# Patient Record
Sex: Female | Born: 1935 | Race: Black or African American | Hispanic: No | Smoking: Never smoker
Health system: Southern US, Community
[De-identification: ages and names within clinical notes are randomized; demographics above are authoritative.]

## PROBLEM LIST (undated history)

## (undated) DIAGNOSIS — I639 Cerebral infarction, unspecified: Secondary | ICD-10-CM

## (undated) DIAGNOSIS — Z950 Presence of cardiac pacemaker: Secondary | ICD-10-CM

## (undated) DIAGNOSIS — F0390 Unspecified dementia without behavioral disturbance: Secondary | ICD-10-CM

## (undated) DIAGNOSIS — I219 Acute myocardial infarction, unspecified: Secondary | ICD-10-CM

## (undated) HISTORY — PX: OTHER SURGICAL HISTORY: SHX169

## (undated) HISTORY — PX: TUBAL LIGATION: SHX77

## (undated) HISTORY — PX: ABDOMINAL HYSTERECTOMY: SHX81

## (undated) HISTORY — PX: PACEMAKER INSERTION: SHX728

---

## 2004-10-20 ENCOUNTER — Ambulatory Visit: Payer: Self-pay | Admitting: Internal Medicine

## 2005-11-17 ENCOUNTER — Ambulatory Visit: Payer: Self-pay | Admitting: Internal Medicine

## 2006-08-08 ENCOUNTER — Emergency Department: Payer: Self-pay | Admitting: Emergency Medicine

## 2006-11-21 ENCOUNTER — Ambulatory Visit: Payer: Self-pay | Admitting: Internal Medicine

## 2007-11-27 ENCOUNTER — Ambulatory Visit: Payer: Self-pay | Admitting: Internal Medicine

## 2008-10-13 ENCOUNTER — Inpatient Hospital Stay: Payer: Self-pay | Admitting: Internal Medicine

## 2008-12-18 ENCOUNTER — Ambulatory Visit: Payer: Self-pay | Admitting: Internal Medicine

## 2009-05-31 ENCOUNTER — Inpatient Hospital Stay: Payer: Self-pay | Admitting: Internal Medicine

## 2009-11-10 ENCOUNTER — Inpatient Hospital Stay: Payer: Self-pay | Admitting: Internal Medicine

## 2010-01-31 ENCOUNTER — Inpatient Hospital Stay: Payer: Self-pay | Admitting: Internal Medicine

## 2010-03-24 ENCOUNTER — Ambulatory Visit: Payer: Self-pay | Admitting: Internal Medicine

## 2010-03-26 ENCOUNTER — Ambulatory Visit: Payer: Self-pay | Admitting: Internal Medicine

## 2011-07-08 ENCOUNTER — Ambulatory Visit: Payer: Self-pay | Admitting: Internal Medicine

## 2012-01-10 ENCOUNTER — Encounter (HOSPITAL_COMMUNITY): Payer: Self-pay | Admitting: *Deleted

## 2012-01-10 ENCOUNTER — Emergency Department (HOSPITAL_COMMUNITY): Payer: Medicare Other

## 2012-01-10 ENCOUNTER — Emergency Department (HOSPITAL_COMMUNITY)
Admission: EM | Admit: 2012-01-10 | Discharge: 2012-01-10 | Disposition: A | Discharge status: Home / Self Care | Payer: Medicare Other | Attending: Emergency Medicine | Admitting: Emergency Medicine

## 2012-01-10 DIAGNOSIS — I252 Old myocardial infarction: Secondary | ICD-10-CM | POA: Insufficient documentation

## 2012-01-10 DIAGNOSIS — W19XXXA Unspecified fall, initial encounter: Secondary | ICD-10-CM

## 2012-01-10 DIAGNOSIS — F068 Other specified mental disorders due to known physiological condition: Secondary | ICD-10-CM | POA: Insufficient documentation

## 2012-01-10 DIAGNOSIS — Z79899 Other long term (current) drug therapy: Secondary | ICD-10-CM | POA: Insufficient documentation

## 2012-01-10 DIAGNOSIS — F29 Unspecified psychosis not due to a substance or known physiological condition: Secondary | ICD-10-CM | POA: Insufficient documentation

## 2012-01-10 DIAGNOSIS — R51 Headache: Secondary | ICD-10-CM | POA: Insufficient documentation

## 2012-01-10 DIAGNOSIS — S0083XA Contusion of other part of head, initial encounter: Secondary | ICD-10-CM | POA: Insufficient documentation

## 2012-01-10 DIAGNOSIS — Z950 Presence of cardiac pacemaker: Secondary | ICD-10-CM | POA: Insufficient documentation

## 2012-01-10 DIAGNOSIS — Z8673 Personal history of transient ischemic attack (TIA), and cerebral infarction without residual deficits: Secondary | ICD-10-CM | POA: Insufficient documentation

## 2012-01-10 DIAGNOSIS — S0093XA Contusion of unspecified part of head, initial encounter: Secondary | ICD-10-CM

## 2012-01-10 DIAGNOSIS — E119 Type 2 diabetes mellitus without complications: Secondary | ICD-10-CM | POA: Insufficient documentation

## 2012-01-10 DIAGNOSIS — S0003XA Contusion of scalp, initial encounter: Secondary | ICD-10-CM | POA: Insufficient documentation

## 2012-01-10 DIAGNOSIS — W07XXXA Fall from chair, initial encounter: Secondary | ICD-10-CM | POA: Insufficient documentation

## 2012-01-10 HISTORY — DX: Acute myocardial infarction, unspecified: I21.9

## 2012-01-10 HISTORY — DX: Cerebral infarction, unspecified: I63.9

## 2012-01-10 HISTORY — DX: Presence of cardiac pacemaker: Z95.0

## 2012-01-10 HISTORY — DX: Unspecified dementia, unspecified severity, without behavioral disturbance, psychotic disturbance, mood disturbance, and anxiety: F03.90

## 2012-01-10 NOTE — ED Provider Notes (Signed)
History     CSN: [id number]  Arrival date & time 01/10/12  1237   First MD Initiated Contact with Patient 01/10/12 1256      Chief Complaint  Patient presents with  . Fall    (Consider location/radiation/quality/duration/timing/severity/associated sxs/prior treatment) Patient is a 78 y.o. female presenting with fall. The history is provided by the patient and a relative.  Fall The accident occurred 3 to 5 hours ago. Incident: She fell backward while sitting in a seated walker going over uneven surface.  The point of impact was the head. The pain is present in the head. She was ambulatory at the scene. There was no entrapment after the fall. There was no drug use involved in the accident. There was no alcohol use involved in the accident. Associated symptoms include headaches. Pertinent negatives include no abdominal pain. Associated symptoms comments: Per family member at bedside, she did not lose consciousness and she denies nausea. There was some confusion after the fall that has resolved since arrival in ED and family feels she is at baseline now. She complains of mild residual headache..    Past Medical History  Diagnosis Date  . Dementia   . Stroke   . MI (myocardial infarction)   . Diabetes mellitus   . Pacemaker     History reviewed. No pertinent past surgical history.  No family history on file.  History  Substance Use Topics  . Smoking status: Never Smoker   . Smokeless tobacco: Not on file  . Alcohol Use: No    OB History    Grav Para Term Preterm Abortions TAB SAB Ect Mult Living                  Review of Systems  Constitutional: Negative.   HENT: Negative for neck pain.   Respiratory: Negative for shortness of breath.   Cardiovascular: Negative for chest pain.  Gastrointestinal: Negative for abdominal pain.  Musculoskeletal: Negative for back pain.  Neurological: Positive for headaches.  Psychiatric/Behavioral: Positive for confusion.    Allergies   Review of patient's allergies indicates no known allergies.  Home Medications   Current Outpatient Rx  Name Route Sig Dispense Refill  . AMLODIPINE BESYLATE 5 MG PO TABS Oral Take 5 mg by mouth daily.    Marland Kitchen BRINZOLAMIDE 1 % OP SUSP Both Eyes Place 1 drop into both eyes 2 (two) times daily.    . ASPIRIN-DIPYRIDAMOLE ER 25-200 MG PO CP12 Oral Take 1 capsule by mouth 2 (two) times daily.    Marland Kitchen GABAPENTIN 100 MG PO CAPS Oral Take 100 mg by mouth at bedtime.    Marland Kitchen LISINOPRIL 40 MG PO TABS Oral Take 40 mg by mouth daily.    Marland Kitchen METFORMIN HCL 500 MG PO TABS Oral Take 500 mg by mouth 2 (two) times daily.    Marland Kitchen METOPROLOL SUCCINATE ER 50 MG PO TB24 Oral Take 50 mg by mouth daily. Take with or immediately following a meal.    . OMEPRAZOLE 20 MG PO CPDR Oral Take 20 mg by mouth daily.    Marland Kitchen POTASSIUM CHLORIDE CRYS ER 10 MEQ PO TBCR Oral Take 10 mEq by mouth daily.    Marland Kitchen SIMVASTATIN 20 MG PO TABS Oral Take 20 mg by mouth at bedtime.      BP 128/63  Pulse 77  Temp(Src) 99 F (37.2 C) (Oral)  Resp 11  SpO2 99%  Physical Exam  Constitutional: She is oriented to person, place, and time. She appears  well-developed and well-nourished.  HENT:  Head: Normocephalic.  Eyes: Pupils are equal, round, and reactive to light.  Neck: Normal range of motion. Neck supple.  Cardiovascular: Normal rate and regular rhythm.   No murmur heard. Pulmonary/Chest: Effort normal and breath sounds normal. She has no rales.  Abdominal: Soft. Bowel sounds are normal. There is no tenderness. There is no rebound and no guarding.  Musculoskeletal: Normal range of motion.  Neurological: She is alert and oriented to person, place, and time. She has normal strength and normal reflexes. No sensory deficit. She displays a negative Romberg sign. Coordination normal.       Cranial nerves 3-12 grossly intact.  Skin: Skin is warm and dry. No rash noted.  Psychiatric: She has a normal mood and affect.    ED Course  Procedures  (including critical care time)  Labs Reviewed - No data to display Ct Head Wo Contrast  01/10/2012  *RADIOLOGY REPORT*  Clinical Data: Larey Seat and struck head.  CT HEAD WITHOUT CONTRAST  Technique:  Contiguous axial images were obtained from the base of the skull through the vertex without contrast.  Comparison: No comparison studies available.  Findings: There is no evidence for acute hemorrhage, hydrocephalus, mass lesion, or abnormal extra-axial fluid collection.  No definite CT evidence for acute infarction.  Patchy low attenuation in the deep hemispheric and periventricular white matter is nonspecific, but likely reflects chronic microvascular ischemic demyelination.  Air-fluid level is identified in the left maxillary sinus.  IMPRESSION: No acute intracranial abnormality.  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  Air-fluid level of the left maxillary sinus.  This may be secondary to acute sinusitis.  Hemorrhage would also be a consideration.  No fracture is apparent within the visualized portion of the left maxillary sinus.  Original Report Authenticated By: ERIC A. MANSELL, M.D.     No diagnosis found. 1. Contusion, head 2. Fall    MDM  Patient remains oriented without development of new symptoms, in the setting of a normal neuro exam and a negative head CT. Feel injury is minimal and she can be discharged home at this time.         Rodena Medin, PA-C 01/10/12 1520

## 2012-01-10 NOTE — Discharge Instructions (Signed)
FOLLOW UP WITH YOUR DOCTOR FOR RECHECK AS NEEDED. TYLENOL FOR ANY DISCOMFORT. RETURN HERE WITH ANY NEW OR CONCERNING SYMPTOMS.  Contusion A contusion is a deep bruise. Contusions are the result of an injury that caused bleeding under the skin. The contusion may turn blue, purple, or yellow. Minor injuries will give you a painless contusion, but more severe contusions may stay painful and swollen for a few weeks.  CAUSES  A contusion is usually caused by a blow, trauma, or direct force to an area of the body. SYMPTOMS   Swelling and redness of the injured area.   Bruising of the injured area.   Tenderness and soreness of the injured area.   Pain.  DIAGNOSIS  The diagnosis can be made by taking a history and physical exam. An X-ray, CT scan, or MRI may be needed to determine if there were any associated injuries, such as fractures. TREATMENT  Specific treatment will depend on what area of the body was injured. In general, the best treatment for a contusion is resting, icing, elevating, and applying cold compresses to the injured area. Over-the-counter medicines may also be recommended for pain control. Ask your caregiver what the best treatment is for your contusion. HOME CARE INSTRUCTIONS   Put ice on the injured area.   Put ice in a plastic bag.   Place a towel between your skin and the bag.   Leave the ice on for 15 to 20 minutes, 3 to 4 times a day.   Only take over-the-counter or prescription medicines for pain, discomfort, or fever as directed by your caregiver. Your caregiver may recommend avoiding anti-inflammatory medicines (aspirin, ibuprofen, and naproxen) for 48 hours because these medicines may increase bruising.   Rest the injured area.   If possible, elevate the injured area to reduce swelling.  SEEK IMMEDIATE MEDICAL CARE IF:   You have increased bruising or swelling.   You have pain that is getting worse.   Your swelling or pain is not relieved with medicines.    MAKE SURE YOU:   Understand these instructions.   Will watch your condition.   Will get help right away if you are not doing well or get worse.  Document Released: 06/09/2005 Document Revised: 08/19/2011 Document Reviewed: 07/05/2011 [facility] Patient Information 2012 [place], [place].

## 2012-01-10 NOTE — ED Notes (Signed)
Patient transported to CT 

## 2012-01-10 NOTE — ED Notes (Signed)
MD at bedside. 

## 2012-01-10 NOTE — ED Provider Notes (Signed)
Medical screening examination/treatment/procedure(s) were performed by non-physician practitioner and as supervising physician I was immediately available for consultation/collaboration.   Nic Lampe A Fortunato Nordin, MD 01/10/12 1618 
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
## 2012-08-27 ENCOUNTER — Emergency Department: Payer: Self-pay | Admitting: Emergency Medicine

## 2012-08-27 LAB — COMPREHENSIVE METABOLIC PANEL
BUN: 10 mg/dL (ref 7–18)
Bilirubin,Total: 0.5 mg/dL (ref 0.2–1.0)
Chloride: 109 mmol/L — ABNORMAL HIGH (ref 98–107)
Co2: 28 mmol/L (ref 21–32)
Creatinine: 0.78 mg/dL (ref 0.60–1.30)
EGFR (African American): >60
EGFR (Non-African Amer.): >60
Osmolality: 284 (ref 275–301)
Sodium: 142 mmol/L (ref 136–145)
Total Protein: 8.7 g/dL — ABNORMAL HIGH (ref 6.4–8.2)

## 2012-08-27 LAB — CBC
MCH: 31.3 pg (ref 26.0–34.0)
MCV: 93 fL (ref 80–100)
Platelet: 375 10*3/uL (ref 150–440)
RDW: 12.7 % (ref 11.5–14.5)
WBC: 6.2 10*3/uL (ref 3.6–11.0)

## 2012-08-27 LAB — URINALYSIS, COMPLETE
Blood: NEGATIVE
Glucose,UR: NEGATIVE mg/dL (ref 0–75)
Nitrite: NEGATIVE
Ph: 5 (ref 4.5–8.0)
Specific Gravity: 1.008 (ref 1.003–1.030)
Squamous Epithelial: 11

## 2013-01-22 ENCOUNTER — Ambulatory Visit: Payer: Self-pay | Admitting: Internal Medicine

## 2013-03-05 ENCOUNTER — Ambulatory Visit: Payer: Self-pay | Admitting: Internal Medicine

## 2015-02-17 ENCOUNTER — Other Ambulatory Visit: Payer: Self-pay | Admitting: Internal Medicine

## 2015-02-17 DIAGNOSIS — Z1231 Encounter for screening mammogram for malignant neoplasm of breast: Secondary | ICD-10-CM

## 2015-02-24 ENCOUNTER — Ambulatory Visit
Admission: RE | Admit: 2015-02-24 | Discharge: 2015-02-24 | Disposition: A | Discharge status: Home / Self Care | Payer: Medicare Other | Source: Ambulatory Visit | Attending: Internal Medicine | Admitting: Internal Medicine

## 2015-02-24 ENCOUNTER — Other Ambulatory Visit: Payer: Self-pay | Admitting: Internal Medicine

## 2015-02-24 DIAGNOSIS — Z1231 Encounter for screening mammogram for malignant neoplasm of breast: Secondary | ICD-10-CM | POA: Insufficient documentation

## 2016-01-28 ENCOUNTER — Emergency Department
Admission: EM | Admit: 2016-01-28 | Discharge: 2016-01-29 | Disposition: A | Discharge status: Home / Self Care | Payer: Medicare Other | Attending: Emergency Medicine | Admitting: Emergency Medicine

## 2016-01-28 ENCOUNTER — Encounter: Payer: Self-pay | Admitting: Emergency Medicine

## 2016-01-28 DIAGNOSIS — I252 Old myocardial infarction: Secondary | ICD-10-CM | POA: Diagnosis not present

## 2016-01-28 DIAGNOSIS — Z7984 Long term (current) use of oral hypoglycemic drugs: Secondary | ICD-10-CM | POA: Insufficient documentation

## 2016-01-28 DIAGNOSIS — Z950 Presence of cardiac pacemaker: Secondary | ICD-10-CM | POA: Insufficient documentation

## 2016-01-28 DIAGNOSIS — Z8673 Personal history of transient ischemic attack (TIA), and cerebral infarction without residual deficits: Secondary | ICD-10-CM | POA: Diagnosis not present

## 2016-01-28 DIAGNOSIS — R531 Weakness: Secondary | ICD-10-CM | POA: Diagnosis present

## 2016-01-28 DIAGNOSIS — E119 Type 2 diabetes mellitus without complications: Secondary | ICD-10-CM | POA: Insufficient documentation

## 2016-01-28 LAB — URINALYSIS COMPLETE WITH MICROSCOPIC (ARMC ONLY)
BILIRUBIN URINE: NEGATIVE
GLUCOSE, UA: NEGATIVE mg/dL
HGB URINE DIPSTICK: NEGATIVE
Ketones, ur: NEGATIVE mg/dL
Nitrite: NEGATIVE
Protein, ur: NEGATIVE mg/dL
Specific Gravity, Urine: 1.018 (ref 1.005–1.030)
pH: 5 (ref 5.0–8.0)

## 2016-01-28 LAB — BASIC METABOLIC PANEL
Anion gap: 10 (ref 5–15)
BUN: 18 mg/dL (ref 6–20)
CALCIUM: 9.5 mg/dL (ref 8.9–10.3)
CO2: 24 mmol/L (ref 22–32)
CREATININE: 1.17 mg/dL — AB (ref 0.44–1.00)
Chloride: 109 mmol/L (ref 101–111)
GFR calc Af Amer: 49 mL/min — ABNORMAL LOW (ref >60)
GFR, EST NON AFRICAN AMERICAN: 42 mL/min — AB (ref >60)
GLUCOSE: 106 mg/dL — AB (ref 65–99)
Potassium: 3.4 mmol/L — ABNORMAL LOW (ref 3.5–5.1)
SODIUM: 143 mmol/L (ref 135–145)

## 2016-01-28 LAB — CBC WITH DIFFERENTIAL/PLATELET
Basophils Absolute: 0.1 10*3/uL (ref 0–0.1)
Basophils Relative: 1 %
EOS ABS: 0.2 10*3/uL (ref 0–0.7)
EOS PCT: 3 %
HCT: 31.9 % — ABNORMAL LOW (ref 35.0–47.0)
Hemoglobin: 10.6 g/dL — ABNORMAL LOW (ref 12.0–16.0)
LYMPHS ABS: 2.9 10*3/uL (ref 1.0–3.6)
LYMPHS PCT: 50 %
MCH: 31.3 pg (ref 26.0–34.0)
MCHC: 33.4 g/dL (ref 32.0–36.0)
MCV: 93.7 fL (ref 80.0–100.0)
MONO ABS: 0.6 10*3/uL (ref 0.2–0.9)
Monocytes Relative: 10 %
Neutro Abs: 2.1 10*3/uL (ref 1.4–6.5)
Neutrophils Relative %: 36 %
PLATELETS: 317 10*3/uL (ref 150–440)
RBC: 3.4 MIL/uL — AB (ref 3.80–5.20)
RDW: 12.4 % (ref 11.5–14.5)
WBC: 5.9 10*3/uL (ref 3.6–11.0)

## 2016-01-28 LAB — TROPONIN I

## 2016-01-28 NOTE — ED Notes (Signed)
Pt comes into the ED via EMS from home c/o generalized weakness and increasing lethargy starting earlier today.  Patient normally can ambulate with a walker by herself but needed a one person assist to be able to get around.  Patient has h/o 2 CVA with no deficits, HTN, DM, and neuropathy.  CBG 113, 106/54, 80 HR, A&Ox4, NSR.
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
## 2016-01-28 NOTE — Discharge Instructions (Signed)
Please seek medical attention for any high fevers, chest pain, shortness of breath, change in behavior, persistent vomiting, bloody stool or any other new or concerning symptoms. ° °Weakness °Weakness is a lack of strength. You may feel weak all over your body or just in one part of your body. Weakness can be serious. In some cases, you may need more medical tests. °HOME CARE °· Rest. °· Eat a well-balanced diet. °· Try to exercise every day. °· Only take medicines as told by your doctor. °GET HELP RIGHT AWAY IF:  °· You cannot do your normal daily activities. °· You cannot walk up and down stairs, or you feel very tired when you do so. °· You have shortness of breath or chest pain. °· You have trouble moving parts of your body. °· You have weakness in only one body part or on only one side of the body. °· You have a fever. °· You have trouble speaking or swallowing. °· You cannot control when you pee (urinate) or poop (bowel movement). °· You have black or bloody throw up (vomit) or poop. °· Your weakness gets worse or spreads to other body parts. °· You have new aches or pains. °MAKE SURE YOU:  °· Understand these instructions. °· Will watch your condition. °· Will get help right away if you are not doing well or get worse. °  °This information is not intended to replace advice given to you by your health care provider. Make sure you discuss any questions you have with your health care provider. °  °Document Released: 08/12/2008 Document Revised: 02/29/2012 Document Reviewed: 10/29/2011 °Elsevier Interactive Patient Education ©2016 [employer] ° ° °

## 2016-01-29 NOTE — ED Provider Notes (Signed)
-----------------------------------------   12:07 AM on 01/29/2016 -----------------------------------------  Updated patient and family members of urinalysis results. Will add urine culture. Strict return precautions given. All verbalize understanding and agree with plan of care.  Irean HongJade J Valma Rotenberg, MD 01/29/16 [phone]

## 2016-01-30 LAB — URINE CULTURE: SPECIAL REQUESTS: NORMAL

## 2016-04-05 ENCOUNTER — Other Ambulatory Visit: Payer: Self-pay | Admitting: Internal Medicine

## 2016-04-05 DIAGNOSIS — Z1231 Encounter for screening mammogram for malignant neoplasm of breast: Secondary | ICD-10-CM

## 2016-05-03 ENCOUNTER — Ambulatory Visit: Payer: Medicare Other

## 2016-05-11 ENCOUNTER — Ambulatory Visit: Payer: Medicare Other

## 2016-05-24 ENCOUNTER — Ambulatory Visit
Admission: RE | Admit: 2016-05-24 | Discharge: 2016-05-24 | Disposition: A | Discharge status: Home / Self Care | Payer: Medicare Other | Source: Ambulatory Visit | Attending: Internal Medicine | Admitting: Internal Medicine

## 2016-05-24 ENCOUNTER — Other Ambulatory Visit: Payer: Self-pay | Admitting: Internal Medicine

## 2016-05-24 DIAGNOSIS — Z1231 Encounter for screening mammogram for malignant neoplasm of breast: Secondary | ICD-10-CM

## 2016-09-17 ENCOUNTER — Emergency Department: Payer: Medicare Other

## 2016-09-17 ENCOUNTER — Observation Stay
Admission: EM | Admit: 2016-09-17 | Discharge: 2016-09-18 | Disposition: A | Discharge status: Home / Self Care | Payer: Medicare Other | Attending: Internal Medicine | Admitting: Internal Medicine

## 2016-09-17 DIAGNOSIS — I10 Essential (primary) hypertension: Secondary | ICD-10-CM | POA: Diagnosis not present

## 2016-09-17 DIAGNOSIS — Z8673 Personal history of transient ischemic attack (TIA), and cerebral infarction without residual deficits: Secondary | ICD-10-CM | POA: Insufficient documentation

## 2016-09-17 DIAGNOSIS — E860 Dehydration: Secondary | ICD-10-CM | POA: Insufficient documentation

## 2016-09-17 DIAGNOSIS — R778 Other specified abnormalities of plasma proteins: Secondary | ICD-10-CM

## 2016-09-17 DIAGNOSIS — K449 Diaphragmatic hernia without obstruction or gangrene: Secondary | ICD-10-CM | POA: Insufficient documentation

## 2016-09-17 DIAGNOSIS — J209 Acute bronchitis, unspecified: Secondary | ICD-10-CM | POA: Diagnosis not present

## 2016-09-17 DIAGNOSIS — I252 Old myocardial infarction: Secondary | ICD-10-CM | POA: Insufficient documentation

## 2016-09-17 DIAGNOSIS — Z950 Presence of cardiac pacemaker: Secondary | ICD-10-CM | POA: Diagnosis not present

## 2016-09-17 DIAGNOSIS — R059 Cough, unspecified: Secondary | ICD-10-CM

## 2016-09-17 DIAGNOSIS — R531 Weakness: Secondary | ICD-10-CM | POA: Diagnosis not present

## 2016-09-17 DIAGNOSIS — Z7984 Long term (current) use of oral hypoglycemic drugs: Secondary | ICD-10-CM | POA: Insufficient documentation

## 2016-09-17 DIAGNOSIS — I2510 Atherosclerotic heart disease of native coronary artery without angina pectoris: Secondary | ICD-10-CM | POA: Insufficient documentation

## 2016-09-17 DIAGNOSIS — R7989 Other specified abnormal findings of blood chemistry: Secondary | ICD-10-CM | POA: Diagnosis not present

## 2016-09-17 DIAGNOSIS — R05 Cough: Secondary | ICD-10-CM | POA: Diagnosis present

## 2016-09-17 DIAGNOSIS — N179 Acute kidney failure, unspecified: Secondary | ICD-10-CM | POA: Diagnosis not present

## 2016-09-17 DIAGNOSIS — R079 Chest pain, unspecified: Secondary | ICD-10-CM | POA: Diagnosis present

## 2016-09-17 DIAGNOSIS — I700 Atherosclerosis of aorta: Secondary | ICD-10-CM | POA: Diagnosis not present

## 2016-09-17 DIAGNOSIS — E1140 Type 2 diabetes mellitus with diabetic neuropathy, unspecified: Secondary | ICD-10-CM | POA: Insufficient documentation

## 2016-09-17 DIAGNOSIS — F0390 Unspecified dementia without behavioral disturbance: Secondary | ICD-10-CM | POA: Diagnosis not present

## 2016-09-17 DIAGNOSIS — D649 Anemia, unspecified: Secondary | ICD-10-CM | POA: Diagnosis not present

## 2016-09-17 DIAGNOSIS — N289 Disorder of kidney and ureter, unspecified: Secondary | ICD-10-CM

## 2016-09-17 LAB — CBC WITH DIFFERENTIAL/PLATELET
Basophils Absolute: 0.1 10*3/uL (ref 0–0.1)
Basophils Relative: 1 %
EOS ABS: 0.2 10*3/uL (ref 0–0.7)
EOS PCT: 2 %
HCT: 34.6 % — ABNORMAL LOW (ref 35.0–47.0)
Hemoglobin: 11.9 g/dL — ABNORMAL LOW (ref 12.0–16.0)
LYMPHS ABS: 2.3 10*3/uL (ref 1.0–3.6)
Lymphocytes Relative: 22 %
MCH: 31.7 pg (ref 26.0–34.0)
MCHC: 34.3 g/dL (ref 32.0–36.0)
MCV: 92.5 fL (ref 80.0–100.0)
MONO ABS: 1.1 10*3/uL — AB (ref 0.2–0.9)
MONOS PCT: 10 %
Neutro Abs: 7 10*3/uL — ABNORMAL HIGH (ref 1.4–6.5)
Neutrophils Relative %: 65 %
PLATELETS: 351 10*3/uL (ref 150–440)
RBC: 3.74 MIL/uL — ABNORMAL LOW (ref 3.80–5.20)
RDW: 12.3 % (ref 11.5–14.5)
WBC: 10.7 10*3/uL (ref 3.6–11.0)

## 2016-09-17 LAB — BASIC METABOLIC PANEL
Anion gap: 8 (ref 5–15)
BUN: 26 mg/dL — AB (ref 6–20)
CALCIUM: 9.8 mg/dL (ref 8.9–10.3)
CO2: 27 mmol/L (ref 22–32)
CREATININE: 1.41 mg/dL — AB (ref 0.44–1.00)
Chloride: 102 mmol/L (ref 101–111)
GFR calc Af Amer: 38 mL/min — ABNORMAL LOW (ref >60)
GFR, EST NON AFRICAN AMERICAN: 33 mL/min — AB (ref >60)
GLUCOSE: 267 mg/dL — AB (ref 65–99)
Potassium: 4.1 mmol/L (ref 3.5–5.1)
SODIUM: 137 mmol/L (ref 135–145)

## 2016-09-17 LAB — RAPID INFLUENZA A&B ANTIGENS
Influenza A (ARMC): NEGATIVE
Influenza B (ARMC): NEGATIVE

## 2016-09-17 LAB — BRAIN NATRIURETIC PEPTIDE: B NATRIURETIC PEPTIDE 5: 41 pg/mL (ref 0.0–100.0)

## 2016-09-17 LAB — TROPONIN I: Troponin I: 0.05 ng/mL (ref <0.03)

## 2016-09-17 MED ORDER — ONDANSETRON HCL 4 MG/2ML IJ SOLN: 4.0000 mg | Freq: Four times a day (QID) | INTRAMUSCULAR | Status: DC | PRN

## 2016-09-17 MED ORDER — LATANOPROST 0.005 % OP SOLN
1.0000 [drp] | Freq: Every day | OPHTHALMIC | Status: DC
  Administered 2016-09-17: 1 [drp] via OPHTHALMIC
  Filled 2016-09-17: qty 2.5

## 2016-09-17 MED ORDER — PANTOPRAZOLE SODIUM 40 MG PO TBEC
40.0000 mg | DELAYED_RELEASE_TABLET | Freq: Every day | ORAL | Status: DC
  Administered 2016-09-18: 40 mg via ORAL
  Filled 2016-09-17: qty 1

## 2016-09-17 MED ORDER — VITAMIN B-12 1000 MCG PO TABS
500.0000 ug | ORAL_TABLET | Freq: Two times a day (BID) | ORAL | Status: DC
  Administered 2016-09-17 – 2016-09-18 (×2): 500 ug via ORAL
  Filled 2016-09-17 (×2): qty 1

## 2016-09-17 MED ORDER — GLIPIZIDE ER 5 MG PO TB24
5.0000 mg | ORAL_TABLET | Freq: Every day | ORAL | Status: DC
  Administered 2016-09-18: 5 mg via ORAL
  Filled 2016-09-17: qty 1

## 2016-09-17 MED ORDER — ALPRAZOLAM 0.5 MG PO TABS
0.2500 mg | ORAL_TABLET | Freq: Every day | ORAL | Status: DC
  Administered 2016-09-17 – 2016-09-18 (×2): 0.25 mg via ORAL
  Filled 2016-09-17 (×2): qty 1

## 2016-09-17 MED ORDER — AMLODIPINE BESYLATE 5 MG PO TABS
5.0000 mg | ORAL_TABLET | Freq: Every day | ORAL | Status: DC
  Administered 2016-09-17 – 2016-09-18 (×2): 5 mg via ORAL
  Filled 2016-09-17: qty 1

## 2016-09-17 MED ORDER — SIMVASTATIN 20 MG PO TABS
20.0000 mg | ORAL_TABLET | Freq: Every day | ORAL | Status: DC
Start: 2016-09-17 — End: 2016-09-18
  Administered 2016-09-17: 20 mg via ORAL
  Filled 2016-09-17: qty 1

## 2016-09-17 MED ORDER — ENOXAPARIN SODIUM 40 MG/0.4ML ~~LOC~~ SOLN: 40.0000 mg | SUBCUTANEOUS | Status: DC

## 2016-09-17 MED ORDER — POTASSIUM CHLORIDE CRYS ER 20 MEQ PO TBCR
EXTENDED_RELEASE_TABLET | ORAL | Status: AC
  Filled 2016-09-17: qty 1

## 2016-09-17 MED ORDER — PANTOPRAZOLE SODIUM 40 MG PO TBEC
DELAYED_RELEASE_TABLET | ORAL | Status: AC
  Filled 2016-09-17: qty 1

## 2016-09-17 MED ORDER — ONDANSETRON HCL 4 MG PO TABS: 4.0000 mg | ORAL_TABLET | Freq: Four times a day (QID) | ORAL | Status: DC | PRN

## 2016-09-17 MED ORDER — ACETAMINOPHEN 325 MG PO TABS: 650.0000 mg | ORAL_TABLET | Freq: Four times a day (QID) | ORAL | Status: DC | PRN

## 2016-09-17 MED ORDER — AMLODIPINE BESYLATE 5 MG PO TABS
ORAL_TABLET | ORAL | Status: AC
  Filled 2016-09-17: qty 1

## 2016-09-17 MED ORDER — AZITHROMYCIN 250 MG PO TABS
500.0000 mg | ORAL_TABLET | Freq: Every day | ORAL | Status: DC
  Administered 2016-09-17 – 2016-09-18 (×2): 500 mg via ORAL
  Filled 2016-09-17 (×2): qty 2

## 2016-09-17 MED ORDER — VITAMIN D 1000 UNITS PO TABS
2000.0000 [IU] | ORAL_TABLET | Freq: Every day | ORAL | Status: DC
Start: 2016-09-17 — End: 2016-09-18
  Administered 2016-09-18: 2000 [IU] via ORAL
  Filled 2016-09-17 (×2): qty 2

## 2016-09-17 MED ORDER — GABAPENTIN 100 MG PO CAPS
100.0000 mg | ORAL_CAPSULE | Freq: Every day | ORAL | Status: DC
  Administered 2016-09-17: 100 mg via ORAL
  Filled 2016-09-17: qty 1

## 2016-09-17 MED ORDER — B-12 500 MCG PO TABS: 500.0000 ug | ORAL_TABLET | Freq: Two times a day (BID) | ORAL | Status: DC

## 2016-09-17 MED ORDER — METOPROLOL SUCCINATE ER 50 MG PO TB24
ORAL_TABLET | ORAL | Status: AC
  Filled 2016-09-17: qty 1

## 2016-09-17 MED ORDER — DOCUSATE SODIUM 100 MG PO CAPS
100.0000 mg | ORAL_CAPSULE | Freq: Two times a day (BID) | ORAL | Status: DC
  Administered 2016-09-17 – 2016-09-18 (×2): 100 mg via ORAL
  Filled 2016-09-17 (×2): qty 1

## 2016-09-17 MED ORDER — NITROGLYCERIN 2 % TD OINT
1.0000 [in_us] | TOPICAL_OINTMENT | Freq: Once | TRANSDERMAL | Status: AC
  Administered 2016-09-17: 1 [in_us] via TOPICAL
  Filled 2016-09-17: qty 1

## 2016-09-17 MED ORDER — ASPIRIN-DIPYRIDAMOLE ER 25-200 MG PO CP12
1.0000 | ORAL_CAPSULE | Freq: Two times a day (BID) | ORAL | Status: DC
  Administered 2016-09-17 – 2016-09-18 (×2): 1 via ORAL
  Filled 2016-09-17 (×2): qty 1

## 2016-09-17 MED ORDER — BRINZOLAMIDE 1 % OP SUSP
1.0000 [drp] | Freq: Two times a day (BID) | OPHTHALMIC | Status: DC
  Administered 2016-09-17 – 2016-09-18 (×2): 1 [drp] via OPHTHALMIC
  Filled 2016-09-17: qty 10

## 2016-09-17 MED ORDER — LISINOPRIL 10 MG PO TABS
40.0000 mg | ORAL_TABLET | Freq: Every day | ORAL | Status: DC
  Administered 2016-09-17 – 2016-09-18 (×2): 40 mg via ORAL
  Filled 2016-09-17 (×2): qty 4

## 2016-09-17 MED ORDER — SODIUM CHLORIDE 0.9 % IV SOLN
INTRAVENOUS | Status: DC
  Administered 2016-09-17: 19:00:00 via INTRAVENOUS

## 2016-09-17 MED ORDER — BISACODYL 5 MG PO TBEC: 5.0000 mg | DELAYED_RELEASE_TABLET | Freq: Every day | ORAL | Status: DC | PRN

## 2016-09-17 MED ORDER — ACETAMINOPHEN 650 MG RE SUPP: 650.0000 mg | Freq: Four times a day (QID) | RECTAL | Status: DC | PRN

## 2016-09-17 MED ORDER — IOPAMIDOL (ISOVUE-370) INJECTION 76%
60.0000 mL | Freq: Once | INTRAVENOUS | Status: AC | PRN
  Administered 2016-09-17: 60 mL via INTRAVENOUS

## 2016-09-17 MED ORDER — PNEUMOCOCCAL VAC POLYVALENT 25 MCG/0.5ML IJ INJ
0.5000 mL | INJECTION | INTRAMUSCULAR | Status: DC
  Filled 2016-09-17: qty 0.5

## 2016-09-17 MED ORDER — METOPROLOL SUCCINATE ER 50 MG PO TB24
50.0000 mg | ORAL_TABLET | Freq: Every day | ORAL | Status: DC
  Administered 2016-09-17 – 2016-09-18 (×2): 50 mg via ORAL
  Filled 2016-09-17: qty 1

## 2016-09-17 MED ORDER — POTASSIUM CHLORIDE CRYS ER 20 MEQ PO TBCR
10.0000 meq | EXTENDED_RELEASE_TABLET | Freq: Every day | ORAL | Status: DC
  Administered 2016-09-17 – 2016-09-18 (×2): 10 meq via ORAL
  Filled 2016-09-17: qty 1

## 2016-09-17 MED ORDER — ASPIRIN 81 MG PO CHEW
324.0000 mg | CHEWABLE_TABLET | Freq: Once | ORAL | Status: AC
  Administered 2016-09-17: 324 mg via ORAL
  Filled 2016-09-17: qty 4

## 2016-09-17 NOTE — H&P (Signed)
[facility] [place] [facility]   PATIENT NAME: Jessica Gardner    MR#:  [id number]  DATE OF BIRTH:  1930-09-09  DATE OF ADMISSION:  09/17/2016  PRIMARY CARE PHYSICIAN: Barbette Reichmann, MD   REQUESTING/REFERRING PHYSICIAN: Dr. Christiane Ha milium.  CHIEF COMPLAINT:   Chief Complaint  Patient presents with  . Cough    HISTORY OF PRESENT ILLNESS:  Radiance Deady  is a 83 y.o. female with a known history of Essential hypertension, diabetes mellitus type 2, pacemaker, mild dementia came in because of the cough. Patient has dry cough for 2 days with trouble breathing. Also has some runny nose. No fever, no chest pain. No nausea or vomiting. Troponin up to 0.05. Family wanted the patient overnight observation because of slightly elevated troponin. He had physician tried to discharge but there is very concerned about troponin abnormality.  PAST MEDICAL HISTORY:   Past Medical History:  Diagnosis Date  . Dementia   . Diabetes mellitus   . MI (myocardial infarction)   . Pacemaker   . Stroke [facility])     PAST SURGICAL HISTOIRY:   Past Surgical History:  Procedure Laterality Date  . PACEMAKER INSERTION      SOCIAL HISTORY:   Social History  Substance Use Topics  . Smoking status: Never Smoker  . Smokeless tobacco: Never Used  . Alcohol use No    FAMILY HISTORY:   Family History  Problem Relation Age of Onset  . Breast cancer Sister 28    DRUG ALLERGIES:  No Known Allergies  REVIEW OF SYSTEMS:  CONSTITUTIONAL: No fever, fatigue or weakness.  EYES: No blurred or double vision.  EARS, NOSE, AND THROAT: No tinnitus or ear pain.  RESPIRATORY: No cough, shortness of breath, wheezing or hemoptysis.  CARDIOVASCULAR: No chest pain, orthopnea, edema.  GASTROINTESTINAL: No nausea, vomiting, diarrhea or abdominal pain.  GENITOURINARY: No dysuria, hematuria.  ENDOCRINE: No polyuria, nocturia,  HEMATOLOGY: No anemia, easy bruising or bleeding SKIN: No  rash or lesion. MUSCULOSKELETAL: No joint pain or arthritis.   NEUROLOGIC: No tingling, numbness, weakness.  PSYCHIATRY: No anxiety or depression.   MEDICATIONS AT HOME:   Prior to Admission medications   Medication Sig Start Date End Date Taking? Authorizing Provider  ALPRAZolam (XANAX) 0.25 MG tablet Take 0.25 mg by mouth daily. 09/14/16  Yes Historical Provider, MD  amLODipine (NORVASC) 5 MG tablet Take 5 mg by mouth daily.   Yes Historical Provider, MD  brinzolamide (AZOPT) 1 % ophthalmic suspension Place 1 drop into both eyes 2 (two) times daily.   Yes Historical Provider, MD  Cholecalciferol (VITAMIN D3) 2000 units capsule Take 2,000 Units by mouth daily.   Yes Historical Provider, MD  Cyanocobalamin (B-12) 500 MCG TABS Take 500 mcg by mouth 2 (two) times daily. 10/14/14  Yes Historical Provider, MD  dipyridamole-aspirin (AGGRENOX) 25-200 MG per 12 hr capsule Take 1 capsule by mouth 2 (two) times daily.   Yes Historical Provider, MD  gabapentin (NEURONTIN) 100 MG capsule Take 100 mg by mouth at bedtime.   Yes Historical Provider, MD  glipiZIDE (GLUCOTROL XL) 5 MG 24 hr tablet Take 5 mg by mouth daily. 08/25/16  Yes Historical Provider, MD  lisinopril (PRINIVIL,ZESTRIL) 40 MG tablet Take 40 mg by mouth daily.   Yes Historical Provider, MD  LUMIGAN 0.01 % SOLN Place 1 drop into both eyes daily. 09/11/16  Yes Historical Provider, MD  metFORMIN (GLUCOPHAGE) 500 MG tablet Take 500 mg by mouth 2 (two) times daily.  Yes Historical Provider, MD  metoprolol succinate (TOPROL-XL) 50 MG 24 hr tablet Take 50 mg by mouth daily. Take with or immediately following a meal.   Yes Historical Provider, MD  omeprazole (PRILOSEC) 20 MG capsule Take 20 mg by mouth daily.   Yes Historical Provider, MD  potassium chloride (K-DUR,KLOR-CON) 10 MEQ tablet Take 10 mEq by mouth daily.   Yes Historical Provider, MD  simvastatin (ZOCOR) 20 MG tablet Take 20 mg by mouth at bedtime.   Yes Historical Provider, MD       VITAL SIGNS:  Blood pressure 98/64, pulse (!) 109, temperature 99.6 F (37.6 C), temperature source Oral, resp. rate 14, height 5\' 1"  (1.549 m), weight 62.6 kg (138 lb), SpO2 96 %.  PHYSICAL EXAMINATION:  GENERAL:  83 y.o.-year-old patient lying in the bed with no acute distress.  EYES: Pupils equal, round, reactive to light and accommodation. No scleral icterus. Extraocular muscles intact.  HEENT: Head atraumatic, normocephalic. Oropharynx and nasopharynx clear.  NECK:  Supple, no jugular venous distention. No thyroid enlargement, no tenderness.  LUNGS: Normal breath sounds bilaterally, no wheezing, rales,rhonchi or crepitation. No use of accessory muscles of respiration.  CARDIOVASCULAR: S1, S2 normal. No murmurs, rubs, or gallops.  ABDOMEN: Soft, nontender, nondistended. Bowel sounds present. No organomegaly or mass.  EXTREMITIES: No pedal edema, cyanosis, or clubbing.  NEUROLOGIC: Cranial nerves II through XII are intact. Muscle strength 5/5 in all extremities. Sensation intact. Gait not checked.  PSYCHIATRIC: The patient is alert and oriented x 3.  SKIN: No obvious rash, lesion, or ulcer.   LABORATORY PANEL:   CBC  Recent Labs Lab 09/17/16 1321  WBC 10.7  HGB 11.9*  HCT 34.6*  PLT 351   ------------------------------------------------------------------------------------------------------------------  Chemistries   Recent Labs Lab 09/17/16 1321  NA 137  K 4.1  CL 102  CO2 27  GLUCOSE 267*  BUN 26*  CREATININE 1.41*  CALCIUM 9.8   ------------------------------------------------------------------------------------------------------------------  Cardiac Enzymes  Recent Labs Lab 09/17/16 1321  TROPONINI 0.05*   ------------------------------------------------------------------------------------------------------------------  RADIOLOGY:  Dg Chest 2 View  Result Date: 09/17/2016 CLINICAL DATA:  Dyspnea.  Cough. EXAM: CHEST  2 VIEW COMPARISON:  08/27/2012  chest radiograph. FINDINGS: Stable configuration of 2 lead left subclavian pacemaker. Stable cardiomediastinal silhouette with normal heart size and aortic atherosclerosis. No pneumothorax. No pleural effusion. Lungs appear clear, with no acute consolidative airspace disease and no pulmonary edema. IMPRESSION: No active cardiopulmonary disease. Aortic atherosclerosis. Electronically Signed   By: Delbert PhenixJason A Poff M.D.   On: 09/17/2016 13:42   Ct Angio Chest Pe W And/or Wo Contrast  Result Date: 09/17/2016 CLINICAL DATA:  83 year old female with a history of cough EXAM: CT ANGIOGRAPHY CHEST WITH CONTRAST TECHNIQUE: Multidetector CT imaging of the chest was performed using the standard protocol during bolus administration of intravenous contrast. Multiplanar CT image reconstructions and MIPs were obtained to evaluate the vascular anatomy. CONTRAST:  60 cc Isovue 370 COMPARISON:  No prior chest CT FINDINGS: Cardiovascular: Heart: No cardiomegaly. No pericardial fluid/thickening. Dense calcifications of the mitral annulus. Calcifications of left main, left anterior descending, circumflex, right coronary artery. Cardiac pacing device on left chest wall with leads in place. Aorta: Unremarkable course, caliber, contour of the thoracic aorta. No aneurysm or dissection flap. No periaortic fluid. Pulmonary arteries: No central, lobar, segmental filling defects. Respiratory motion and timing of the bolus contrast limit more distal assessment. Mediastinum/Nodes: Mediastinal lymph nodes are present, none of which are enlarged by CT size criteria. Unremarkable appearance of the thoracic  esophagus. Unremarkable appearance of the thoracic inlet and thyroid. Lungs/Pleura: Central airways are clear. No pleural effusion. No confluent left-sided airspace disease. No pneumothorax. Upper Abdomen: Small hiatal hernia Musculoskeletal: No displaced fracture. Degenerative changes of the spine. Review of the MIP images confirms the above  findings. IMPRESSION: Study is negative for pulmonary emboli. No acute finding. Left main and 3 vessel coronary artery disease. Left chest wall cardiac pacing device. Hiatal hernia. Signed, Yvone Neu. Loreta Ave, DO [facility] Electronically Signed   By: Gilmer Mor D.O.   On: 09/17/2016 15:49    EKG:   Orders placed or performed during the hospital encounter of 09/17/16  . EKG 12-Lead  . EKG 12-Lead  . EKG 12-Lead  . EKG 12-Lead  . ED EKG  . ED EKG   EKG shows ventricular paced rhythm at 1 20 bpm  IMPRESSION AND PLAN:   1. I cough and throughout the itching likely due to acute bronchitis started on azithromycin. #2 slightly elevated troponins without chest pain; family very concerned about cardiac event: Admit to telemetry, cycle troponins, continue aspirin, beta blockers.  #2 history of neuropathy; home medication #3 diabetes mellitus type 2: Continue home medications #4 history of stroke before no focal deficit: Continue Aggrenox. 5. mild dehydration, acute kidney injury with BUN 26 creatinine 1.41 and normal kidney function in May/2017.Marland Kitchen Continue IV hydration. 6..full code   All the records are reviewed and case discussed with ED provider. Management plans discussed with the patient, family and they are in agreement.  CODE STATUS: full  TOTAL TIME TAKING CARE OF THIS PATIENT: .    Katha Hamming M.D on 09/17/2016 at 4:58 PM  Between 7am to 6pm - Pager - [phone]  After 6pm go to [email or web address] - password [email or web address]  [facility]  Office  [phone]  CC: Primary care physician; Barbette Reichmann, MD  Note: This dictation was prepared with Dragon dictation along with smaller phrase technology. Any transcriptional errors that result from this process are unintentional.

## 2016-09-17 NOTE — Progress Notes (Signed)
Patient admitted to unit. Oriented to room, call bell, and staff. Bed in lowest position. Fall safety plan reviewed. Full assessment to Epic. Skin assessment verified with Paulino RilyAshley RN. Telemetry box verification with tele clerk- Box#: [id number]. Will continue to monitor.  Pt wants daughter to set up password and take her wallet home when she returns later tonight.

## 2016-09-17 NOTE — ED Triage Notes (Signed)
Pt to ED from home c/o cough. Per EMS pt has had a dry non productive cough for 2 days and CBG 291. Pt alert and oriented at this time and reports dry cough, with a sore throat, and runny nose. Pt denies pain, NVD, no acute distress at this time.

## 2016-09-17 NOTE — ED Notes (Signed)
Patient transported to CT 

## 2016-09-17 NOTE — ED Notes (Signed)
Critical troponin, MD made aware.

## 2016-09-17 NOTE — ED Notes (Signed)
Report called to taylor rn floor nurse.

## 2016-09-17 NOTE — ED Provider Notes (Signed)
[facility] Emergency Department Provider Note        Time seen: ----------------------------------------- 1:25 PM on 09/17/2016 -----------------------------------------    I have reviewed the triage vital signs and the nursing notes.   HISTORY  Chief Complaint Cough    HPI Jessica Gardner is a 83 y.o. female who presents to the ER for persistent cough. According to EMS she's had a dry cough for 2 days with trouble breathing. Patient reports sore throat and runny nose as well. They have not reported fever or chest pain, they also deny vomiting or diarrhea.   Past Medical History:  Diagnosis Date  . Dementia   . Diabetes mellitus   . MI (myocardial infarction)   . Pacemaker   . Stroke [facility])     There are no active problems to display for this patient.   Past Surgical History:  Procedure Laterality Date  . PACEMAKER INSERTION      Allergies Patient has no known allergies.  Social History Social History  Substance Use Topics  . Smoking status: Never Smoker  . Smokeless tobacco: Never Used  . Alcohol use No    Review of Systems Constitutional: Negative for fever. Cardiovascular: Negative for chest pain. Respiratory: Negative for shortness of breath.Positive for cough Gastrointestinal: Negative for abdominal pain, vomiting and diarrhea. Skin: Negative for rash. Neurological: Negative for headaches, focal weakness or numbness.  10-point ROS otherwise negative.  ____________________________________________   PHYSICAL EXAM:  VITAL SIGNS: ED Triage Vitals  Enc Vitals Group     BP 09/17/16 1254 (!) 120/58     Pulse Rate 09/17/16 1254 (!) 122     Resp 09/17/16 1254 20     Temp 09/17/16 1254 99.6 F (37.6 C)     Temp Source 09/17/16 1254 Oral     SpO2 09/17/16 1254 96 %     Weight 09/17/16 1255 138 lb (62.6 kg)     Height 09/17/16 1255 5\' 1"  (1.549 m)     Head Circumference --      Peak Flow --      Pain Score --    Pain Loc --      Pain Edu? --      Excl. in GC? --     Constitutional: Alert and oriented. Well appearing and in no distress. Eyes: Conjunctivae are normal. PERRL. Normal extraocular movements. ENT   Head: Normocephalic and atraumatic.   Nose: No congestion/rhinnorhea.   Mouth/Throat: Mucous membranes are moist.   Neck: No stridor. Cardiovascular: Normal rate, regular rhythm. No murmurs, rubs, or gallops. Respiratory: Normal respiratory effort without tachypnea nor retractions. Scattered rhonchi Gastrointestinal: Soft and nontender. Normal bowel sounds Musculoskeletal: Nontender with normal range of motion in all extremities. No lower extremity tenderness nor edema. Neurologic:  Normal speech and language. No gross focal neurologic deficits are appreciated.  Skin:  Skin is warm, dry and intact. No rash noted. Psychiatric: Mood and affect are normal. Speech and behavior are normal.  ____________________________________________  EKG: Interpreted by me. Ventricular paced rhythm with a rate of 123 bpm  ____________________________________________  ED COURSE:  Pertinent labs & imaging results that were available during my care of the patient were reviewed by me and considered in my medical decision making (see chart for details). Clinical Course   Patient presents to ER for persistent cough. We will assess with labs and imaging.  Procedures ____________________________________________   LABS (pertinent positives/negatives)  Labs Reviewed  CBC WITH DIFFERENTIAL/PLATELET - Abnormal; Notable for the following:  Result Value   RBC 3.74 (*)    Hemoglobin 11.9 (*)    HCT 34.6 (*)    Neutro Abs 7.0 (*)    Monocytes Absolute 1.1 (*)    All other components within normal limits  BASIC METABOLIC PANEL - Abnormal; Notable for the following:    Glucose, Bld 267 (*)    BUN 26 (*)    Creatinine, Ser 1.41 (*)    GFR calc non Af Amer 33 (*)    GFR calc Af Amer 38 (*)     All other components within normal limits  TROPONIN I - Abnormal; Notable for the following:    Troponin I 0.05 (*)    All other components within normal limits  BRAIN NATRIURETIC PEPTIDE  INFLUENZA PANEL BY PCR (TYPE A & B, H1N1)    RADIOLOGY  Chest x-ray IMPRESSION: No active cardiopulmonary disease.  Aortic atherosclerosis. ____________________________________________  FINAL ASSESSMENT AND PLAN  Cough, Chest pain, elevated troponin  Plan: Patient with labs and imaging as dictated above. Patient presented to the ER with persistent cough and shortness of breath. Troponin is elevated likely demand related. Family is very concerned and the patient for admission with cardiac rule out. We have started her on typical medications. She is stable for admission at this time.   Emily FilbertWilliams, Emmaline Wahba E, MD   Note: This dictation was prepared with Dragon dictation. Any transcriptional errors that result from this process are unintentional    Emily FilbertJonathan E Arilyn Brierley, MD 09/17/16 1510

## 2016-09-18 DIAGNOSIS — D649 Anemia, unspecified: Secondary | ICD-10-CM

## 2016-09-18 DIAGNOSIS — R7989 Other specified abnormal findings of blood chemistry: Secondary | ICD-10-CM

## 2016-09-18 DIAGNOSIS — N289 Disorder of kidney and ureter, unspecified: Secondary | ICD-10-CM

## 2016-09-18 DIAGNOSIS — R778 Other specified abnormalities of plasma proteins: Secondary | ICD-10-CM

## 2016-09-18 DIAGNOSIS — R531 Weakness: Secondary | ICD-10-CM

## 2016-09-18 DIAGNOSIS — J209 Acute bronchitis, unspecified: Secondary | ICD-10-CM

## 2016-09-18 LAB — CBC
HEMATOCRIT: 32.6 % — AB (ref 35.0–47.0)
HEMOGLOBIN: 11.1 g/dL — AB (ref 12.0–16.0)
MCH: 31.4 pg (ref 26.0–34.0)
MCHC: 33.9 g/dL (ref 32.0–36.0)
MCV: 92.5 fL (ref 80.0–100.0)
Platelets: 312 10*3/uL (ref 150–440)
RBC: 3.52 MIL/uL — ABNORMAL LOW (ref 3.80–5.20)
RDW: 12.3 % (ref 11.5–14.5)
WBC: 8.6 10*3/uL (ref 3.6–11.0)

## 2016-09-18 LAB — BASIC METABOLIC PANEL
Anion gap: 7 (ref 5–15)
BUN: 25 mg/dL — AB (ref 6–20)
CHLORIDE: 106 mmol/L (ref 101–111)
CO2: 27 mmol/L (ref 22–32)
Calcium: 9 mg/dL (ref 8.9–10.3)
Creatinine, Ser: 1.41 mg/dL — ABNORMAL HIGH (ref 0.44–1.00)
GFR calc Af Amer: 38 mL/min — ABNORMAL LOW (ref >60)
GFR calc non Af Amer: 33 mL/min — ABNORMAL LOW (ref >60)
GLUCOSE: 148 mg/dL — AB (ref 65–99)
Potassium: 4.1 mmol/L (ref 3.5–5.1)
Sodium: 140 mmol/L (ref 135–145)

## 2016-09-18 LAB — GLUCOSE, CAPILLARY: Glucose-Capillary: 149 mg/dL — ABNORMAL HIGH (ref 65–99)

## 2016-09-18 LAB — TROPONIN I: Troponin I: 0.03 ng/mL (ref <0.03)

## 2016-09-18 MED ORDER — AZITHROMYCIN 250 MG PO TABS: 250.0000 mg | ORAL_TABLET | Freq: Every day | ORAL | 0 refills | Status: DC

## 2016-09-18 MED ORDER — HYDROCOD POLST-CPM POLST ER 10-8 MG/5ML PO SUER: 5.0000 mL | Freq: Two times a day (BID) | ORAL | 0 refills | Status: DC

## 2016-09-18 MED ORDER — GUAIFENESIN ER 600 MG PO TB12
600.0000 mg | ORAL_TABLET | Freq: Two times a day (BID) | ORAL | Status: DC
  Administered 2016-09-18: 600 mg via ORAL
  Filled 2016-09-18: qty 1

## 2016-09-18 MED ORDER — IPRATROPIUM-ALBUTEROL 20-100 MCG/ACT IN AERS: 1.0000 | INHALATION_SPRAY | Freq: Four times a day (QID) | RESPIRATORY_TRACT | 5 refills | Status: AC

## 2016-09-18 MED ORDER — GUAIFENESIN ER 600 MG PO TB12: 600.0000 mg | ORAL_TABLET | Freq: Two times a day (BID) | ORAL | 0 refills | Status: DC

## 2016-09-18 MED ORDER — IPRATROPIUM-ALBUTEROL 0.5-2.5 (3) MG/3ML IN SOLN: 3.0000 mL | RESPIRATORY_TRACT | Status: DC

## 2016-09-18 MED ORDER — HYDROCOD POLST-CPM POLST ER 10-8 MG/5ML PO SUER
5.0000 mL | Freq: Two times a day (BID) | ORAL | Status: DC
  Administered 2016-09-18: 5 mL via ORAL
  Filled 2016-09-18: qty 5

## 2016-09-18 NOTE — Progress Notes (Deleted)
DR Winona LegatoVaickute was made aware of pt trop. Level of 0.03 , no new order continue to monitor

## 2016-09-18 NOTE — Discharge Summary (Signed)
[facility] [place] at [facility]   PATIENT NAME: Jessica Gardner    MR#:  [id number]  DATE OF BIRTH:  [birth year]  DATE OF ADMISSION:  09/17/2016 ADMITTING PHYSICIAN: Katha Hamming, MD  DATE OF DISCHARGE: 09/18/2016  2:23 PM  PRIMARY CARE PHYSICIAN: HANDE,VISHWANATH, MD     ADMISSION DIAGNOSIS:  Cough [R05] Elevated troponin I level [R74.8]  DISCHARGE DIAGNOSIS:  Principal Problem:   Chest pain Active Problems:   Acute bronchitis   Elevated troponin   Renal insufficiency   Generalized weakness   Anemia   SECONDARY DIAGNOSIS:   Past Medical History:  Diagnosis Date  . Dementia   . Diabetes mellitus   . MI (myocardial infarction)   . Pacemaker   . Stroke (HCC)     .pro HOSPITAL COURSE:   The patient is 83 year old African-American female with history of dementia, diabetes, coronary artery disease, stroke, who presents to the hospital with complaints of cough, some shortness of breath, runny nose for the past 2 days. There was no fever, chills or chest pains noted, no nausea, vomiting. In emergency room, patient was noted to have mild elevation of troponin to 0.05 and was admitted. Repeated troponin was better at 0.03. Patient denied any chest pains. She was initiated on Zithromax for suspected bronchitis, cough suppressants and improved clinically. She was felt to be stable to be discharged home today with home health services. Discussion by problem: #1. Acute bronchitis, continue Zithromax to complete course, Tussionex, Mucinex, albuterol inhaler #2. Renal insufficiency, no significant improvement with IV fluid administration, follow-up as outpatient, likely chronic. A urinalysis was not done, it is recommended to get it done as outpatient if symptomatic #3 anemia, stable, no bleeding noted #4. Generalized weakness, patient will be given home health services upon discharge, discussed this patient's family, agreeable  DISCHARGE CONDITIONS:    Stable  CONSULTS OBTAINED:    DRUG ALLERGIES:  No Known Allergies  DISCHARGE MEDICATIONS:   Discharge Medication List as of 09/18/2016  1:26 PM    START taking these medications   Details  azithromycin (ZITHROMAX) 250 MG tablet Take 1 tablet (250 mg total) by mouth daily., Starting Sat 09/18/2016, Normal    chlorpheniramine-HYDROcodone (TUSSIONEX) 10-8 MG/5ML SUER Take 5 mLs by mouth every 12 (twelve) hours., Starting Sat 09/18/2016, Normal    guaiFENesin (MUCINEX) 600 MG 12 hr tablet Take 1 tablet (600 mg total) by mouth 2 (two) times daily., Starting Sat 09/18/2016, Normal    Ipratropium-Albuterol (COMBIVENT RESPIMAT) 20-100 MCG/ACT AERS respimat Inhale 1 puff into the lungs every 6 (six) hours., Starting Sat 09/18/2016, Normal      CONTINUE these medications which have NOT CHANGED   Details  ALPRAZolam (XANAX) 0.25 MG tablet Take 0.25 mg by mouth daily., Starting Tue 09/14/2016, Historical Med    amLODipine (NORVASC) 5 MG tablet Take 5 mg by mouth daily., Historical Med    brinzolamide (AZOPT) 1 % ophthalmic suspension Place 1 drop into both eyes 2 (two) times daily., Historical Med    Cholecalciferol (VITAMIN D3) 2000 units capsule Take 2,000 Units by mouth daily., Historical Med    Cyanocobalamin (B-12) 500 MCG TABS Take 500 mcg by mouth 2 (two) times daily., Starting Mon 10/14/2014, Historical Med    dipyridamole-aspirin (AGGRENOX) 25-200 MG per 12 hr capsule Take 1 capsule by mouth 2 (two) times daily., Historical Med    gabapentin (NEURONTIN) 100 MG capsule Take 100 mg by mouth at bedtime., Historical Med    glipiZIDE (GLUCOTROL XL) 5  MG 24 hr tablet Take 5 mg by mouth daily., Starting Wed 08/25/2016, Historical Med    lisinopril (PRINIVIL,ZESTRIL) 40 MG tablet Take 40 mg by mouth daily., Historical Med    LUMIGAN 0.01 % SOLN Place 1 drop into both eyes daily., Starting Sat 09/11/2016, Historical Med    metFORMIN (GLUCOPHAGE) 500 MG tablet Take 500 mg by mouth 2 (two) times  daily., Historical Med    metoprolol succinate (TOPROL-XL) 50 MG 24 hr tablet Take 50 mg by mouth daily. Take with or immediately following a meal., Historical Med    omeprazole (PRILOSEC) 20 MG capsule Take 20 mg by mouth daily., Historical Med    potassium chloride (K-DUR,KLOR-CON) 10 MEQ tablet Take 10 mEq by mouth daily., Historical Med    simvastatin (ZOCOR) 20 MG tablet Take 20 mg by mouth at bedtime., Historical Med         DISCHARGE INSTRUCTIONS:    The patient is to follow-up with primary care physician as outpatient  If you experience worsening of your admission symptoms, develop shortness of breath, life threatening emergency, suicidal or homicidal thoughts you must seek medical attention immediately by calling 911 or calling your MD immediately  if symptoms less severe.  You Must read complete instructions/literature along with all the possible adverse reactions/side effects for all the Medicines you take and that have been prescribed to you. Take any new Medicines after you have completely understood and accept all the possible adverse reactions/side effects.   Please note  You were cared for by a hospitalist during your hospital stay. If you have any questions about your discharge medications or the care you received while you were in the hospital after you are discharged, you can call the unit and asked to speak with the hospitalist on call if the hospitalist that took care of you is not available. Once you are discharged, your primary care physician will handle any further medical issues. Please note that NO REFILLS for any discharge medications will be authorized once you are discharged, as it is imperative that you return to your primary care physician (or establish a relationship with a primary care physician if you do not have one) for your aftercare needs so that they can reassess your need for medications and monitor your lab values.    Today   CHIEF COMPLAINT:    Chief Complaint  Patient presents with  . Cough    HISTORY OF PRESENT ILLNESS:  Jessica Gardner  is a 83 y.o. female with a known history of dementia, diabetes, coronary artery disease, stroke, who presents to the hospital with complaints of cough, some shortness of breath, runny nose for the past 2 days. There was no fever, chills or chest pains noted, no nausea, vomiting. In emergency room, patient was noted to have mild elevation of troponin to 0.05 and was admitted. Repeated troponin was better at 0.03. Patient denied any chest pains. She was initiated on Zithromax for suspected bronchitis, cough suppressants and improved clinically. She was felt to be stable to be discharged home today with home health services. Discussion by problem: #1. Acute bronchitis, continue Zithromax to complete course, Tussionex, Mucinex, albuterol inhaler #2. Renal insufficiency, no significant improvement with IV fluid administration, follow-up as outpatient, likely chronic. A urinalysis was not done, it is recommended to get it done as outpatient if symptomatic #3 anemia, stable, no bleeding noted #4. Generalized weakness, patient will be given home health services upon discharge, discussed this patient's family, agreeable  VITAL SIGNS:  Blood pressure (!) 102/52, pulse 96, temperature 99.1 F (37.3 C), temperature source Oral, resp. rate 18, height 5\' 1"  (1.549 m), weight 66 kg (145 lb 8 oz), SpO2 100 %.  I/O:   Intake/Output Summary (Last 24 hours) at 09/18/16 1611 Last data filed at 09/18/16 1303  Gross per 24 hour  Intake          1606.25 ml  Output              300 ml  Net          1306.25 ml    PHYSICAL EXAMINATION:  GENERAL:  83 y.o.-year-old patient lying in the bed with no acute distress.  EYES: Pupils equal, round, reactive to light and accommodation. No scleral icterus. Extraocular muscles intact.  HEENT: Head atraumatic, normocephalic. Oropharynx and nasopharynx clear.  NECK:   Supple, no jugular venous distention. No thyroid enlargement, no tenderness.  LUNGS: Normal breath sounds bilaterally, no wheezing, rales,rhonchi or crepitation. No use of accessory muscles of respiration.  CARDIOVASCULAR: S1, S2 normal. No murmurs, rubs, or gallops.  ABDOMEN: Soft, non-tender, non-distended. Bowel sounds present. No organomegaly or mass.  EXTREMITIES: No pedal edema, cyanosis, or clubbing.  NEUROLOGIC: Cranial nerves II through XII are intact. Muscle strength 5/5 in all extremities. Sensation intact. Gait not checked.  PSYCHIATRIC: The patient is alert and oriented x 3.  SKIN: No obvious rash, lesion, or ulcer.   DATA REVIEW:   CBC  Recent Labs Lab 09/18/16 0439  WBC 8.6  HGB 11.1*  HCT 32.6*  PLT 312    Chemistries   Recent Labs Lab 09/18/16 0439  NA 140  K 4.1  CL 106  CO2 27  GLUCOSE 148*  BUN 25*  CREATININE 1.41*  CALCIUM 9.0    Cardiac Enzymes  Recent Labs Lab 09/18/16 1154  TROPONINI 0.03*    Microbiology Results  Results for orders placed or performed during the hospital encounter of 09/17/16  Rapid Influenza A&B Antigens ([facility] only)     Status: None   Collection Time: 09/17/16  3:32 PM  Result Value Ref Range Status   Influenza A ([facility]) NEGATIVE NEGATIVE Final   Influenza B [facility]) NEGATIVE NEGATIVE Final    RADIOLOGY:  Dg Chest 2 View  Result Date: 09/17/2016 CLINICAL DATA:  Dyspnea.  Cough. EXAM: CHEST  2 VIEW COMPARISON:  08/27/2012 chest radiograph. FINDINGS: Stable configuration of 2 lead left subclavian pacemaker. Stable cardiomediastinal silhouette with normal heart size and aortic atherosclerosis. No pneumothorax. No pleural effusion. Lungs appear clear, with no acute consolidative airspace disease and no pulmonary edema. IMPRESSION: No active cardiopulmonary disease. Aortic atherosclerosis. Electronically Signed   By: Delbert Phenix M.D.   On: 09/17/2016 13:42   Ct Angio Chest Pe W And/or Wo Contrast  Result Date:  09/17/2016 CLINICAL DATA:  83 year old female with a history of cough EXAM: CT ANGIOGRAPHY CHEST WITH CONTRAST TECHNIQUE: Multidetector CT imaging of the chest was performed using the standard protocol during bolus administration of intravenous contrast. Multiplanar CT image reconstructions and MIPs were obtained to evaluate the vascular anatomy. CONTRAST:  60 cc Isovue 370 COMPARISON:  No prior chest CT FINDINGS: Cardiovascular: Heart: No cardiomegaly. No pericardial fluid/thickening. Dense calcifications of the mitral annulus. Calcifications of left main, left anterior descending, circumflex, right coronary artery. Cardiac pacing device on left chest wall with leads in place. Aorta: Unremarkable course, caliber, contour of the thoracic aorta. No aneurysm or dissection flap. No periaortic fluid. Pulmonary arteries: No central, lobar,  segmental filling defects. Respiratory motion and timing of the bolus contrast limit more distal assessment. Mediastinum/Nodes: Mediastinal lymph nodes are present, none of which are enlarged by CT size criteria. Unremarkable appearance of the thoracic esophagus. Unremarkable appearance of the thoracic inlet and thyroid. Lungs/Pleura: Central airways are clear. No pleural effusion. No confluent left-sided airspace disease. No pneumothorax. Upper Abdomen: Small hiatal hernia Musculoskeletal: No displaced fracture. Degenerative changes of the spine. Review of the MIP images confirms the above findings. IMPRESSION: Study is negative for pulmonary emboli. No acute finding. Left main and 3 vessel coronary artery disease. Left chest wall cardiac pacing device. Hiatal hernia. Signed, Yvone Neu. Loreta Ave, DO [facility] Electronically Signed   By: Gilmer Mor D.O.   On: 09/17/2016 15:49    EKG:   Orders placed or performed during the hospital encounter of 09/17/16  . EKG 12-Lead  . EKG 12-Lead  . EKG 12-Lead  . EKG 12-Lead  . ED  EKG  . ED EKG      Management plans discussed with the patient, family and they are in agreement.  CODE STATUS:     Code Status Orders        Start     Ordered   09/17/16 1557  Full code  Continuous     09/17/16 1557    Code Status History    Date Active Date Inactive Code Status Order ID Comments User Context   This patient has a current code status but no historical code status.    Advance Directive Documentation   Flowsheet Row Most Recent Value  Type of Advance Directive  Healthcare Power of Attorney  Pre-existing out of facility DNR order (yellow form or pink MOST form)  No data  "MOST" Form in Place?  No data      TOTAL TIME TAKING CARE OF THIS PATIENT: 40 minutes.  Discussed this patient's daughter, all questions were answered, she voiced understanding and appreciation  Oziel Beitler M.D on 09/18/2016 at 4:11 PM  Between 7am to 6pm - Pager - [phone]  After 6pm go to [email or web address] - password [email or web address] [facility]  [facility]  Office  [phone]  CC: Primary care physician; Barbette Reichmann, MD

## 2016-09-18 NOTE — Progress Notes (Signed)
A&O with periods of forgetfulness. Very weak, using bedpan. On IV fluids. No complaints. Slept well through the night.

## 2016-09-18 NOTE — Care Management Obs Status (Signed)
MEDICARE OBSERVATION STATUS NOTIFICATION   Patient Details  Name: Jessica Gardner MRN: [id number] Date of Birth: May 24, 1930   Medicare Observation Status Notification Given:  No (discharge order in less than 24 hours)    Caren MacadamMichelle Sanii Kukla, RN 09/18/2016, 3:13 PM

## 2016-09-18 NOTE — Progress Notes (Signed)
Patient is discharge home in a stable condition, summary and f/u care given to both pt and daughter , verbalized understanding . Left with daughter

## 2016-12-18 ENCOUNTER — Encounter: Payer: Self-pay | Admitting: Emergency Medicine

## 2016-12-18 ENCOUNTER — Emergency Department
Admission: EM | Admit: 2016-12-18 | Discharge: 2016-12-18 | Disposition: A | Discharge status: Home / Self Care | Payer: Medicare Other | Attending: Emergency Medicine | Admitting: Emergency Medicine

## 2016-12-18 ENCOUNTER — Emergency Department: Payer: Medicare Other

## 2016-12-18 DIAGNOSIS — I252 Old myocardial infarction: Secondary | ICD-10-CM | POA: Diagnosis not present

## 2016-12-18 DIAGNOSIS — E119 Type 2 diabetes mellitus without complications: Secondary | ICD-10-CM | POA: Insufficient documentation

## 2016-12-18 DIAGNOSIS — Y939 Activity, unspecified: Secondary | ICD-10-CM | POA: Diagnosis not present

## 2016-12-18 DIAGNOSIS — S4992XA Unspecified injury of left shoulder and upper arm, initial encounter: Secondary | ICD-10-CM | POA: Diagnosis present

## 2016-12-18 DIAGNOSIS — Y929 Unspecified place or not applicable: Secondary | ICD-10-CM | POA: Insufficient documentation

## 2016-12-18 DIAGNOSIS — Z7984 Long term (current) use of oral hypoglycemic drugs: Secondary | ICD-10-CM | POA: Diagnosis not present

## 2016-12-18 DIAGNOSIS — Y999 Unspecified external cause status: Secondary | ICD-10-CM | POA: Insufficient documentation

## 2016-12-18 DIAGNOSIS — M25512 Pain in left shoulder: Secondary | ICD-10-CM | POA: Diagnosis not present

## 2016-12-18 DIAGNOSIS — Z79899 Other long term (current) drug therapy: Secondary | ICD-10-CM | POA: Insufficient documentation

## 2016-12-18 DIAGNOSIS — Z950 Presence of cardiac pacemaker: Secondary | ICD-10-CM | POA: Diagnosis not present

## 2016-12-18 DIAGNOSIS — W19XXXA Unspecified fall, initial encounter: Secondary | ICD-10-CM | POA: Diagnosis not present

## 2016-12-18 MED ORDER — ACETAMINOPHEN 325 MG PO TABS
650.0000 mg | ORAL_TABLET | Freq: Once | ORAL | Status: AC
  Administered 2016-12-18: 650 mg via ORAL
  Filled 2016-12-18: qty 2

## 2016-12-18 NOTE — ED Triage Notes (Signed)
Pt fell last week and had xr's. Does not know the results of xr's and is still in pain. Daughter called 911 for her to be seen and xr results received. Per ems pt not given anything for pain until they get the results and is suppose to be taking otc tylenol for pain. Has not had any today.

## 2016-12-18 NOTE — Discharge Instructions (Signed)
Arm sling for 3-5 days as needed. Advised over-the-counter extra strength Tylenol for pain.

## 2016-12-18 NOTE — ED Provider Notes (Signed)
[facility] Emergency Department Provider Note   ____________________________________________   First MD Initiated Contact with Patient 12/18/16 1240     (approximate)  I have reviewed the triage vital signs and the nursing notes.   HISTORY  Chief Complaint Shoulder Pain    HPI Jessica Gardner is a 83 y.o. female patient arrived EMS for results of x-rays of the left shoulder. Patient fell one week ago and was seen by the [facility] 2 days ago. Patient had x-rays taken of his shoulder but results are not given. Patient is placed in a sling and told to take Tylenol 2 prescription results. Patient daughter called 911 to have patient come to the ER to get the x-ray results. X-ray results were not found even though there is a record of the procedure. Advised the patient will have to re-x-ray her shoulder for definitive diagnosis and treatment plan.   Past Medical History:  Diagnosis Date  . Dementia   . Diabetes mellitus   . MI (myocardial infarction)   . Pacemaker   . Stroke [facility])     Patient Active Problem List   Diagnosis Date Noted  . Acute bronchitis 09/18/2016  . Elevated troponin 09/18/2016  . Anemia 09/18/2016  . Renal insufficiency 09/18/2016  . Generalized weakness 09/18/2016  . Chest pain 09/17/2016    Past Surgical History:  Procedure Laterality Date  . PACEMAKER INSERTION      Prior to Admission medications   Medication Sig Start Date End Date Taking? Authorizing Provider  ALPRAZolam (XANAX) 0.25 MG tablet Take 0.25 mg by mouth daily. 09/14/16   Historical Provider, MD  amLODipine (NORVASC) 5 MG tablet Take 5 mg by mouth daily.    Historical Provider, MD  azithromycin (ZITHROMAX) 250 MG tablet Take 1 tablet (250 mg total) by mouth daily. 09/18/16   Katharina Caper, MD  brinzolamide (AZOPT) 1 % ophthalmic suspension Place 1 drop into both eyes 2 (two) times daily.    Historical Provider, MD  chlorpheniramine-HYDROcodone (TUSSIONEX)  10-8 MG/5ML SUER Take 5 mLs by mouth every 12 (twelve) hours. 09/18/16   Katharina Caper, MD  Cholecalciferol (VITAMIN D3) 2000 units capsule Take 2,000 Units by mouth daily.    Historical Provider, MD  Cyanocobalamin (B-12) 500 MCG TABS Take 500 mcg by mouth 2 (two) times daily. 10/14/14   Historical Provider, MD  dipyridamole-aspirin (AGGRENOX) 25-200 MG per 12 hr capsule Take 1 capsule by mouth 2 (two) times daily.    Historical Provider, MD  gabapentin (NEURONTIN) 100 MG capsule Take 100 mg by mouth at bedtime.    Historical Provider, MD  glipiZIDE (GLUCOTROL XL) 5 MG 24 hr tablet Take 5 mg by mouth daily. 08/25/16   Historical Provider, MD  guaiFENesin (MUCINEX) 600 MG 12 hr tablet Take 1 tablet (600 mg total) by mouth 2 (two) times daily. 09/18/16   Katharina Caper, MD  Ipratropium-Albuterol (COMBIVENT RESPIMAT) 20-100 MCG/ACT AERS respimat Inhale 1 puff into the lungs every 6 (six) hours. 09/18/16   Katharina Caper, MD  lisinopril (PRINIVIL,ZESTRIL) 40 MG tablet Take 40 mg by mouth daily.    Historical Provider, MD  LUMIGAN 0.01 % SOLN Place 1 drop into both eyes daily. 09/11/16   Historical Provider, MD  metFORMIN (GLUCOPHAGE) 500 MG tablet Take 500 mg by mouth 2 (two) times daily.    Historical Provider, MD  metoprolol succinate (TOPROL-XL) 50 MG 24 hr tablet Take 50 mg by mouth daily. Take with or immediately following a meal.  Historical Provider, MD  omeprazole (PRILOSEC) 20 MG capsule Take 20 mg by mouth daily.    Historical Provider, MD  potassium chloride (K-DUR,KLOR-CON) 10 MEQ tablet Take 10 mEq by mouth daily.    Historical Provider, MD  simvastatin (ZOCOR) 20 MG tablet Take 20 mg by mouth at bedtime.    Historical Provider, MD    Allergies Patient has no known allergies.  Family History  Problem Relation Age of Onset  . Breast cancer Sister 2    Social History Social History  Substance Use Topics  . Smoking status: Never Smoker  . Smokeless tobacco: Never Used  . Alcohol use No      Review of Systems Constitutional: No fever/chills Eyes: No visual changes. ENT: No sore throat. Cardiovascular: Denies chest pain. Respiratory: Denies shortness of breath. Gastrointestinal: No abdominal pain.  No nausea, no vomiting.  No diarrhea.  No constipation. Genitourinary: Negative for dysuria. Musculoskeletal: Shoulder pain  Skin: Negative for rash. Neurological: Negative for headaches, focal weakness or numbness. Psychiatric:Dementia Endocrine:Diabetes  ____________________________________________   PHYSICAL EXAM:  VITAL SIGNS: ED Triage Vitals [12/18/16 1237]  Enc Vitals Group     BP      Pulse      Resp      Temp      Temp src      SpO2      Weight      Height      Head Circumference      Peak Flow      Pain Score 7     Pain Loc      Pain Edu?      Excl. in GC?     Constitutional: Alert and oriented. Mild distress  Eyes: Conjunctivae are normal. PERRL. EOMI. Head: Atraumatic. Nose: No congestion/rhinnorhea. Mouth/Throat: Mucous membranes are moist.  Oropharynx non-erythematous. Neck: No stridor.  No cervical spine tenderness to palpation. Hematological/Lymphatic/Immunilogical: No cervical lymphadenopathy. Cardiovascular: Normal rate, regular rhythm. Grossly normal heart sounds.  Good peripheral circulation. Respiratory: Normal respiratory effort.  No retractions. Lungs CTAB. Gastrointestinal: Soft and nontender. No distention. No abdominal bruits. No CVA tenderness. Musculoskeletal:No obvious edema or deformity to the left upper extremity. Patient has some moderate guarding palpation mid humerus.  Neurologic:  Normal speech and language. No gross focal neurologic deficits are appreciated. No gait instability. Skin:  Skin is warm, dry and intact. No rash noted. Psychiatric: Mood and affect are normal. Speech and behavior are normal.  ____________________________________________   LABS (all labs ordered are listed, but only abnormal results are  displayed)  Labs Reviewed - No data to display ____________________________________________  EKG   ____________________________________________  RADIOLOGY  No acute findings on x-ray of the left shoulder. ____________________________________________   PROCEDURES  Procedure(s) performed: None  Procedures  Critical Care performed: No  ____________________________________________   INITIAL IMPRESSION / ASSESSMENT AND PLAN / ED COURSE  Pertinent labs & imaging results that were available during my care of the patient were reviewed by me and considered in my medical decision making (see chart for details).  Left shoulder pain secondary to a fall. Unable to obtain x-ray results from the clinic; patient was sent for re-x-ray.      ____________________________________________   FINAL CLINICAL IMPRESSION(S) / ED DIAGNOSES  Final diagnoses:  Acute pain of left shoulder  Discussed x-ray findings with care giver. Patient placed in a sling and advised to continue taking extra strength Tylenol for pain. Patient advised to follow-up with family doctor for continued care.  NEW MEDICATIONS STARTED DURING THIS VISIT:  New Prescriptions   No medications on file     Note:  This document was prepared using Dragon voice recognition software and may include unintentional dictation errors.    Joni Reining, PA-C 12/18/16 1404    Minna Antis, MD 12/18/16 [phone]

## 2017-09-08 ENCOUNTER — Observation Stay
Admission: EM | Admit: 2017-09-08 | Discharge: 2017-09-13 | Disposition: A | Discharge status: Home Health | Payer: Medicare Other | Attending: Internal Medicine | Admitting: Internal Medicine

## 2017-09-08 ENCOUNTER — Encounter: Payer: Self-pay | Admitting: Emergency Medicine

## 2017-09-08 ENCOUNTER — Emergency Department: Payer: Medicare Other

## 2017-09-08 DIAGNOSIS — E785 Hyperlipidemia, unspecified: Secondary | ICD-10-CM | POA: Diagnosis not present

## 2017-09-08 DIAGNOSIS — H42 Glaucoma in diseases classified elsewhere: Secondary | ICD-10-CM | POA: Diagnosis not present

## 2017-09-08 DIAGNOSIS — M8580 Other specified disorders of bone density and structure, unspecified site: Secondary | ICD-10-CM | POA: Insufficient documentation

## 2017-09-08 DIAGNOSIS — R200 Anesthesia of skin: Secondary | ICD-10-CM | POA: Insufficient documentation

## 2017-09-08 DIAGNOSIS — F329 Major depressive disorder, single episode, unspecified: Secondary | ICD-10-CM | POA: Insufficient documentation

## 2017-09-08 DIAGNOSIS — Z7902 Long term (current) use of antithrombotics/antiplatelets: Secondary | ICD-10-CM | POA: Diagnosis not present

## 2017-09-08 DIAGNOSIS — R531 Weakness: Secondary | ICD-10-CM | POA: Diagnosis not present

## 2017-09-08 DIAGNOSIS — F419 Anxiety disorder, unspecified: Secondary | ICD-10-CM | POA: Diagnosis not present

## 2017-09-08 DIAGNOSIS — R339 Retention of urine, unspecified: Secondary | ICD-10-CM | POA: Insufficient documentation

## 2017-09-08 DIAGNOSIS — I700 Atherosclerosis of aorta: Secondary | ICD-10-CM | POA: Diagnosis not present

## 2017-09-08 DIAGNOSIS — K5900 Constipation, unspecified: Secondary | ICD-10-CM | POA: Diagnosis not present

## 2017-09-08 DIAGNOSIS — Z7984 Long term (current) use of oral hypoglycemic drugs: Secondary | ICD-10-CM | POA: Diagnosis not present

## 2017-09-08 DIAGNOSIS — I69354 Hemiplegia and hemiparesis following cerebral infarction affecting left non-dominant side: Secondary | ICD-10-CM | POA: Diagnosis not present

## 2017-09-08 DIAGNOSIS — I081 Rheumatic disorders of both mitral and tricuspid valves: Secondary | ICD-10-CM | POA: Diagnosis not present

## 2017-09-08 DIAGNOSIS — Z79899 Other long term (current) drug therapy: Secondary | ICD-10-CM | POA: Diagnosis not present

## 2017-09-08 DIAGNOSIS — Z8673 Personal history of transient ischemic attack (TIA), and cerebral infarction without residual deficits: Secondary | ICD-10-CM

## 2017-09-08 DIAGNOSIS — I6523 Occlusion and stenosis of bilateral carotid arteries: Secondary | ICD-10-CM | POA: Diagnosis not present

## 2017-09-08 DIAGNOSIS — E1139 Type 2 diabetes mellitus with other diabetic ophthalmic complication: Secondary | ICD-10-CM | POA: Diagnosis not present

## 2017-09-08 DIAGNOSIS — I252 Old myocardial infarction: Secondary | ICD-10-CM | POA: Diagnosis not present

## 2017-09-08 DIAGNOSIS — I10 Essential (primary) hypertension: Secondary | ICD-10-CM | POA: Diagnosis not present

## 2017-09-08 DIAGNOSIS — I959 Hypotension, unspecified: Secondary | ICD-10-CM | POA: Insufficient documentation

## 2017-09-08 DIAGNOSIS — F0390 Unspecified dementia without behavioral disturbance: Secondary | ICD-10-CM | POA: Diagnosis not present

## 2017-09-08 DIAGNOSIS — K219 Gastro-esophageal reflux disease without esophagitis: Secondary | ICD-10-CM | POA: Diagnosis not present

## 2017-09-08 DIAGNOSIS — M7502 Adhesive capsulitis of left shoulder: Secondary | ICD-10-CM | POA: Insufficient documentation

## 2017-09-08 DIAGNOSIS — Z950 Presence of cardiac pacemaker: Secondary | ICD-10-CM | POA: Insufficient documentation

## 2017-09-08 DIAGNOSIS — B488 Other specified mycoses: Secondary | ICD-10-CM | POA: Insufficient documentation

## 2017-09-08 DIAGNOSIS — R29898 Other symptoms and signs involving the musculoskeletal system: Secondary | ICD-10-CM | POA: Diagnosis present

## 2017-09-08 DIAGNOSIS — E119 Type 2 diabetes mellitus without complications: Secondary | ICD-10-CM

## 2017-09-08 DIAGNOSIS — Z7982 Long term (current) use of aspirin: Secondary | ICD-10-CM | POA: Insufficient documentation

## 2017-09-08 LAB — BASIC METABOLIC PANEL
ANION GAP: 6 (ref 5–15)
BUN: 18 mg/dL (ref 6–20)
CALCIUM: 9.1 mg/dL (ref 8.9–10.3)
CO2: 25 mmol/L (ref 22–32)
Chloride: 108 mmol/L (ref 101–111)
Creatinine, Ser: 1.34 mg/dL — ABNORMAL HIGH (ref 0.44–1.00)
GFR calc non Af Amer: 35 mL/min — ABNORMAL LOW (ref >60)
GFR, EST AFRICAN AMERICAN: 41 mL/min — AB (ref >60)
GLUCOSE: 155 mg/dL — AB (ref 65–99)
POTASSIUM: 3.6 mmol/L (ref 3.5–5.1)
Sodium: 139 mmol/L (ref 135–145)

## 2017-09-08 LAB — CBC
HEMATOCRIT: 32.1 % — AB (ref 35.0–47.0)
HEMOGLOBIN: 10.7 g/dL — AB (ref 12.0–16.0)
MCH: 31 pg (ref 26.0–34.0)
MCHC: 33.5 g/dL (ref 32.0–36.0)
MCV: 92.5 fL (ref 80.0–100.0)
Platelets: 352 10*3/uL (ref 150–440)
RBC: 3.46 MIL/uL — AB (ref 3.80–5.20)
RDW: 12.4 % (ref 11.5–14.5)
WBC: 6.3 10*3/uL (ref 3.6–11.0)

## 2017-09-08 LAB — URINALYSIS, COMPLETE (UACMP) WITH MICROSCOPIC
BACTERIA UA: NONE SEEN
Bilirubin Urine: NEGATIVE
Glucose, UA: NEGATIVE mg/dL
HGB URINE DIPSTICK: NEGATIVE
KETONES UR: NEGATIVE mg/dL
Leukocytes, UA: NEGATIVE
NITRITE: NEGATIVE
PROTEIN: NEGATIVE mg/dL
RBC / HPF: NONE SEEN RBC/hpf (ref 0–5)
Specific Gravity, Urine: 1.017 (ref 1.005–1.030)
pH: 5 (ref 5.0–8.0)

## 2017-09-08 LAB — TROPONIN I

## 2017-09-08 NOTE — ED Provider Notes (Signed)
[facility] Emergency Department Provider Note    First MD Initiated Contact with Patient 09/08/17 2102     (approximate)  I have reviewed the triage vital signs and the nursing notes.   HISTORY  Chief Complaint Extremity Weakness  Level V Caveat: dementia  HPI Jessica Gardner is a 83 y.o. female with a history of dementia as well as history of MI as well as remote history of stroke with reported left-sided deficits presents with chief complaint of difficulty moving her left leg since this morning around 4 AM.  States she is having difficulty walking typically walks with a walker.  Denies any fevers.  No chest pain.  Does have mild headache but this is been going on for several days.  Denies any blurry vision.  No other associated numbness or tingling.  No dysuria.  No abdominal pain.  Past Medical History:  Diagnosis Date  . Dementia   . Diabetes mellitus   . MI (myocardial infarction) (HCC)   . Pacemaker   . Stroke [facility](HCC)    Family History  Problem Relation Age of Onset  . Breast cancer Sister [phone]   Past Surgical History:  Procedure Laterality Date  . PACEMAKER INSERTION     Patient Active Problem List   Diagnosis Date Noted  . Acute bronchitis 09/18/2016  . Elevated troponin 09/18/2016  . Anemia 09/18/2016  . Renal insufficiency 09/18/2016  . Generalized weakness 09/18/2016  . Chest pain 09/17/2016      Prior to Admission medications   Medication Sig Start Date End Date Taking? Authorizing Provider  ALPRAZolam Prudy Feeler(XANAX) 0.25 MG tablet Take 0.25 mg by mouth at bedtime as needed for sleep.  09/14/16  Yes [provider]  amLODipine (NORVASC) 5 MG tablet Take 5 mg by mouth daily.   Yes [provider]  brinzolamide (AZOPT) 1 % ophthalmic suspension Place 1 drop into both eyes 2 (two) times daily.   Yes [provider]  Cholecalciferol (VITAMIN D3) 2000 units capsule Take 2,000 Units by mouth daily.   Yes [provider]  Cyanocobalamin (B-12) 500 MCG TABS Take 500 mcg by mouth 2 (two) times daily. 10/14/14  Yes [provider]  dipyridamole-aspirin (AGGRENOX) 25-200 MG per 12 hr capsule Take 1 capsule by mouth 2 (two) times daily.   Yes [provider]  gabapentin (NEURONTIN) 100 MG capsule Take 100 mg by mouth at bedtime.   Yes [provider]  glipiZIDE (GLUCOTROL XL) 5 MG 24 hr tablet Take 5 mg by mouth daily. 08/25/16  Yes [provider]  latanoprost (XALATAN) 0.005 % ophthalmic solution Place 1 drop into both eyes at bedtime.   Yes [provider]  lisinopril (PRINIVIL,ZESTRIL) 40 MG tablet Take 40 mg by mouth daily.   Yes [provider]  metFORMIN (GLUCOPHAGE) 500 MG tablet Take 500 mg by mouth 2 (two) times daily.   Yes [provider]  metoprolol succinate (TOPROL-XL) 50 MG 24 hr tablet Take 50 mg by mouth daily. Take with or immediately following a meal.   Yes [provider]  omeprazole (PRILOSEC) 20 MG capsule Take 20 mg by mouth daily.   Yes [provider]  PARoxetine (PAXIL) 20 MG tablet Take 20 mg by mouth daily.   Yes [provider]  POLYSACCHARIDE IRON COMPLEX PO Take 1 capsule by mouth daily.   Yes [provider]  potassium chloride (K-DUR,KLOR-CON) 10 MEQ tablet Take 10 mEq by mouth daily.  Yes [provider]  simvastatin (ZOCOR) 20 MG tablet Take 20 mg by mouth at bedtime.   Yes [provider]  Vitamin D, Ergocalciferol, (DRISDOL) 50000 units CAPS capsule Take 1 capsule by mouth once a week.   Yes [provider]  azithromycin (ZITHROMAX) 250 MG tablet Take 1 tablet (250 mg total) by mouth daily. Patient not taking: Reported on 09/08/2017 09/18/16   Katharina Caper, MD  chlorpheniramine-HYDROcodone (TUSSIONEX) 10-8 MG/5ML SUER Take 5 mLs by mouth every 12 (twelve) hours. Patient not taking: Reported on 09/08/2017 09/18/16   Katharina Caper, MD  guaiFENesin  (MUCINEX) 600 MG 12 hr tablet Take 1 tablet (600 mg total) by mouth 2 (two) times daily. Patient not taking: Reported on 09/08/2017 09/18/16   Katharina Caper, MD  Ipratropium-Albuterol (COMBIVENT RESPIMAT) 20-100 MCG/ACT AERS respimat Inhale 1 puff into the lungs every 6 (six) hours. 09/18/16   Katharina Caper, MD    Allergies Patient has no known allergies.    Social History Social History   Tobacco Use  . Smoking status: Never Smoker  . Smokeless tobacco: Never Used  Substance Use Topics  . Alcohol use: No  . Drug use: No    Review of Systems Patient denies headaches, rhinorrhea, blurry vision, numbness, shortness of breath, chest pain, edema, cough, abdominal pain, nausea, vomiting, diarrhea, dysuria, fevers, rashes or hallucinations unless otherwise stated above in HPI. ____________________________________________   PHYSICAL EXAM:  VITAL SIGNS: Vitals:   09/08/17 2106 09/08/17 2200  BP: 106/64 109/66  Pulse: 89 88  Resp: 18 11  Temp: 97.7 F (36.5 C)   SpO2: 98% 97%    Constitutional: Alert  in no acute distress. Eyes: Conjunctivae are normal.  Head: Atraumatic. Nose: No congestion/rhinnorhea. Mouth/Throat: Mucous membranes are moist.   Neck: No stridor. Painless ROM.  Cardiovascular: Normal rate, regular rhythm. Grossly normal heart sounds.  Good peripheral circulation. Respiratory: Normal respiratory effort.  No retractions. Lungs CTAB. Gastrointestinal: Soft and nontender. No distention. No abdominal bruits. No CVA tenderness. Genitourinary:  Musculoskeletal: No lower extremity tenderness nor edema.  No joint effusions. Neurologic:  CN- intact.  No facial droop,. Sensation intact bilaterally. Normal speech and language.  MAE spontaneously.  Skin:  Skin is warm, dry and intact. No rash noted. Psychiatric: Mood and affect are normal. Speech and behavior are normal.  ____________________________________________   LABS (all labs ordered are listed, but only  abnormal results are displayed)  Results for orders placed or performed during the hospital encounter of 09/08/17 (from the past 24 hour(s))  CBC     Status: Abnormal   Collection Time: 09/08/17  9:00 PM  Result Value Ref Range   WBC 6.3 3.6 - 11.0 K/uL   RBC 3.46 (L) 3.80 - 5.20 MIL/uL   Hemoglobin 10.7 (L) 12.0 - 16.0 g/dL   HCT 16.1 (L) 09.6 - 04.5 %   MCV 92.5 80.0 - 100.0 fL   MCH 31.0 26.0 - 34.0 pg   MCHC 33.5 32.0 - 36.0 g/dL   RDW 40.9 81.1 - 91.4 %   Platelets 352 150 - 440 K/uL   ____________________________________________  EKG My review and personal interpretation at Time: 21:02   Indication: weakness  Rate: 90  Rhythm: sinus Axis: left Other: rbbb, no stemi, nonspecific st changes ____________________________________________  RADIOLOGY  I personally reviewed all radiographic images ordered to evaluate for the above acute complaints and reviewed radiology reports and findings.  These findings were personally discussed with the patient.  Please see medical record for  radiology report.  ____________________________________________   PROCEDURES  Procedure(s) performed:  Procedures    Critical Care performed: no ____________________________________________   INITIAL IMPRESSION / ASSESSMENT AND PLAN / ED COURSE  Pertinent labs & imaging results that were available during my care of the patient were reviewed by me and considered in my medical decision making (see chart for details).  DDX: cva, tia, hypoglycemia, dehydration, electrolyte abnormality, dissection, sepsis   Indy L Elza Gardner is a 83 y.o. who presents to the ED with plan of weakness predominantly on the left side since 4 this morning.  My exam the patient is moving all extremities in response to pain.  Has no focal deficits but does have a history of stroke.  Exam is somewhat limited by her history of dementia.  On review of her history it appears that she presented with similar symptoms in 2010  and had normal workup with a normal MRI and this may be what family is referring to his previous history of stroke.  Patient remains hemodynamically stable.  Patient will be signed out to oncoming physician pending urinalysis.  Do feel that urinary tract infection could appropriately explain her weakness and unsteady gait.  If urinalysis is normal do feel patient will require hospitalization for neurologic workup as I am unable to obtain an MRI due to her history of pacemaker.      ____________________________________________   FINAL CLINICAL IMPRESSION(S) / ED DIAGNOSES  Final diagnoses:  Weakness      NEW MEDICATIONS STARTED DURING THIS VISIT:  This SmartLink is deprecated. Use AVSMEDLIST instead to display the medication list for a patient.   Note:  This document was prepared using Dragon voice recognition software and may include unintentional dictation errors.    Willy Eddyobinson, Kiptyn Rafuse, MD 09/08/17 [phone]

## 2017-09-08 NOTE — ED Notes (Signed)
Patient transported to CT 

## 2017-09-08 NOTE — ED Triage Notes (Signed)
Pt in via POV from home, pt states, "I think I had a stroke, I cant move my left leg."  Per EMS, pt last known well time 0400 today.  Pt with hx of CVA with left side weakness at baseline.  Pt A/Ox4, vitals WDL, NAD noted at this time.

## 2017-09-09 ENCOUNTER — Observation Stay: Payer: Medicare Other

## 2017-09-09 ENCOUNTER — Other Ambulatory Visit: Payer: Self-pay

## 2017-09-09 ENCOUNTER — Observation Stay (HOSPITAL_BASED_OUTPATIENT_CLINIC_OR_DEPARTMENT_OTHER)
Admit: 2017-09-09 | Discharge: 2017-09-09 | Disposition: A | Discharge status: Home / Self Care | Payer: Medicare Other | Attending: Internal Medicine | Admitting: Internal Medicine

## 2017-09-09 DIAGNOSIS — I340 Nonrheumatic mitral (valve) insufficiency: Secondary | ICD-10-CM

## 2017-09-09 DIAGNOSIS — F0390 Unspecified dementia without behavioral disturbance: Secondary | ICD-10-CM | POA: Diagnosis present

## 2017-09-09 DIAGNOSIS — I639 Cerebral infarction, unspecified: Secondary | ICD-10-CM

## 2017-09-09 DIAGNOSIS — R29898 Other symptoms and signs involving the musculoskeletal system: Secondary | ICD-10-CM

## 2017-09-09 DIAGNOSIS — E119 Type 2 diabetes mellitus without complications: Secondary | ICD-10-CM

## 2017-09-09 DIAGNOSIS — Z8673 Personal history of transient ischemic attack (TIA), and cerebral infarction without residual deficits: Secondary | ICD-10-CM

## 2017-09-09 LAB — HEMOGLOBIN A1C
Hgb A1c MFr Bld: 6.2 % — ABNORMAL HIGH (ref 4.8–5.6)
MEAN PLASMA GLUCOSE: 131.24 mg/dL

## 2017-09-09 LAB — LIPID PANEL
CHOL/HDL RATIO: 3.2 ratio
CHOLESTEROL: 120 mg/dL (ref 0–200)
HDL: 37 mg/dL — ABNORMAL LOW (ref >40)
LDL Cholesterol: 60 mg/dL (ref 0–99)
Triglycerides: 117 mg/dL (ref <150)
VLDL: 23 mg/dL (ref 0–40)

## 2017-09-09 LAB — CBC
HCT: 30.8 % — ABNORMAL LOW (ref 35.0–47.0)
HEMOGLOBIN: 10.4 g/dL — AB (ref 12.0–16.0)
MCH: 31.4 pg (ref 26.0–34.0)
MCHC: 33.7 g/dL (ref 32.0–36.0)
MCV: 93.2 fL (ref 80.0–100.0)
Platelets: 320 10*3/uL (ref 150–440)
RBC: 3.3 MIL/uL — AB (ref 3.80–5.20)
RDW: 12.5 % (ref 11.5–14.5)
WBC: 5.4 10*3/uL (ref 3.6–11.0)

## 2017-09-09 LAB — ECHOCARDIOGRAM COMPLETE
HEIGHTINCHES: 61 in
Weight: 2119 oz

## 2017-09-09 LAB — CREATININE, SERUM
CREATININE: 1.17 mg/dL — AB (ref 0.44–1.00)
GFR calc Af Amer: 48 mL/min — ABNORMAL LOW (ref >60)
GFR, EST NON AFRICAN AMERICAN: 41 mL/min — AB (ref >60)

## 2017-09-09 MED ORDER — ATORVASTATIN CALCIUM 20 MG PO TABS
40.0000 mg | ORAL_TABLET | Freq: Every day | ORAL | Status: DC
  Administered 2017-09-09 – 2017-09-12 (×4): 40 mg via ORAL
  Filled 2017-09-09 (×4): qty 2

## 2017-09-09 MED ORDER — IPRATROPIUM-ALBUTEROL 0.5-2.5 (3) MG/3ML IN SOLN
3.0000 mL | Freq: Four times a day (QID) | RESPIRATORY_TRACT | Status: DC
  Administered 2017-09-09 (×2): 3 mL via RESPIRATORY_TRACT
  Filled 2017-09-09 (×2): qty 3

## 2017-09-09 MED ORDER — IPRATROPIUM-ALBUTEROL 20-100 MCG/ACT IN AERS: 1.0000 | INHALATION_SPRAY | Freq: Four times a day (QID) | RESPIRATORY_TRACT | Status: DC

## 2017-09-09 MED ORDER — ASPIRIN-DIPYRIDAMOLE ER 25-200 MG PO CP12
1.0000 | ORAL_CAPSULE | Freq: Two times a day (BID) | ORAL | Status: DC
  Administered 2017-09-09 (×2): 1 via ORAL
  Filled 2017-09-09 (×3): qty 1

## 2017-09-09 MED ORDER — PANTOPRAZOLE SODIUM 40 MG PO TBEC
40.0000 mg | DELAYED_RELEASE_TABLET | Freq: Every day | ORAL | Status: DC
  Administered 2017-09-09 – 2017-09-13 (×5): 40 mg via ORAL
  Filled 2017-09-09 (×5): qty 1

## 2017-09-09 MED ORDER — ALPRAZOLAM 0.25 MG PO TABS: 0.2500 mg | ORAL_TABLET | Freq: Every evening | ORAL | Status: DC | PRN

## 2017-09-09 MED ORDER — LATANOPROST 0.005 % OP SOLN
1.0000 [drp] | Freq: Every day | OPHTHALMIC | Status: DC
  Administered 2017-09-09 – 2017-09-12 (×5): 1 [drp] via OPHTHALMIC
  Filled 2017-09-09: qty 2.5

## 2017-09-09 MED ORDER — LISINOPRIL 20 MG PO TABS: 40.0000 mg | ORAL_TABLET | Freq: Every day | ORAL | Status: DC

## 2017-09-09 MED ORDER — ACETAMINOPHEN 325 MG PO TABS: 650.0000 mg | ORAL_TABLET | ORAL | Status: DC | PRN

## 2017-09-09 MED ORDER — HEPARIN SODIUM (PORCINE) 5000 UNIT/ML IJ SOLN
5000.0000 [IU] | Freq: Three times a day (TID) | INTRAMUSCULAR | Status: DC
  Administered 2017-09-09 – 2017-09-13 (×13): 5000 [IU] via SUBCUTANEOUS
  Filled 2017-09-09 (×13): qty 1

## 2017-09-09 MED ORDER — IPRATROPIUM-ALBUTEROL 0.5-2.5 (3) MG/3ML IN SOLN
3.0000 mL | Freq: Three times a day (TID) | RESPIRATORY_TRACT | Status: DC
  Administered 2017-09-10: 08:00:00 3 mL via RESPIRATORY_TRACT
  Filled 2017-09-09: qty 3

## 2017-09-09 MED ORDER — GABAPENTIN 100 MG PO CAPS
100.0000 mg | ORAL_CAPSULE | Freq: Every day | ORAL | Status: DC
  Administered 2017-09-09 – 2017-09-12 (×5): 100 mg via ORAL
  Filled 2017-09-09 (×5): qty 1

## 2017-09-09 MED ORDER — SIMVASTATIN 20 MG PO TABS
20.0000 mg | ORAL_TABLET | Freq: Every day | ORAL | Status: DC
  Administered 2017-09-09: 20 mg via ORAL
  Filled 2017-09-09: qty 1

## 2017-09-09 MED ORDER — AMLODIPINE BESYLATE 5 MG PO TABS: 5.0000 mg | ORAL_TABLET | Freq: Every day | ORAL | Status: DC

## 2017-09-09 MED ORDER — ACETAMINOPHEN 160 MG/5ML PO SOLN: 650.0000 mg | ORAL | Status: DC | PRN

## 2017-09-09 MED ORDER — STROKE: EARLY STAGES OF RECOVERY BOOK
Freq: Once | Status: AC
  Administered 2017-09-09: 03:00:00

## 2017-09-09 MED ORDER — METOPROLOL SUCCINATE ER 50 MG PO TB24: 50.0000 mg | ORAL_TABLET | Freq: Every day | ORAL | Status: DC

## 2017-09-09 MED ORDER — BRINZOLAMIDE 1 % OP SUSP
1.0000 [drp] | Freq: Two times a day (BID) | OPHTHALMIC | Status: DC
  Administered 2017-09-09 – 2017-09-13 (×10): 1 [drp] via OPHTHALMIC
  Filled 2017-09-09: qty 10

## 2017-09-09 MED ORDER — ASPIRIN EC 81 MG PO TBEC
81.0000 mg | DELAYED_RELEASE_TABLET | Freq: Every day | ORAL | Status: DC
  Administered 2017-09-10 – 2017-09-13 (×4): 81 mg via ORAL
  Filled 2017-09-09 (×4): qty 1

## 2017-09-09 MED ORDER — ACETAMINOPHEN 650 MG RE SUPP: 650.0000 mg | RECTAL | Status: DC | PRN

## 2017-09-09 MED ORDER — PAROXETINE HCL 20 MG PO TABS
20.0000 mg | ORAL_TABLET | Freq: Every day | ORAL | Status: DC
  Administered 2017-09-09 – 2017-09-13 (×5): 20 mg via ORAL
  Filled 2017-09-09 (×5): qty 1

## 2017-09-09 MED ORDER — CLOPIDOGREL BISULFATE 75 MG PO TABS
75.0000 mg | ORAL_TABLET | Freq: Every day | ORAL | Status: DC
  Administered 2017-09-09 – 2017-09-13 (×5): 75 mg via ORAL
  Filled 2017-09-09 (×5): qty 1

## 2017-09-09 NOTE — Consult Note (Signed)
Referring Physician: Renae GlossWieting    Chief Complaint: LLE weakness  HPI: Sheppard Plumberrlania L Hascall is an 83 y.o. female with a history of stroke and residual left sided weakness who reports awakening at about 0430 on yesterday and having more trouble getting to the bathroom.  When she awakened again later in the morning had more difficulty using the LLE and her daughter feels she had some LUE weakness that was worse as well.  Patient also complained of numbness.  Patient was brought in for evaluation at that time.  Initial NIHSS of 2.  Date last known well: Date: 09/07/2017 Time last known well: Time: 20:00 tPA Given: No: Outside time window  Past Medical History:  Diagnosis Date  . Dementia   . Diabetes mellitus   . MI (myocardial infarction) (HCC)   . Pacemaker   . Stroke [facility](HCC)     Past Surgical History:  Procedure Laterality Date  . PACEMAKER INSERTION      Family History  Problem Relation Age of Onset  . Breast cancer Sister 2359   Social History:  reports that  has never smoked. she has never used smokeless tobacco. She reports that she does not drink alcohol or use drugs.  Allergies: No Known Allergies  Medications:  I have reviewed the patient's current medications. Prior to Admission:  Medications Prior to Admission  Medication Sig Dispense Refill Last Dose  . ALPRAZolam (XANAX) 0.25 MG tablet Take 0.25 mg by mouth at bedtime as needed for sleep.    09/07/2017 at Unknown time  . amLODipine (NORVASC) 5 MG tablet Take 5 mg by mouth daily.   09/08/2017 at Unknown time  . brinzolamide (AZOPT) 1 % ophthalmic suspension Place 1 drop into both eyes 2 (two) times daily.   09/08/2017 at Unknown time  . Cholecalciferol (VITAMIN D3) 2000 units capsule Take 2,000 Units by mouth daily.   09/08/2017 at Unknown time  . Cyanocobalamin (B-12) 500 MCG TABS Take 500 mcg by mouth 2 (two) times daily.   09/08/2017 at Unknown time  . dipyridamole-aspirin (AGGRENOX) 25-200 MG per 12 hr capsule Take 1  capsule by mouth 2 (two) times daily.   09/08/2017 at Unknown time  . gabapentin (NEURONTIN) 100 MG capsule Take 100 mg by mouth at bedtime.   09/07/2017 at Unknown time  . glipiZIDE (GLUCOTROL XL) 5 MG 24 hr tablet Take 5 mg by mouth daily.   09/08/2017 at Unknown time  . latanoprost (XALATAN) 0.005 % ophthalmic solution Place 1 drop into both eyes at bedtime.     Marland Kitchen. lisinopril (PRINIVIL,ZESTRIL) 40 MG tablet Take 40 mg by mouth daily.   09/08/2017 at Unknown time  . metFORMIN (GLUCOPHAGE) 500 MG tablet Take 500 mg by mouth 2 (two) times daily.   09/08/2017 at Unknown time  . metoprolol succinate (TOPROL-XL) 50 MG 24 hr tablet Take 50 mg by mouth daily. Take with or immediately following a meal.   09/08/2017 at Unknown time  . omeprazole (PRILOSEC) 20 MG capsule Take 20 mg by mouth daily.   09/08/2017 at Unknown time  . PARoxetine (PAXIL) 20 MG tablet Take 20 mg by mouth daily.     Marland Kitchen. POLYSACCHARIDE IRON COMPLEX PO Take 1 capsule by mouth daily.     . potassium chloride (K-DUR,KLOR-CON) 10 MEQ tablet Take 10 mEq by mouth daily.   09/08/2017 at Unknown time  . simvastatin (ZOCOR) 20 MG tablet Take 20 mg by mouth at bedtime.   09/07/2017 at Unknown time  . Vitamin D, Ergocalciferol, (  DRISDOL) 50000 units CAPS capsule Take 1 capsule by mouth once a week.     Marland Kitchen azithromycin (ZITHROMAX) 250 MG tablet Take 1 tablet (250 mg total) by mouth daily. (Patient not taking: Reported on 09/08/2017) 6 each 0 Completed Course at Unknown time  . chlorpheniramine-HYDROcodone (TUSSIONEX) 10-8 MG/5ML SUER Take 5 mLs by mouth every 12 (twelve) hours. (Patient not taking: Reported on 09/08/2017) 140 mL 0 Completed Course at Unknown time  . guaiFENesin (MUCINEX) 600 MG 12 hr tablet Take 1 tablet (600 mg total) by mouth 2 (two) times daily. (Patient not taking: Reported on 09/08/2017) 20 tablet 0 Not Taking at Unknown time  . Ipratropium-Albuterol (COMBIVENT RESPIMAT) 20-100 MCG/ACT AERS respimat Inhale 1 puff into the lungs  every 6 (six) hours. 1 Inhaler 5    Scheduled: . amLODipine  5 mg Oral Daily  . brinzolamide  1 drop Both Eyes BID  . dipyridamole-aspirin  1 capsule Oral BID  . gabapentin  100 mg Oral QHS  . heparin  5,000 Units Subcutaneous Q8H  . ipratropium-albuterol  3 mL Nebulization Q6H  . latanoprost  1 drop Both Eyes QHS  . lisinopril  40 mg Oral Daily  . metoprolol succinate  50 mg Oral Daily  . pantoprazole  40 mg Oral Daily  . PARoxetine  20 mg Oral Daily  . simvastatin  20 mg Oral QHS    ROS: History obtained from the patient  General ROS: negative for - chills, fatigue, fever, night sweats, weight gain or weight loss Psychological ROS: negative for - behavioral disorder, hallucinations, memory difficulties, mood swings or suicidal ideation Ophthalmic ROS: negative for - blurry vision, double vision, eye pain or loss of vision ENT ROS: negative for - epistaxis, nasal discharge, oral lesions, sore throat, tinnitus or vertigo Allergy and Immunology ROS: negative for - hives or itchy/watery eyes Hematological and Lymphatic ROS: negative for - bleeding problems, bruising or swollen lymph nodes Endocrine ROS: negative for - galactorrhea, hair pattern changes, polydipsia/polyuria or temperature intolerance Respiratory ROS: negative for - cough, hemoptysis, shortness of breath or wheezing Cardiovascular ROS: negative for - chest pain, dyspnea on exertion, edema or irregular heartbeat Gastrointestinal ROS: negative for - abdominal pain, diarrhea, hematemesis, nausea/vomiting or stool incontinence Genito-Urinary ROS: negative for - dysuria, hematuria, incontinence or urinary frequency/urgency Musculoskeletal ROS: left arm pain Neurological ROS: as noted in HPI Dermatological ROS: negative for rash and skin lesion changes  Physical Examination: Blood pressure (!) 94/43, pulse 89, temperature 98.8 F (37.1 C), temperature source Oral, resp. rate 18, height 5\' 1"  (1.549 m), weight 60.1 kg (132  lb 7 oz), SpO2 97 %.  HEENT-  Normocephalic, no lesions, without obvious abnormality.  Normal external eye and conjunctiva.  Normal TM's bilaterally.  Normal auditory canals and external ears. Normal external nose, mucus membranes and septum.  Normal pharynx. Cardiovascular- S1, S2 normal, pulses palpable throughout   Lungs- chest clear, no wheezing, rales, normal symmetric air entry Abdomen- soft, non-tender; bowel sounds normal; no masses,  no organomegaly Extremities- no edema Lymph-no adenopathy palpable Musculoskeletal-left shoulder pain to palpation and decreased left arm ROM Skin-warm and dry, no hyperpigmentation, vitiligo, or suspicious lesions  Neurological Examination   Mental Status: Alert, thought content appropriate.  Speech fluent without evidence of aphasia.  Able to follow 3 step commands without difficulty. Cranial Nerves: II: Discs flat bilaterally; Decreased peripheral vision bilaterally, pupils equal, round, reactive to light and accommodation III,IV, VI: ptosis not present, extra-ocular motions intact bilaterally V,VII: left facial droop, facial  light touch sensation normal bilaterally VIII: hearing decreased bilaterally IX,X: gag reflex present XI: bilateral shoulder shrug XII: midline tongue extension Motor: Right : Upper extremity   5/5    Left:     Upper extremity   4/5 with decreased ROM at the shoulder (able to get arm 45 degrees  Lower extremity   5-/5     Lower extremity   4-/5 Tone and bulk:normal tone throughout; no atrophy noted Sensory: Pinprick and light touch decreased in the LUE Deep Tendon Reflexes: 2+ on the right and 3+ on the right Plantars: Right: upgoing   Left: downgoing Cerebellar: Some impairment in finger-to-nose and normal heel-to-shin testing on the left due to strength Gait: not tested due to safety concerns  Laboratory Studies:  Basic Metabolic Panel: Recent Labs  Lab 09/08/17 2100 09/09/17 0410  NA 139  --   K 3.6  --   CL  108  --   CO2 25  --   GLUCOSE 155*  --   BUN 18  --   CREATININE 1.34* 1.17*  CALCIUM 9.1  --     Liver Function Tests: No results for input(s): AST, ALT, ALKPHOS, BILITOT, PROT, ALBUMIN in the last 168 hours. No results for input(s): LIPASE, AMYLASE in the last 168 hours. No results for input(s): AMMONIA in the last 168 hours.  CBC: Recent Labs  Lab 09/08/17 2100 09/09/17 0410  WBC 6.3 5.4  HGB 10.7* 10.4*  HCT 32.1* 30.8*  MCV 92.5 93.2  PLT 352 320    Cardiac Enzymes: Recent Labs  Lab 09/08/17 2100  TROPONINI <0.03    BNP: Invalid input(s): POCBNP  CBG: No results for input(s): GLUCAP in the last 168 hours.  Microbiology: Results for orders placed or performed during the hospital encounter of 09/17/16  Rapid Influenza A&B Antigens ([facility] only)     Status: None   Collection Time: 09/17/16  3:32 PM  Result Value Ref Range Status   Influenza A ([facility]) NEGATIVE NEGATIVE Final   Influenza B ([facility]) NEGATIVE NEGATIVE Final    Coagulation Studies: No results for input(s): LABPROT, INR in the last 72 hours.  Urinalysis:  Recent Labs  Lab 09/08/17 2309  COLORURINE YELLOW*  LABSPEC 1.017  PHURINE 5.0  GLUCOSEU NEGATIVE  HGBUR NEGATIVE  BILIRUBINUR NEGATIVE  KETONESUR NEGATIVE  PROTEINUR NEGATIVE  NITRITE NEGATIVE  LEUKOCYTESUR NEGATIVE    Lipid Panel:    Component Value Date/Time   CHOL 120 09/09/2017 0410   TRIG 117 09/09/2017 0410   HDL 37 (L) 09/09/2017 0410   CHOLHDL 3.2 09/09/2017 0410   VLDL 23 09/09/2017 0410   LDLCALC 60 09/09/2017 0410    HgbA1C:  Lab Results  Component Value Date   HGBA1C 6.2 (H) 09/09/2017    Urine Drug Screen:  No results found for: LABOPIA, COCAINSCRNUR, LABBENZ, AMPHETMU, THCU, LABBARB  Alcohol Level: No results for input(s): ETH in the last 168 hours.  Other results: EKG: sinus rhythm at 88 bpm.  Imaging: Ct Head Wo Contrast  Result Date: 09/08/2017 CLINICAL DATA:  83 year old female with focal  neurological deficit. EXAM: CT HEAD WITHOUT CONTRAST TECHNIQUE: Contiguous axial images were obtained from the base of the skull through the vertex without intravenous contrast. COMPARISON:  Head CT dated 03/05/2013 FINDINGS: Brain: There is moderate age-related atrophy and chronic microvascular ischemic changes. There is no acute intracranial hemorrhage. No mass effect or midline shift. No extra-axial fluid collection. Vascular: No hyperdense vessel or unexpected calcification. Skull: Normal. Negative for fracture or  focal lesion. Sinuses/Orbits: No acute finding. Other: None IMPRESSION: 1. No acute intracranial hemorrhage. 2. Moderate age-related atrophy and chronic microvascular ischemic changes. If symptoms persist, and there are no contraindications, MRI may provide better evaluation if clinically indicated. Electronically Signed   By: Elgie CollardArash  Radparvar M.D.   On: 09/08/2017 21:40   [place] Carotid Bilateral (at [facility] Only)  Result Date: 09/09/2017 CLINICAL DATA:  Left leg weakness. History of stroke, syncopal episode, hypertension, CAD, hyperlipidemia and diabetes. EXAM: BILATERAL CAROTID DUPLEX ULTRASOUND TECHNIQUE: Wallace CullensGray scale imaging, color Doppler and duplex ultrasound were performed of bilateral carotid and vertebral arteries in the neck. COMPARISON:  None. FINDINGS: Criteria: Quantification of carotid stenosis is based on velocity parameters that correlate the residual internal carotid diameter with NASCET-based stenosis levels, using the diameter of the distal internal carotid lumen as the denominator for stenosis measurement. The following velocity measurements were obtained: RIGHT ICA:  84/27 cm/sec CCA:  70/14 cm/sec SYSTOLIC ICA/CCA RATIO:  1.2 DIASTOLIC ICA/CCA RATIO:  2.0 ECA:  167 cm/sec LEFT ICA:  75/9 cm/sec CCA:  96/13 cm/sec SYSTOLIC ICA/CCA RATIO:  0.8 DIASTOLIC ICA/CCA RATIO:  1.4 ECA:  93 cm/sec RIGHT CAROTID ARTERY: There is a minimal amount of echogenic atherosclerotic plaque within  the right carotid bulb (image 16), extending to involve the origin and proximal aspects of the right internal carotid artery (image 23), not resulting in elevated peak systolic velocities within the interrogated course the right internal carotid artery to suggest a hemodynamically significant stenosis. RIGHT VERTEBRAL ARTERY:  Antegrade flow LEFT CAROTID ARTERY: There is a minimal amount of echogenic atherosclerotic plaque within the left carotid bulb (image 48), extending to involve the origin and proximal aspects of the left internal carotid artery (image 56), not resulting in elevated peak systolic velocities within the interrogated course the left internal carotid artery to suggest a hemodynamically significant stenosis. LEFT VERTEBRAL ARTERY:  Antegrade flow IMPRESSION: Minimal amount of bilateral atherosclerotic plaque, left subjectively greater than right, not resulting in a hemodynamically significant stenosis within either internal carotid artery. Electronically Signed   By: Simonne ComeJohn  Watts M.D.   On: 09/09/2017 08:28   Dg Chest Portable 1 View  Result Date: 09/08/2017 CLINICAL DATA:  83 year old female with left lower extremity weakness. EXAM: PORTABLE CHEST 1 VIEW COMPARISON:  Chest CT dated 09/17/2016 FINDINGS: Shallow inspiration with minimal bibasilar atelectasis. No focal consolidation, pleural effusion, or pneumothorax. There is atherosclerotic calcification of the aortic arch. Left pectoral dual lead pacemaker device. Osteopenia with degenerative changes of the spine. No acute osseous pathology. IMPRESSION: No active disease. Electronically Signed   By: Elgie CollardArash  Radparvar M.D.   On: 09/08/2017 22:12    Assessment: 83 y.o. female with a history of stroke and residual left hemiparesis presenting with worsening left sided weakness.  Head CT reviewed and shows no acute changes.  MRI unable to be performed due to pacer.  Patient on Aggrenox at home and daughter reports that she takes ASA as well on a  daily basis.  Carotid dopplers show no evidence of hemodynamically significant stenosis.  Echocardiogram pending.  A1c 6.2, LDL 60.  Patient on a statin.  Patient has likely had another infarct secondary to small vessel disease.  Agree with continued stroke work up.    Stroke Risk Factors - diabetes mellitus  Plan: 1. PT consult, OT consult, Speech consult 2. Echocardiogram pending 3. Prophylactic therapy-Antiplatelet med: Plavix - dose 75mg  daily.  Would discontinue Aggrenox.  Agree with continued statin use. 4. Telemetry monitoring 5.  Frequent neuro checks   Thana Farr, MD Neurology [phone] 09/09/2017, 11:10 AM

## 2017-09-09 NOTE — ED Provider Notes (Signed)
I assumed care of the patient from Dr. Roxan Hockeyobinson 11:00 PM.  Patient admits to "dragging my left leg".  Patient admits to increasing weakness on the left side.  Urinalysis feels no evidence of urinary tract infection.  CT head revealed no evidence of acute intracranial abnormality.  Patient discussed with Dr. Anne HahnWillis for hospital admission for further evaluation and management for potential CVA   Darci CurrentBrown, [place] N, MD 09/09/17 [phone]

## 2017-09-09 NOTE — Progress Notes (Signed)
*  PRELIMINARY RESULTS* Echocardiogram 2D Echocardiogram has been performed.  Cristela BlueHege, Malakie Balis 09/09/2017, 2:56 PM

## 2017-09-09 NOTE — Evaluation (Signed)
Speech Language Pathology Screening Patient Details Name: Jessica Gardner Decou MRN: [id number] DOB: 11-17-1929 Today's Date: 09/09/2017 Time: [id number] SLP Time Calculation (min) (ACUTE ONLY): 10 min   SLP screened, no need for skilled interventions at this time. SLP educated nursing to notify slp if changes in condition for speech or swallowing occur. [facility] family states pt is at baseline. SLP will sign off.   Problem List:  Patient Active Problem List   Diagnosis Date Noted  . Left leg weakness 09/09/2017  . Diabetes (HCC) 09/09/2017  . Dementia 09/09/2017  . History of stroke 09/09/2017  . Acute bronchitis 09/18/2016  . Elevated troponin 09/18/2016  . Anemia 09/18/2016  . Renal insufficiency 09/18/2016  . Generalized weakness 09/18/2016  . Chest pain 09/17/2016   Past Medical History:  Past Medical History:  Diagnosis Date  . Dementia   . Diabetes mellitus   . MI (myocardial infarction) (HCC)   . Pacemaker   . Stroke [facility](HCC)    Past Surgical History:  Past Surgical History:  Procedure Laterality Date  . PACEMAKER INSERTION                     Meredith PelStacie Harris Sauber 09/09/2017, 1:06 PM

## 2017-09-09 NOTE — ED Notes (Signed)
Pt transported to room 105 

## 2017-09-09 NOTE — H&P (Signed)
[facility]   PATIENT NAME: Jessica Gardner    MR#:  [id number]  DATE OF BIRTH:  [birth year]  DATE OF ADMISSION:  09/08/2017  PRIMARY CARE PHYSICIAN: Barbette Reichmann, MD   REQUESTING/REFERRING PHYSICIAN: Roxan Hockey, MD  CHIEF COMPLAINT:   Chief Complaint  Patient presents with  . Extremity Weakness    HISTORY OF PRESENT ILLNESS:  Jessica Gardner  is a 83 y.o. female who presents with increased left lower extremity weakness.  Patient states she woke up and noticed that her left leg was significantly weaker.  This made it difficult for her to ambulate.  She does have a history of prior stroke after which she had some residual left leg weakness.  However, she was able to ambulate without difficulty.  That stroke occurred 4-5 years ago.  Workup in the ED was initially within normal limits, but given a concern for persistent symptoms and risk for recurrent stroke hospitalist were called for admission  PAST MEDICAL HISTORY:   Past Medical History:  Diagnosis Date  . Dementia   . Diabetes mellitus   . MI (myocardial infarction) (HCC)   . Pacemaker   . Stroke [facility])     PAST SURGICAL HISTORY:   Past Surgical History:  Procedure Laterality Date  . PACEMAKER INSERTION      SOCIAL HISTORY:   Social History   Tobacco Use  . Smoking status: Never Smoker  . Smokeless tobacco: Never Used  Substance Use Topics  . Alcohol use: No    FAMILY HISTORY:   Family History  Problem Relation Age of Onset  . Breast cancer Sister 1    DRUG ALLERGIES:  No Known Allergies  MEDICATIONS AT HOME:   Prior to Admission medications   Medication Sig Start Date End Date Taking? Authorizing Provider  ALPRAZolam Prudy Feeler) 0.25 MG tablet Take 0.25 mg by mouth at bedtime as needed for sleep.  09/14/16  Yes [provider]  amLODipine (NORVASC) 5 MG tablet Take 5 mg by mouth daily.   Yes [provider]  brinzolamide (AZOPT) 1 %  ophthalmic suspension Place 1 drop into both eyes 2 (two) times daily.   Yes [provider]  Cholecalciferol (VITAMIN D3) 2000 units capsule Take 2,000 Units by mouth daily.   Yes [provider]  Cyanocobalamin (B-12) 500 MCG TABS Take 500 mcg by mouth 2 (two) times daily. 10/14/14  Yes [provider]  dipyridamole-aspirin (AGGRENOX) 25-200 MG per 12 hr capsule Take 1 capsule by mouth 2 (two) times daily.   Yes [provider]  gabapentin (NEURONTIN) 100 MG capsule Take 100 mg by mouth at bedtime.   Yes [provider]  glipiZIDE (GLUCOTROL XL) 5 MG 24 hr tablet Take 5 mg by mouth daily. 08/25/16  Yes [provider]  latanoprost (XALATAN) 0.005 % ophthalmic solution Place 1 drop into both eyes at bedtime.   Yes [provider]  lisinopril (PRINIVIL,ZESTRIL) 40 MG tablet Take 40 mg by mouth daily.   Yes [provider]  metFORMIN (GLUCOPHAGE) 500 MG tablet Take 500 mg by mouth 2 (two) times daily.   Yes [provider]  metoprolol succinate (TOPROL-XL) 50 MG 24 hr tablet Take 50 mg by mouth daily. Take with or immediately following a meal.   Yes [provider]  omeprazole (PRILOSEC) 20 MG capsule Take 20 mg by mouth daily.   Yes [provider]  PARoxetine (PAXIL) 20 MG tablet Take 20 mg by mouth  daily.   Yes [provider]  POLYSACCHARIDE IRON COMPLEX PO Take 1 capsule by mouth daily.   Yes [provider]  potassium chloride (K-DUR,KLOR-CON) 10 MEQ tablet Take 10 mEq by mouth daily.   Yes [provider]  simvastatin (ZOCOR) 20 MG tablet Take 20 mg by mouth at bedtime.   Yes [provider]  Vitamin D, Ergocalciferol, (DRISDOL) 50000 units CAPS capsule Take 1 capsule by mouth once a week.   Yes [provider]  azithromycin (ZITHROMAX) 250 MG tablet Take 1 tablet (250 mg total) by mouth daily. Patient not taking: Reported on 09/08/2017 09/18/16    Katharina CaperVaickute, Rima, MD  chlorpheniramine-HYDROcodone (TUSSIONEX) 10-8 MG/5ML SUER Take 5 mLs by mouth every 12 (twelve) hours. Patient not taking: Reported on 09/08/2017 09/18/16   Katharina CaperVaickute, Rima, MD  guaiFENesin (MUCINEX) 600 MG 12 hr tablet Take 1 tablet (600 mg total) by mouth 2 (two) times daily. Patient not taking: Reported on 09/08/2017 09/18/16   Katharina CaperVaickute, Rima, MD  Ipratropium-Albuterol (COMBIVENT RESPIMAT) 20-100 MCG/ACT AERS respimat Inhale 1 puff into the lungs every 6 (six) hours. 09/18/16   Katharina CaperVaickute, Rima, MD    REVIEW OF SYSTEMS:  Review of Systems  Constitutional: Negative for chills, fever, malaise/fatigue and weight loss.  HENT: Negative for ear pain, hearing loss and tinnitus.   Eyes: Negative for blurred vision, double vision, pain and redness.  Respiratory: Negative for cough, hemoptysis and shortness of breath.   Cardiovascular: Negative for chest pain, palpitations, orthopnea and leg swelling.  Gastrointestinal: Negative for abdominal pain, constipation, diarrhea, nausea and vomiting.  Genitourinary: Negative for dysuria, frequency and hematuria.  Musculoskeletal: Negative for back pain, joint pain and neck pain.  Skin:       No acne, rash, or lesions  Neurological: Positive for focal weakness. Negative for dizziness, tremors and weakness.  Endo/Heme/Allergies: Negative for polydipsia. Does not bruise/bleed easily.  Psychiatric/Behavioral: Negative for depression. The patient is not nervous/anxious and does not have insomnia.      VITAL SIGNS:   Vitals:   09/08/17 2300 09/08/17 2324 09/09/17 0000 09/09/17 0030  BP: 101/66  111/62 133/88  Pulse:   92   Resp: 15  13 19   Temp:  97.7 F (36.5 C)    TempSrc:      SpO2: 97%  96%   Weight:      Height:       Wt Readings from Last 3 Encounters:  09/08/17 59.9 kg (132 lb)  12/18/16 68 kg (150 lb)  09/18/16 66 kg (145 lb 8 oz)    PHYSICAL EXAMINATION:  Physical Exam  Vitals reviewed. Constitutional: She is oriented  to person, place, and time. She appears well-developed and well-nourished. No distress.  HENT:  Head: Normocephalic and atraumatic.  Mouth/Throat: Oropharynx is clear and moist.  Eyes: Conjunctivae and EOM are normal. Pupils are equal, round, and reactive to light. No scleral icterus.  Neck: Normal range of motion. Neck supple. No JVD present. No thyromegaly present.  Cardiovascular: Normal rate, regular rhythm and intact distal pulses. Exam reveals no gallop and no friction rub.  No murmur heard. Respiratory: Effort normal and breath sounds normal. No respiratory distress. She has no wheezes. She has no rales.  GI: Soft. Bowel sounds are normal. She exhibits no distension. There is no tenderness.  Musculoskeletal: Normal range of motion. She exhibits no edema.  No arthritis, no gout  Lymphadenopathy:    She has no cervical adenopathy.  Neurological: She is alert and oriented to  person, place, and time. No cranial nerve deficit.  Neurologic: Cranial nerves II-XII intact, Sensation intact to light touch/pinprick, 5/5 strength in right extremities, left upper extremity weak though patient states this is due to relatively recent injury as she fell in that shoulder, left lower extremity weak 4/5 strength, no dysarthria, no aphasia, no dysphagia, memory intact   Skin: Skin is warm and dry. No rash noted. No erythema.  Psychiatric: She has a normal mood and affect. Her behavior is normal. Judgment and thought content normal.    LABORATORY PANEL:   CBC Recent Labs  Lab 09/08/17 2100  WBC 6.3  HGB 10.7*  HCT 32.1*  PLT 352   ------------------------------------------------------------------------------------------------------------------  Chemistries  Recent Labs  Lab 09/08/17 2100  NA 139  K 3.6  CL 108  CO2 25  GLUCOSE 155*  BUN 18  CREATININE 1.34*  CALCIUM 9.1    ------------------------------------------------------------------------------------------------------------------  Cardiac Enzymes Recent Labs  Lab 09/08/17 2100  TROPONINI <0.03   ------------------------------------------------------------------------------------------------------------------  RADIOLOGY:  Ct Head Wo Contrast  Result Date: 09/08/2017 CLINICAL DATA:  83 year old female with focal neurological deficit. EXAM: CT HEAD WITHOUT CONTRAST TECHNIQUE: Contiguous axial images were obtained from the base of the skull through the vertex without intravenous contrast. COMPARISON:  Head CT dated 03/05/2013 FINDINGS: Brain: There is moderate age-related atrophy and chronic microvascular ischemic changes. There is no acute intracranial hemorrhage. No mass effect or midline shift. No extra-axial fluid collection. Vascular: No hyperdense vessel or unexpected calcification. Skull: Normal. Negative for fracture or focal lesion. Sinuses/Orbits: No acute finding. Other: None IMPRESSION: 1. No acute intracranial hemorrhage. 2. Moderate age-related atrophy and chronic microvascular ischemic changes. If symptoms persist, and there are no contraindications, MRI may provide better evaluation if clinically indicated. Electronically Signed   By: Elgie CollardArash  Radparvar M.D.   On: 09/08/2017 21:40   Dg Chest Portable 1 View  Result Date: 09/08/2017 CLINICAL DATA:  83 year old female with left lower extremity weakness. EXAM: PORTABLE CHEST 1 VIEW COMPARISON:  Chest CT dated 09/17/2016 FINDINGS: Shallow inspiration with minimal bibasilar atelectasis. No focal consolidation, pleural effusion, or pneumothorax. There is atherosclerotic calcification of the aortic arch. Left pectoral dual lead pacemaker device. Osteopenia with degenerative changes of the spine. No acute osseous pathology. IMPRESSION: No active disease. Electronically Signed   By: Elgie CollardArash  Radparvar M.D.   On: 09/08/2017 22:12    EKG:   Orders placed  or performed during the hospital encounter of 09/08/17  . EKG 12-Lead  . EKG 12-Lead  . ED EKG  . ED EKG    IMPRESSION AND PLAN:  Principal Problem:   Left leg weakness -admit per stroke admission order set with neurology consult and appropriate lab orders.  Patient will need further imaging but it is unclear whether she can get an MRI or not as she does have a pacemaker.  She states the pacemaker was placed 4-5 years ago, compatibility with MRI will need to be research. Active Problems:   History of stroke -strong suspicion for recurrent stroke, workup as above, continue current medications   Diabetes (HCC) -sliding scale insulin with corresponding glucose checks   Dementia -continue home meds   GERD -home dose PPI  All the records are reviewed and case discussed with ED provider. Management plans discussed with the patient and/or family.  DVT PROPHYLAXIS: SubQ heparin  GI PROPHYLAXIS: PPI  ADMISSION STATUS: Observation  CODE STATUS: Full Code Status History    Date Active Date Inactive Code Status Order ID Comments User Context  09/17/2016 15:57 09/18/2016 17:28 Full Code [id number]  Katha Hamming, MD ED    Advance Directive Documentation     Most Recent Value  Type of Advance Directive  Healthcare Power of Attorney, Living will  Pre-existing out of facility DNR order (yellow form or pink MOST form)  No data  "MOST" Form in Place?  No data      TOTAL TIME TAKING CARE OF THIS PATIENT: 40 minutes.   Doyle Tegethoff FIELDING 09/09/2017, 12:40 AM  [facility]  Office  [phone]  CC: Primary care physician; Barbette Reichmann, MD  Note:  This document was prepared using Dragon voice recognition software and may include unintentional dictation errors.

## 2017-09-09 NOTE — Evaluation (Signed)
Physical Therapy Evaluation Patient Details Name: Jessica Gardner MRN: [id number] DOB: December 10, 1929 Today's Date: 09/09/2017   History of Present Illness  83 yo Female came to ED with left LE weakness; Patient has PMH significant for CVA 4-5 years ago with left sided weakness; However she reports that her left leg has progressively gotten worse; She is not safe for MRI due to pacemaker; recent CT scan was negative for acute abnormality; However MD reports that she likely has another infarct due to small vessel disease;   Clinical Impression  83 yo Female came to ED with progressive left LE weakness; Patient lives at home with her son. Her son assists her with ADLs prn; She also has a home aide that comes every other day to assist with bathing and dressing. Patient uses a rollator for all walking at home. She would use a manual wheelchair when leaving her house. Patient is currently min A for bed mobility, mod A for sit<>stand transfers with RW and requires mod A for taking a few steps to bedside chair with RW. She exhibits increased weakness in both legs grossly 3-/5; In addition she exhibits poor balance. Patient would benefit from skilled PT intervention to improve strength, balance and gait safety; Recommend SNF rehab upon discharge as patient is not safe to enter/exit home at this time based on weakness and number of stairs to enter home.     Follow Up Recommendations SNF;Supervision/Assistance - 24 hour    Equipment Recommendations  Other (comment)(to be determined post acute)    Recommendations for Other Services Rehab consult     Precautions / Restrictions Precautions Precautions: Fall Restrictions Weight Bearing Restrictions: No      Mobility  Bed Mobility Overal bed mobility: Needs Assistance Bed Mobility: Rolling;Supine to Sit Rolling: Min assist   Supine to sit: Min assist     General bed mobility comments: with railings and elevated head of bed;   Transfers Overall  transfer level: Needs assistance Equipment used: Rolling walker (2 wheeled) Transfers: Sit to/from Stand Sit to Stand: Mod assist         General transfer comment: with cues for forward weight shift and to increase push through LE;   Ambulation/Gait Ambulation/Gait assistance: Mod assist Ambulation Distance (Feet): 2 Feet Assistive device: Rolling walker (2 wheeled) Gait Pattern/deviations: Step-to pattern;Decreased step length - right;Decreased step length - left;Decreased dorsiflexion - right;Decreased dorsiflexion - left;Decreased weight shift to left;Shuffle;Narrow base of support Gait velocity: decreased   General Gait Details: took a few steps to bedside chair requiring mod A for safety with short shuffled steps and unsteadiness;   Stairs            Wheelchair Mobility    Modified Rankin (Stroke Patients Only)       Balance Overall balance assessment: Needs assistance Sitting-balance support: Bilateral upper extremity supported;Feet supported Sitting balance-Leahy Scale: Fair     Standing balance support: Bilateral upper extremity supported Standing balance-Leahy Scale: Poor                               Pertinent Vitals/Pain Pain Assessment: No/denies pain    Home Living Family/patient expects to be discharged to:: Private residence Living Arrangements: Children Available Help at Discharge: Family;Available 24 hours/day Type of Home: House Home Access: Stairs to enter Entrance Stairs-Rails: Doctor, general practiceight;Left Entrance Stairs-Number of Steps: 4 Home Layout: One level Home Equipment: Walker - 4 wheels;Wheelchair - manual  Prior Function Level of Independence: Needs assistance   Gait / Transfers Assistance Needed: used Rollator when walking in the house; would use wheelchair when leaving home for doctor appointments etc;   ADL's / Homemaking Assistance Needed: son helped with homemaking; patient has home aide that comes every other day to  assist with bathing/dressing and meal prep  Comments: would need help negotiating steps at home;      Hand Dominance        Extremity/Trunk Assessment   Upper Extremity Assessment Upper Extremity Assessment: Overall WFL for tasks assessed    Lower Extremity Assessment Lower Extremity Assessment: RLE deficits/detail;LLE deficits/detail RLE Deficits / Details: intact light touch sensation; hip strength: 3/5, knee 3+/5, ankle 3/5 LLE Deficits / Details: intact light touch sensation; hip strength: 3/5, knee 3/5, ankle 3/5    Cervical / Trunk Assessment Cervical / Trunk Assessment: Kyphotic  Communication   Communication: No difficulties  Cognition Arousal/Alertness: Awake/alert Behavior During Therapy: WFL for tasks assessed/performed Overall Cognitive Status: Within Functional Limits for tasks assessed                                        General Comments      Exercises Other Exercises Other Exercises: Instructed patient in LE strengthening exercise: seated: march x10, LAQ x10, ankle DF/PF x10 bilaterally with cues for increased ROM and to increase hold time for better strengthening; Patient able to verbalize and demonstrate understanding;    Assessment/Plan    PT Assessment Patient needs continued PT services  PT Problem List Decreased strength;Decreased mobility;Decreased safety awareness;Decreased knowledge of precautions;Decreased activity tolerance;Decreased knowledge of use of DME;Decreased balance       PT Treatment Interventions DME instruction;Therapeutic activities;Gait training;Therapeutic exercise;Patient/family education;Stair training;Balance training;Functional mobility training;Neuromuscular re-education    PT Goals (Current goals can be found in the Care Plan section)  Acute Rehab PT Goals Patient Stated Goal: "I want to go home."  PT Goal Formulation: With patient Time For Goal Achievement: 09/23/17 Potential to Achieve Goals:  Fair    Frequency 7X/week   Barriers to discharge Inaccessible home environment has 4 steps to enter house; does not exhibit adequate strength to be able to negotiate steps;     Co-evaluation               AM-PAC PT "6 Clicks" Daily Activity  Outcome Measure Difficulty turning over in bed (including adjusting bedclothes, sheets and blankets)?: A Lot Difficulty moving from lying on back to sitting on the side of the bed? : Unable Difficulty sitting down on and standing up from a chair with arms (e.g., wheelchair, bedside commode, etc,.)?: Unable Help needed moving to and from a bed to chair (including a wheelchair)?: A Lot Help needed walking in hospital room?: A Lot Help needed climbing 3-5 steps with a railing? : Total 6 Click Score: 9    End of Session Equipment Utilized During Treatment: Gait belt Activity Tolerance: Patient tolerated treatment well;No increased pain Patient left: in chair;with chair alarm set;with call bell/phone within reach Nurse Communication: Mobility status PT Visit Diagnosis: Unsteadiness on feet (R26.81);Muscle weakness (generalized) (M62.81)    Time: [id number] PT Time Calculation (min) (ACUTE ONLY): 28 min   Charges:   PT Evaluation $PT Eval Low Complexity: 1 Low PT Treatments $Therapeutic Exercise: 8-22 mins   PT G Codes:   PT G-Codes **NOT FOR INPATIENT CLASS** Functional Assessment Tool Used: AM-PAC  6 Clicks Basic Mobility Functional Limitation: Mobility: Walking and moving around Mobility: Walking and Moving Around Current Status [phone]): At least 60 percent but less than 80 percent impaired, limited or restricted Mobility: Walking and Moving Around Goal Status [phone]): At least 40 percent but less than 60 percent impaired, limited or restricted      Jiraiya Mcewan PT, DPT 09/09/2017, 5:14 PM

## 2017-09-09 NOTE — Plan of Care (Signed)
  Progressing Education: Knowledge of General Education information will improve 09/09/2017 1527 - Progressing by Kathreen CosierMalcolm, Cassidey Barrales A, RN Health Behavior/Discharge Planning: Ability to manage health-related needs will improve 09/09/2017 1527 - Progressing by Kathreen CosierMalcolm, Randen Kauth A, RN Clinical Measurements: Ability to maintain clinical measurements within normal limits will improve 09/09/2017 1527 - Progressing by Kathreen CosierMalcolm, Abdelaziz Westenberger A, RN Will remain free from infection 09/09/2017 1527 - Progressing by Kathreen CosierMalcolm, Jerusalen Mateja A, RN Diagnostic test results will improve 09/09/2017 1527 - Progressing by Kathreen CosierMalcolm, Tihanna Goodson A, RN Respiratory complications will improve 09/09/2017 1527 - Progressing by Kathreen CosierMalcolm, Belisa Eichholz A, RN Cardiovascular complication will be avoided 09/09/2017 1527 - Progressing by Kathreen CosierMalcolm, Maelie Chriswell A, RN Activity: Risk for activity intolerance will decrease 09/09/2017 1527 - Progressing by Kathreen CosierMalcolm, Norton Bivins A, RN Nutrition: Adequate nutrition will be maintained 09/09/2017 1527 - Progressing by Kathreen CosierMalcolm, Kina Shiffman A, RN Coping: Level of anxiety will decrease 09/09/2017 1527 - Progressing by Kathreen CosierMalcolm, Quinnetta Roepke A, RN Elimination: Will not experience complications related to bowel motility 09/09/2017 1527 - Progressing by Kathreen CosierMalcolm, Odalys Win A, RN Will not experience complications related to urinary retention 09/09/2017 1527 - Progressing by Kathreen CosierMalcolm, Aldine Grainger A, RN Pain Managment: General experience of comfort will improve 09/09/2017 1527 - Progressing by Kathreen CosierMalcolm, Jaylenn Baiza A, RN Safety: Ability to remain free from injury will improve 09/09/2017 1527 - Progressing by Kathreen CosierMalcolm, Teyla Skidgel A, RN Skin Integrity: Risk for impaired skin integrity will decrease 09/09/2017 1527 - Progressing by Kathreen CosierMalcolm, Gaylynn Seiple A, RN Education: Knowledge of disease or condition will improve 09/09/2017 1527 - Progressing by Kathreen CosierMalcolm, Ediel Unangst A, RN Knowledge of secondary prevention will improve 09/09/2017 1527 - Progressing by Kathreen CosierMalcolm, Abbigaile Rockman A,  RN Knowledge of patient specific risk factors addressed and post discharge goals established will improve 09/09/2017 1527 - Progressing by Kathreen CosierMalcolm, Cleatus Goodin A, RN Coping: Will verbalize positive feelings about self 09/09/2017 1527 - Progressing by Kathreen CosierMalcolm, Zacherie Honeyman A, RN Will identify appropriate support needs 09/09/2017 1527 - Progressing by Kathreen CosierMalcolm, Markcus Lazenby A, RN Self-Care: Ability to participate in self-care as condition permits will improve 09/09/2017 1527 - Progressing by Kathreen CosierMalcolm, Kimiyah Blick A, RN

## 2017-09-09 NOTE — Care Management Note (Signed)
Case Management Note  Patient Details  Name: Jessica Gardner MRN: [id number] Date of Birth: 04-19-1930  Subjective/Objective:  Admitted to [facility] under observation status with the diagnosis of left leg weakness. Son lives in the home. Daughter is Jessica Gardner [phone] Sees Dr. Marcello FennelHande as primary care physician. [facility]. No home Health. No skilled facility. No home oxygen. Rolling walker and wheelchair in the home. Self feed. Needs help with baths and dressing. Family helps. Last fall was 6 months ago. Good appetite. Family will transport                 Action/Plan:. Stroke work-up in progress.   Expected Discharge Date:                  Expected Discharge Plan:     In-House Referral:   yes  Discharge planning Services     Post Acute Care Choice:    Choice offered to:     DME Arranged:    DME Agency:     HH Arranged:    HH Agency:     Status of Service:     If discussed at [employer] Meetings, dates discussed:    Additional Comments:  Gwenette GreetBrenda S Janaiya Beauchesne, RN MSN CCM Care Management [phone] 09/09/2017, 9:59 AM

## 2017-09-09 NOTE — Progress Notes (Signed)
Patient ID: Jessica Gardner, female   DOB: 18-Jul-1930, 83 y.o.   MRN: [id number]  [employer] [facility] PROGRESS NOTE  Jessica Gardner YQM:[id number] DOB: October 13, 1929 DOA: 09/08/2017 PCP: Barbette Reichmann, MD  HPI/Subjective: Patient came in with left-sided numbness.  Some weakness of the left leg.  Patient stated that she had a fall 6 months ago on her shoulder and her shoulders been bothering her since then.  Difficulty moving her left arm up.  Objective: Vitals:   09/09/17 1037 09/09/17 1231  BP: (!) 94/43 (!) 95/45  Pulse: 89 85  Resp: 18 18  Temp: 98.8 F (37.1 C) 98.2 F (36.8 C)  SpO2: 97% 99%    Filed Weights   09/08/17 2107 09/09/17 0150  Weight: 59.9 kg (132 lb) 60.1 kg (132 lb 7 oz)    ROS: Review of Systems  Constitutional: Negative for chills and fever.  Eyes: Negative for blurred vision.  Respiratory: Negative for cough and shortness of breath.   Cardiovascular: Negative for chest pain.  Gastrointestinal: Negative for abdominal pain, constipation, diarrhea, nausea and vomiting.  Genitourinary: Negative for dysuria.  Musculoskeletal: Negative for joint pain.  Neurological: Negative for dizziness and headaches.   Exam: Physical Exam  Constitutional: She is oriented to person, place, and time.  HENT:  Nose: No mucosal edema.  Mouth/Throat: No oropharyngeal exudate or posterior oropharyngeal edema.  Eyes: Conjunctivae, EOM and lids are normal. Pupils are equal, round, and reactive to light.  Neck: No JVD present. Carotid bruit is not present. No edema present. No thyroid mass and no thyromegaly present.  Cardiovascular: S1 normal and S2 normal. Exam reveals no gallop.  No murmur heard. Pulses:      Dorsalis pedis pulses are 2+ on the right side, and 2+ on the left side.  Respiratory: No respiratory distress. She has no wheezes. She has no rhonchi. She has no rales.  GI: Soft. Bowel sounds are normal. There is no tenderness.  Musculoskeletal:       Left  shoulder: She exhibits decreased range of motion, tenderness and swelling.       Right ankle: She exhibits no swelling.       Left ankle: She exhibits no swelling.  Lymphadenopathy:    She has no cervical adenopathy.  Neurological: She is alert and oriented to person, place, and time. No cranial nerve deficit.  Left shoulder pain to palpation.  Likely frozen shoulder.  Patient sensation seems like it is back to light touch on the left leg.  Babinski positive left leg.  Power 4 out of 5 on the left upper and lower extremity.  5 out of 5 on the right side.  Skin: Skin is warm. No rash noted. Nails show no clubbing.  Psychiatric: She has a normal mood and affect.      Data Reviewed: Basic Metabolic Panel: Recent Labs  Lab 09/08/17 2100 09/09/17 0410  NA 139  --   K 3.6  --   CL 108  --   CO2 25  --   GLUCOSE 155*  --   BUN 18  --   CREATININE 1.34* 1.17*  CALCIUM 9.1  --    CBC: Recent Labs  Lab 09/08/17 2100 09/09/17 0410  WBC 6.3 5.4  HGB 10.7* 10.4*  HCT 32.1* 30.8*  MCV 92.5 93.2  PLT 352 320   Cardiac Enzymes: Recent Labs  Lab 09/08/17 2100  TROPONINI <0.03   BNP (last 3 results) Recent Labs    09/17/16 1321  BNP  41.0      Studies: Ct Head Wo Contrast  Result Date: 09/08/2017 CLINICAL DATA:  83 year old female with focal neurological deficit. EXAM: CT HEAD WITHOUT CONTRAST TECHNIQUE: Contiguous axial images were obtained from the base of the skull through the vertex without intravenous contrast. COMPARISON:  Head CT dated 03/05/2013 FINDINGS: Brain: There is moderate age-related atrophy and chronic microvascular ischemic changes. There is no acute intracranial hemorrhage. No mass effect or midline shift. No extra-axial fluid collection. Vascular: No hyperdense vessel or unexpected calcification. Skull: Normal. Negative for fracture or focal lesion. Sinuses/Orbits: No acute finding. Other: None IMPRESSION: 1. No acute intracranial hemorrhage. 2. Moderate  age-related atrophy and chronic microvascular ischemic changes. If symptoms persist, and there are no contraindications, MRI may provide better evaluation if clinically indicated. Electronically Signed   By: Elgie CollardArash  Radparvar M.D.   On: 09/08/2017 21:40   [place] Carotid Bilateral (at [facility] Only)  Result Date: 09/09/2017 CLINICAL DATA:  Left leg weakness. History of stroke, syncopal episode, hypertension, CAD, hyperlipidemia and diabetes. EXAM: BILATERAL CAROTID DUPLEX ULTRASOUND TECHNIQUE: Wallace CullensGray scale imaging, color Doppler and duplex ultrasound were performed of bilateral carotid and vertebral arteries in the neck. COMPARISON:  None. FINDINGS: Criteria: Quantification of carotid stenosis is based on velocity parameters that correlate the residual internal carotid diameter with NASCET-based stenosis levels, using the diameter of the distal internal carotid lumen as the denominator for stenosis measurement. The following velocity measurements were obtained: RIGHT ICA:  84/27 cm/sec CCA:  70/14 cm/sec SYSTOLIC ICA/CCA RATIO:  1.2 DIASTOLIC ICA/CCA RATIO:  2.0 ECA:  167 cm/sec LEFT ICA:  75/9 cm/sec CCA:  96/13 cm/sec SYSTOLIC ICA/CCA RATIO:  0.8 DIASTOLIC ICA/CCA RATIO:  1.4 ECA:  93 cm/sec RIGHT CAROTID ARTERY: There is a minimal amount of echogenic atherosclerotic plaque within the right carotid bulb (image 16), extending to involve the origin and proximal aspects of the right internal carotid artery (image 23), not resulting in elevated peak systolic velocities within the interrogated course the right internal carotid artery to suggest a hemodynamically significant stenosis. RIGHT VERTEBRAL ARTERY:  Antegrade flow LEFT CAROTID ARTERY: There is a minimal amount of echogenic atherosclerotic plaque within the left carotid bulb (image 48), extending to involve the origin and proximal aspects of the left internal carotid artery (image 56), not resulting in elevated peak systolic velocities within the interrogated  course the left internal carotid artery to suggest a hemodynamically significant stenosis. LEFT VERTEBRAL ARTERY:  Antegrade flow IMPRESSION: Minimal amount of bilateral atherosclerotic plaque, left subjectively greater than right, not resulting in a hemodynamically significant stenosis within either internal carotid artery. Electronically Signed   By: Simonne ComeJohn  Watts M.D.   On: 09/09/2017 08:28   Dg Chest Portable 1 View  Result Date: 09/08/2017 CLINICAL DATA:  83 year old female with left lower extremity weakness. EXAM: PORTABLE CHEST 1 VIEW COMPARISON:  Chest CT dated 09/17/2016 FINDINGS: Shallow inspiration with minimal bibasilar atelectasis. No focal consolidation, pleural effusion, or pneumothorax. There is atherosclerotic calcification of the aortic arch. Left pectoral dual lead pacemaker device. Osteopenia with degenerative changes of the spine. No acute osseous pathology. IMPRESSION: No active disease. Electronically Signed   By: Elgie CollardArash  Radparvar M.D.   On: 09/08/2017 22:12    Scheduled Meds: . amLODipine  5 mg Oral Daily  . brinzolamide  1 drop Both Eyes BID  . dipyridamole-aspirin  1 capsule Oral BID  . gabapentin  100 mg Oral QHS  . heparin  5,000 Units Subcutaneous Q8H  . ipratropium-albuterol  3 mL  Nebulization Q6H  . latanoprost  1 drop Both Eyes QHS  . lisinopril  40 mg Oral Daily  . metoprolol succinate  50 mg Oral Daily  . pantoprazole  40 mg Oral Daily  . PARoxetine  20 mg Oral Daily  . simvastatin  20 mg Oral QHS   Continuous Infusions:  Assessment/Plan:  1. Left-sided weakness.  Suspected stroke.  History of prior stroke in the past repeat  CAT scan of the head tomorrow.  Echocardiogram still pending.  Carotid ultrasound minimal plaque.  As per neurology, change Aggrenox to Plavix and continue with aspirin.  Change simvastatin over to atorvastatin.  Awaiting PT and OT consultations. 2. Hypotension.  Hold lisinopril, metoprolol and amlodipine.  Start IV fluid hydration.   With suspected stroke would like higher blood pressure at this point. 3. Hyperlipidemia unspecified change simvastatin over to atorvastatin 4. Depression on Paxil 5. GERD on Protonix 6. History of dementia 7. Glaucoma unspecified continue eyedrops  Code Status:     Code Status Orders  (From admission, onward)        Start     Ordered   09/09/17 0147  Full code  Continuous     09/09/17 0146    Code Status History    Date Active Date Inactive Code Status Order ID Comments User Context   09/17/2016 15:57 09/18/2016 17:28 Full Code [id number]  Katha HammingKonidena, Snehalatha, MD ED    Advance Directive Documentation     Most Recent Value  Type of Advance Directive  Healthcare Power of Attorney, Living will  Pre-existing out of facility DNR order (yellow form or pink MOST form)  No data  "MOST" Form in Place?  No data     Family Communication: Spoke with sisters at the bedside Disposition Plan: To be determined  Consultants:  Neurology  Time spent: 35 minutes  Lonald Troiani [employer]  [employer]

## 2017-09-10 ENCOUNTER — Observation Stay: Payer: Medicare Other

## 2017-09-10 MED ORDER — IPRATROPIUM-ALBUTEROL 0.5-2.5 (3) MG/3ML IN SOLN
3.0000 mL | Freq: Two times a day (BID) | RESPIRATORY_TRACT | Status: DC
  Administered 2017-09-10 – 2017-09-11 (×2): 3 mL via RESPIRATORY_TRACT
  Filled 2017-09-10 (×2): qty 3

## 2017-09-10 NOTE — Clinical Social Work Note (Signed)
CSW attempted to contact the patient's daughter after 3 pm as requested with no answer. The CSW left a HIPPA compliant voice mail. According to the PT note, the patient's daughter is declining STR at this time. CSW will continue to follow pending change in decision.  Jessica PonderKaren Martha [name], MSW, Theresia MajorsLCSWA [phone]

## 2017-09-10 NOTE — Progress Notes (Signed)
OT Cancellation Note  Patient Details Name: Jessica Gardner MRN: [id number] DOB: 1930/03/13   Cancelled Treatment:     Attempted to see patient this am however off the floor for testing, will continue attempts.  Chaniah Cisse T Dalma Panchal, OTR/L, CLT   Jessica Gardner 09/10/2017, 10:48 AM

## 2017-09-10 NOTE — Progress Notes (Signed)
Physical Therapy Treatment Patient Details Name: Jessica Gardner MRN: [id number] DOB: 04-28-1930 Today's Date: 09/10/2017    History of Present Illness 83 yo Female came to ED with left LE weakness; Patient has PMH significant for CVA 4-5 years ago with left sided weakness; However she reports that her left leg has progressively gotten worse; She is not safe for MRI due to pacemaker; recent CT scan was negative for acute abnormality; However MD reports that she likely has another infarct due to small vessel disease;     PT Comments    Pt awake and ready for session.  Daughter in room.  Participated in supine exercises as described below.  To edge of bed with min assist.  Once sitting able to maintain balance with min guard.  She stood x 1 at bedside.  She was encouraged but had difficulty stepping in place.  After a few attempts, she returned to sitting.  On second trial, she was able to stand and transfer to recliner at bedside with mod a x 1.  Sit to stand x 5 from recliner with min/mod a x 1.  Standing marching in place x 5 and SLR x 5 with mod assist to keep balance and provide support.  Pt with increased mobility today but remains with significant limitations in her mobility skills.  Daughter in for session and while meaning well, was resistant to education to increase overall safety and ways to make care for her at home easier and safer for pt.  Pt was encouraged to practice proper hand placements for transfer but daughter stated frequently "That's not how we do it at home" and encouraged pt to pull up on walker.  Stated she uses a rollator walker at home.  Attempted to explain pro/cons regarding rolling walker vs rollator and encouraged use of regular rolling walker upon discharge she stated they will continue to use rollator.   Daughter stated they have all equipment at home including both walkers and a wheelchair.  Stated she would not go to rehab but would return home with family.   Encouraged use of wheelchair for household mobility as she is unsafe/able to ambulate at this time with walker and she voiced understanding.  Will benefit from home health PT/OT  if she continues to decline rehab.  Will continue to recommend rehab at this time.    Follow Up Recommendations  SNF     Equipment Recommendations       Recommendations for Other Services       Precautions / Restrictions Precautions Precautions: Fall Restrictions Weight Bearing Restrictions: No    Mobility  Bed Mobility Overal bed mobility: Needs Assistance Bed Mobility: Rolling;Supine to Sit Rolling: Min assist   Supine to sit: Min assist     General bed mobility comments: with railings and elevated head of bed;   Transfers Overall transfer level: Needs assistance Equipment used: Rolling walker (2 wheeled) Transfers: Sit to/from Stand Sit to Stand: Min assist;Mod assist         General transfer comment: with cues for forward weight shift and to increase push through LE;   Ambulation/Gait Ambulation/Gait assistance: Mod assist     Gait Pattern/deviations: Step-to pattern;Decreased step length - right;Decreased step length - left;Narrow base of support Gait velocity: decreased Gait velocity interpretation: <1.8 ft/sec, indicative of risk for recurrent falls General Gait Details: took a few steps to bedside chair requiring mod A for safety with short shuffled steps and unsteadiness;    Stairs  Wheelchair Mobility    Modified Rankin (Stroke Patients Only)       Balance Overall balance assessment: Needs assistance Sitting-balance support: Bilateral upper extremity supported;Feet supported Sitting balance-Leahy Scale: Fair     Standing balance support: Bilateral upper extremity supported Standing balance-Leahy Scale: Poor                              Cognition Arousal/Alertness: Awake/alert Behavior During Therapy: WFL for tasks  assessed/performed Overall Cognitive Status: Within Functional Limits for tasks assessed                                        Exercises Other Exercises Other Exercises: BLE ankle pumps, heel slides and SLR x 10    General Comments        Pertinent Vitals/Pain Pain Assessment: No/denies pain    Home Living                      Prior Function            PT Goals (current goals can now be found in the care plan section) Progress towards PT goals: Progressing toward goals    Frequency           PT Plan Current plan remains appropriate    Co-evaluation              AM-PAC PT "6 Clicks" Daily Activity  Outcome Measure  Difficulty turning over in bed (including adjusting bedclothes, sheets and blankets)?: Unable   Difficulty sitting down on and standing up from a chair with arms (e.g., wheelchair, bedside commode, etc,.)?: Unable Help needed moving to and from a bed to chair (including a wheelchair)?: A Lot Help needed walking in hospital room?: A Lot Help needed climbing 3-5 steps with a railing? : Total 6 Click Score: 2    End of Session Equipment Utilized During Treatment: Gait belt Activity Tolerance: Patient tolerated treatment well;No increased pain Patient left: in chair;with chair alarm set;with call bell/phone within reach;with family/visitor present;with nursing/sitter in room         Time: 0911-0926 PT Time Calculation (min) (ACUTE ONLY): 15 min  Charges:  $Therapeutic Activity: 8-22 mins                    G Codes:       Danielle DessSarah Hughey Rittenberry, PTA 09/10/17, 9:39 AM

## 2017-09-10 NOTE — Progress Notes (Signed)
Patient ID: Jessica Gardner, female   DOB: [birth year], 83 y.o.   MRN: [id number]  [facility] PROGRESS NOTE  Jessica Gardner WUJ:[id number] DOB: [birth year] DOA: 09/08/2017 PCP: Barbette Reichmann, MD  HPI/Subjective: Patient feeling better.  Offers no complaints.  She states she does with her family wants her to do.  The family member that will make the medical decision on whether she goes home with home health or to rehab is at work until 3 PM.  We are unable to contact until after work.  Objective: Vitals:   09/09/17 2005 09/10/17 0607  BP: (!) 109/49 120/60  Pulse: 96 91  Resp: 18 20  Temp: 98.3 F (36.8 C) 98.7 F (37.1 C)  SpO2: 95% 97%    Filed Weights   09/08/17 2107 09/09/17 0150  Weight: 59.9 kg (132 lb) 60.1 kg (132 lb 7 oz)    ROS: Review of Systems  Constitutional: Negative for chills and fever.  Eyes: Negative for blurred vision.  Respiratory: Negative for cough and shortness of breath.   Cardiovascular: Negative for chest pain.  Gastrointestinal: Negative for abdominal pain, constipation, diarrhea, nausea and vomiting.  Genitourinary: Negative for dysuria.  Musculoskeletal: Positive for joint pain.  Neurological: Negative for dizziness and headaches.   Exam: Physical Exam  Constitutional: She is oriented to person, place, and time.  HENT:  Nose: No mucosal edema.  Mouth/Throat: No oropharyngeal exudate or posterior oropharyngeal edema.  Eyes: Conjunctivae, EOM and lids are normal. Pupils are equal, round, and reactive to light.  Neck: No JVD present. Carotid bruit is not present. No edema present. No thyroid mass and no thyromegaly present.  Cardiovascular: S1 normal and S2 normal. Exam reveals no gallop.  No murmur heard. Pulses:      Dorsalis pedis pulses are 2+ on the right side, and 2+ on the left side.  Respiratory: No respiratory distress. She has no wheezes. She has no rhonchi. She has no rales.  GI: Soft. Bowel sounds are normal. There is  no tenderness.  Musculoskeletal:       Left shoulder: She exhibits decreased range of motion, tenderness and swelling.       Right ankle: She exhibits no swelling.       Left ankle: She exhibits no swelling.  Lymphadenopathy:    She has no cervical adenopathy.  Neurological: She is alert and oriented to person, place, and time. No cranial nerve deficit.  Left shoulder pain to palpation.  Likely frozen shoulder.  Patient sensation seems like it is back to light touch on the left leg.  Babinski positive left leg.  Power 4 out of 5 on the left upper and lower extremity.  5 out of 5 on the right side.  Skin: Skin is warm. No rash noted. Nails show no clubbing.  Psychiatric: She has a normal mood and affect.      Data Reviewed: Basic Metabolic Panel: Recent Labs  Lab 09/08/17 2100 09/09/17 0410  NA 139  --   K 3.6  --   CL 108  --   CO2 25  --   GLUCOSE 155*  --   BUN 18  --   CREATININE 1.34* 1.17*  CALCIUM 9.1  --    CBC: Recent Labs  Lab 09/08/17 2100 09/09/17 0410  WBC 6.3 5.4  HGB 10.7* 10.4*  HCT 32.1* 30.8*  MCV 92.5 93.2  PLT 352 320   Cardiac Enzymes: Recent Labs  Lab 09/08/17 2100  TROPONINI <0.03   BNP (  last 3 results) Recent Labs    09/17/16 1321  BNP 41.0      Studies: Ct Head Wo Contrast  Result Date: 09/10/2017 CLINICAL DATA:  Left-sided weakness. Focal neuro deficit for greater than 6 hours. EXAM: CT HEAD WITHOUT CONTRAST TECHNIQUE: Contiguous axial images were obtained from the base of the skull through the vertex without intravenous contrast. COMPARISON:  CT head without contrast 09/08/2017 and 03/05/2013. FINDINGS: Brain: Atrophy and advanced white matter disease similar the prior study. White matter changes extend into the brainstem. Cerebellar atrophy is noted. The insular ribbon is intact bilaterally. Basal ganglia are unchanged. No acute infarct, hemorrhage, or mass lesion is present. The ventricles are proportionate to the degree of  atrophy and stable. No significant extra-axial fluid collection is present. Vascular: Dense atherosclerotic calcifications are again seen within the cavernous internal carotid artery is. There is no hyperdense vessel. Skull: The calvarium is intact. No focal lytic or blastic lesions are present. Sinuses/Orbits: The paranasal sinuses and mastoid air cells are clear. Bilateral lens replacements are present. Globes and orbits are within normal limits. IMPRESSION: 1. Stable advanced atrophy and white matter disease. This likely reflects the sequela of chronic microvascular ischemia. 2. No acute intracranial abnormality. 3. Atherosclerosis. Electronically Signed   By: Marin Robertshristopher  Mattern M.D.   On: 09/10/2017 10:58   Ct Head Wo Contrast  Result Date: 09/08/2017 CLINICAL DATA:  83 year old female with focal neurological deficit. EXAM: CT HEAD WITHOUT CONTRAST TECHNIQUE: Contiguous axial images were obtained from the base of the skull through the vertex without intravenous contrast. COMPARISON:  Head CT dated 03/05/2013 FINDINGS: Brain: There is moderate age-related atrophy and chronic microvascular ischemic changes. There is no acute intracranial hemorrhage. No mass effect or midline shift. No extra-axial fluid collection. Vascular: No hyperdense vessel or unexpected calcification. Skull: Normal. Negative for fracture or focal lesion. Sinuses/Orbits: No acute finding. Other: None IMPRESSION: 1. No acute intracranial hemorrhage. 2. Moderate age-related atrophy and chronic microvascular ischemic changes. If symptoms persist, and there are no contraindications, MRI may provide better evaluation if clinically indicated. Electronically Signed   By: Elgie CollardArash  Radparvar M.D.   On: 09/08/2017 21:40   [place] Carotid Bilateral (at [facility] And [facility] Only)  Result Date: 09/09/2017 CLINICAL DATA:  Left leg weakness. History of stroke, syncopal episode, hypertension, CAD, hyperlipidemia and diabetes. EXAM: BILATERAL CAROTID DUPLEX  ULTRASOUND TECHNIQUE: Wallace CullensGray scale imaging, color Doppler and duplex ultrasound were performed of bilateral carotid and vertebral arteries in the neck. COMPARISON:  None. FINDINGS: Criteria: Quantification of carotid stenosis is based on velocity parameters that correlate the residual internal carotid diameter with NASCET-based stenosis levels, using the diameter of the distal internal carotid lumen as the denominator for stenosis measurement. The following velocity measurements were obtained: RIGHT ICA:  84/27 cm/sec CCA:  70/14 cm/sec SYSTOLIC ICA/CCA RATIO:  1.2 DIASTOLIC ICA/CCA RATIO:  2.0 ECA:  167 cm/sec LEFT ICA:  75/9 cm/sec CCA:  96/13 cm/sec SYSTOLIC ICA/CCA RATIO:  0.8 DIASTOLIC ICA/CCA RATIO:  1.4 ECA:  93 cm/sec RIGHT CAROTID ARTERY: There is a minimal amount of echogenic atherosclerotic plaque within the right carotid bulb (image 16), extending to involve the origin and proximal aspects of the right internal carotid artery (image 23), not resulting in elevated peak systolic velocities within the interrogated course the right internal carotid artery to suggest a hemodynamically significant stenosis. RIGHT VERTEBRAL ARTERY:  Antegrade flow LEFT CAROTID ARTERY: There is a minimal amount of echogenic atherosclerotic plaque within the left carotid bulb (image 48), extending to  involve the origin and proximal aspects of the left internal carotid artery (image 56), not resulting in elevated peak systolic velocities within the interrogated course the left internal carotid artery to suggest a hemodynamically significant stenosis. LEFT VERTEBRAL ARTERY:  Antegrade flow IMPRESSION: Minimal amount of bilateral atherosclerotic plaque, left subjectively greater than right, not resulting in a hemodynamically significant stenosis within either internal carotid artery. Electronically Signed   By: Simonne ComeJohn  Watts M.D.   On: 09/09/2017 08:28   Dg Chest Portable 1 View  Result Date: 09/08/2017 CLINICAL DATA:  83 year old  female with left lower extremity weakness. EXAM: PORTABLE CHEST 1 VIEW COMPARISON:  Chest CT dated 09/17/2016 FINDINGS: Shallow inspiration with minimal bibasilar atelectasis. No focal consolidation, pleural effusion, or pneumothorax. There is atherosclerotic calcification of the aortic arch. Left pectoral dual lead pacemaker device. Osteopenia with degenerative changes of the spine. No acute osseous pathology. IMPRESSION: No active disease. Electronically Signed   By: Elgie CollardArash  Radparvar M.D.   On: 09/08/2017 22:12    Scheduled Meds: . aspirin EC  81 mg Oral Daily  . atorvastatin  40 mg Oral q1800  . brinzolamide  1 drop Both Eyes BID  . clopidogrel  75 mg Oral Daily  . gabapentin  100 mg Oral QHS  . heparin  5,000 Units Subcutaneous Q8H  . ipratropium-albuterol  3 mL Nebulization BID  . latanoprost  1 drop Both Eyes QHS  . pantoprazole  40 mg Oral Daily  . PARoxetine  20 mg Oral Daily   Continuous Infusions:  Assessment/Plan:  1. Left-sided weakness.  Suspected stroke.  Repeat CT scan negative for stroke..  Echocardiogram showed no source of cardiac emboli.  Carotid ultrasound minimal plaque.  Patient on aspirin, Plavix and atorvastatin.  Physical therapy yesterday recommended nursing home versus home with home health with 24-hour supervision.  Today's physical therapist recommended rehab.  Daughter that will make medical decision is not available at this point.  Potentially can go home with home health tomorrow versus rehab on Monday. 2. Hypotension.  Hold lisinopril, metoprolol and amlodipine.  With suspected stroke would like higher blood pressure at this point. 3. Hyperlipidemia unspecified changed simvastatin over to atorvastatin 4. Depression on Paxil 5. GERD on Protonix 6. History of dementia 7. Glaucoma unspecified continue eyedrops  Code Status:     Code Status Orders  (From admission, onward)        Start     Ordered   09/09/17 0147  Full code  Continuous     09/09/17  0146    Code Status History    Date Active Date Inactive Code Status Order ID Comments User Context   09/17/2016 15:57 09/18/2016 17:28 Full Code [id number]  Katha HammingKonidena, Snehalatha, MD ED    Advance Directive Documentation     Most Recent Value  Type of Advance Directive  Healthcare Power of Attorney, Living will  Pre-existing out of facility DNR order (yellow form or pink MOST form)  No data  "MOST" Form in Place?  No data     Family Communication: Spoke with one sister at the bedside Disposition Plan: Home with home health versus rehab  Consultants:  Neurology  Time spent: 28 minutes  Gracynn Rajewski [employer]  [employer] [facility]

## 2017-09-10 NOTE — Evaluation (Signed)
Occupational Therapy Evaluation Patient Details Name: Jessica Gardner MRN: [id number] DOB: 1930/09/03 Today's Date: 09/10/2017    History of Present Illness 83 yo Female came to ED with left LE weakness; Patient has PMH significant for CVA 4-5 years ago with left sided weakness; However she reports that her left leg has progressively gotten worse; She is not safe for MRI due to pacemaker; recent CT scan was negative for acute abnormality; However MD reports that she likely has another infarct due to small vessel disease;    Clinical Impression   Patient seen for OT evaluation this date as follows:  Patient lives with her son in a one story home with 4 steps to enter.  She has a supportive family and also has an aide who comes in 3 days a week to assist with self care tasks. She had a fall about 6 months ago which resulted in hurting her left shoulder and had residual pain and decreased motion.  She was previously able to ambulate with a rollator at home, could toilet with modified independence and could assist with basic self care tasks.  She requires assist for all homemaking skills from family.  She has a history of prior CVAs.  She presents with muscle weakness in LUE and LLE, limited ROM, decreased coordination, transfers, functional mobility and decreased ability to help with self care tasks.  She is requiring min to moderate assist with transfers and impaired balance. Patient would benefit from skilled OT to maximize her safety and independence in daily tasks and would likely benefit from short term rehab prior to returning home.     Follow Up Recommendations  SNF    Equipment Recommendations  3 in 1 bedside commode    Recommendations for Other Services       Precautions / Restrictions Precautions Precautions: Fall Restrictions Weight Bearing Restrictions: No      Mobility Bed Mobility Overal bed mobility: Needs Assistance Bed Mobility: Rolling;Supine to Sit Rolling: Min  assist   Supine to sit: Min assist     General bed mobility comments: with railings and elevated head of bed;   Transfers Overall transfer level: Needs assistance Equipment used: Rolling walker (2 wheeled) Transfers: Sit to/from Stand Sit to Stand: Min assist;Mod assist              Balance Overall balance assessment: Needs assistance Sitting-balance support: Bilateral upper extremity supported;Feet supported Sitting balance-Leahy Scale: Fair     Standing balance support: Bilateral upper extremity supported Standing balance-Leahy Scale: Poor                             ADL either performed or assessed with clinical judgement   ADL Overall ADL's : Needs assistance/impaired Eating/Feeding: Set up;Minimal assistance   Grooming: Set up;Minimal assistance   Upper Body Bathing: Bed level;Minimal assistance   Lower Body Bathing: Bed level;Moderate assistance   Upper Body Dressing : Minimal assistance   Lower Body Dressing: Moderate assistance   Toilet Transfer: Moderate assistance   Toileting- Clothing Manipulation and Hygiene: Moderate assistance         General ADL Comments: Patient was able to transfer towards her good side and use of side rails with min assist, min to mod assist to stand.  She normally uses a rollator at home.      Vision         Perception     Praxis      Pertinent  Vitals/Pain Pain Assessment: 0-10 Pain Score: 7  Pain Location: left knee Pain Descriptors / Indicators: Aching Pain Intervention(s): Limited activity within patient's tolerance;Monitored during session;Repositioned     Hand Dominance Right   Extremity/Trunk Assessment Upper Extremity Assessment Upper Extremity Assessment: LUE deficits/detail LUE Deficits / Details: Patient reports prior injury to her left shoulder after a fall 6 months ago.  She is limited with shoulder movement to 90 degrees of flexion, ABD.  Decreased grip on left, decreased  coordination skills. Sensation appears intact.  LUE Coordination: decreased fine motor;decreased gross motor   Lower Extremity Assessment Lower Extremity Assessment: Generalized weakness;Defer to PT evaluation   Cervical / Trunk Assessment Cervical / Trunk Assessment: Kyphotic   Communication Communication Communication: No difficulties   Cognition Arousal/Alertness: Awake/alert Behavior During Therapy: WFL for tasks assessed/performed Overall Cognitive Status: Within Functional Limits for tasks assessed                                 General Comments: increased time for processing of information   General Comments       Exercises     Shoulder Instructions      Home Living Family/patient expects to be discharged to:: Private residence Living Arrangements: Children Available Help at Discharge: Family;Available 24 hours/day Type of Home: House Home Access: Stairs to enter [employer] of Steps: 4 Entrance Stairs-Rails: Right;Left Home Layout: One level     Bathroom Shower/Tub: IT trainer: Standard Bathroom Accessibility: Yes   Home Equipment: Environmental consultant - 4 wheels;Wheelchair - manual          Prior Functioning/Environment Level of Independence: Needs assistance  Gait / Transfers Assistance Needed: used Rollator when walking in the house; would use wheelchair when leaving home for doctor appointments etc;  ADL's / Homemaking Assistance Needed: son helped with homemaking; patient has home aide that comes every other day to assist with bathing/dressing and meal prep            OT Problem List: Decreased strength;Decreased activity tolerance;Decreased knowledge of use of DME or AE;Impaired UE functional use;Decreased range of motion;Impaired balance (sitting and/or standing);Decreased coordination;Pain      OT Treatment/Interventions: Self-care/ADL training;DME and/or AE instruction;Therapeutic  activities;Balance training;Therapeutic exercise;Manual therapy;Neuromuscular education;Patient/family education    OT Goals(Current goals can be found in the care plan section) Acute Rehab OT Goals Patient Stated Goal: Patient reports she would like to be stronger and do as much as she can OT Goal Formulation: With patient/family Time For Goal Achievement: 09/24/17 Potential to Achieve Goals: Good ADL Goals Pt Will Perform Upper Body Dressing: (P) with modified independence Pt Will Perform Lower Body Dressing: (P) with min assist Pt Will Transfer to Toilet: (P) with min assist  OT Frequency: Min 1X/week   Barriers to D/C:            Co-evaluation              AM-PAC PT "6 Clicks" Daily Activity     Outcome Measure Help from another person eating meals?: A Little Help from another person taking care of personal grooming?: A Little Help from another person toileting, which includes using toliet, bedpan, or urinal?: A Lot Help from another person bathing (including washing, rinsing, drying)?: A Lot Help from another person to put on and taking off regular upper body clothing?: A Little Help from another person to put on and taking off regular lower body clothing?:  A Lot 6 Click Score: 15   End of Session Equipment Utilized During Treatment: Gait belt;Rolling walker  Activity Tolerance: Patient tolerated treatment well Patient left: in bed;with call bell/phone within reach;with bed alarm set;with nursing/sitter in room;with family/visitor present  OT Visit Diagnosis: Unsteadiness on feet (R26.81);Muscle weakness (generalized) (M62.81);History of falling (Z91.81);Hemiplegia and hemiparesis;Pain Hemiplegia - Right/Left: Left Hemiplegia - dominant/non-dominant: Non-Dominant Pain - Right/Left: Left Pain - part of body: Knee                Time: 1500-1527 OT Time Calculation (min): 27 min Charges:  OT General Charges $OT Visit: 1 Visit OT Evaluation $OT Eval Low Complexity:  1 Low OT Treatments $Self Care/Home Management : 8-22 mins G-Codes: OT G-codes **NOT FOR INPATIENT CLASS** Functional Assessment Tool Used: AM-PAC 6 Clicks Daily Activity Functional Limitation: Self care Self Care Current Status (Z3086(G8987): At least 40 percent but less than 60 percent impaired, limited or restricted Self Care Goal Status (V7846(G8988): At least 20 percent but less than 40 percent impaired, limited or restricted  Amy T Lovett, OTR/L, CLT   Lovett,Amy 09/10/2017, 3:40 PM

## 2017-09-10 NOTE — Plan of Care (Signed)
  Safety: Ability to remain free from injury will improve 09/10/2017 1416 - Progressing by Garwin Brothershomas, Micheale Schlack Lynn, RN  Pt remains on High Fall Risks; PT worked with pt earlier; pt oob to chair; weak on left side

## 2017-09-11 LAB — BASIC METABOLIC PANEL
Anion gap: 8 (ref 5–15)
BUN: 13 mg/dL (ref 6–20)
CHLORIDE: 107 mmol/L (ref 101–111)
CO2: 26 mmol/L (ref 22–32)
CREATININE: 0.94 mg/dL (ref 0.44–1.00)
Calcium: 9.2 mg/dL (ref 8.9–10.3)
GFR calc Af Amer: >60 mL/min (ref >60)
GFR calc non Af Amer: 54 mL/min — ABNORMAL LOW (ref >60)
GLUCOSE: 148 mg/dL — AB (ref 65–99)
POTASSIUM: 3.9 mmol/L (ref 3.5–5.1)
SODIUM: 141 mmol/L (ref 135–145)

## 2017-09-11 LAB — CBC
HEMATOCRIT: 30.3 % — AB (ref 35.0–47.0)
HEMOGLOBIN: 10.3 g/dL — AB (ref 12.0–16.0)
MCH: 31.4 pg (ref 26.0–34.0)
MCHC: 33.9 g/dL (ref 32.0–36.0)
MCV: 92.7 fL (ref 80.0–100.0)
Platelets: 316 10*3/uL (ref 150–440)
RBC: 3.27 MIL/uL — AB (ref 3.80–5.20)
RDW: 12.4 % (ref 11.5–14.5)
WBC: 5.1 10*3/uL (ref 3.6–11.0)

## 2017-09-11 MED ORDER — POLYETHYLENE GLYCOL 3350 17 G PO PACK
17.0000 g | PACK | Freq: Every day | ORAL | Status: DC
  Administered 2017-09-11: 17 g via ORAL
  Filled 2017-09-11 (×2): qty 1

## 2017-09-11 MED ORDER — IPRATROPIUM-ALBUTEROL 0.5-2.5 (3) MG/3ML IN SOLN: 3.0000 mL | Freq: Four times a day (QID) | RESPIRATORY_TRACT | Status: DC | PRN

## 2017-09-11 MED ORDER — BISACODYL 10 MG RE SUPP
10.0000 mg | RECTAL | Status: AC
  Administered 2017-09-11: 10 mg via RECTAL
  Filled 2017-09-11: qty 1

## 2017-09-11 NOTE — Care Management Note (Signed)
Case Management Note  Patient Details  Name: Sheppard Plumberrlania L Parslow MRN: [id number] Date of Birth: 09-25-1929  Subjective/Objective:    Discussed discharge planning with daughter Marylene Landngela who reports that she would like for Mrs Elza RafterJeffers to go to Rehab for 3 days and then be discharged from SNF with home health services. [facility]-PT and OT are recommending SNF.    Action/Plan:   Expected Discharge Date:                  Expected Discharge Plan:     In-House Referral:     Discharge planning Services     Post Acute Care Choice:    Choice offered to:     DME Arranged:    DME Agency:     HH Arranged:    HH Agency:     Status of Service:     If discussed at [employer] Length of Stay Meetings, dates discussed:    Additional Comments:  Sylvia Kondracki A, RN 09/11/2017, 10:33 AM

## 2017-09-11 NOTE — Progress Notes (Signed)
Patient ID: Jessica PlumberArlania L Perkinson, female   DOB: 1930-04-25, 83 y.o.   MRN: [id number]  [facility] PROGRESS NOTE  Jessica Gardner WUJ:[id number] DOB: 1930-04-25 DOA: 09/08/2017 PCP: Barbette ReichmannHande, Vishwanath, MD  HPI/Subjective: Patient feeling okay.  Still with weakness on the left side.  Left shoulder is actually doing a little bit better.  Objective: Vitals:   09/11/17 0131 09/11/17 0507  BP: (!) 106/58 114/64  Pulse: 98 96  Resp: 18 18  Temp: 98.2 F (36.8 C) 98.4 F (36.9 C)  SpO2: 95% 96%    Filed Weights   09/08/17 2107 09/09/17 0150  Weight: 59.9 kg (132 lb) 60.1 kg (132 lb 7 oz)    ROS: Review of Systems  Constitutional: Negative for chills and fever.  Eyes: Negative for blurred vision.  Respiratory: Negative for cough and shortness of breath.   Cardiovascular: Negative for chest pain.  Gastrointestinal: Positive for constipation. Negative for abdominal pain, diarrhea, nausea and vomiting.  Genitourinary: Negative for dysuria.  Musculoskeletal: Positive for joint pain.  Neurological: Negative for dizziness and headaches.   Exam: Physical Exam  Constitutional: She is oriented to person, place, and time.  HENT:  Nose: No mucosal edema.  Mouth/Throat: No oropharyngeal exudate or posterior oropharyngeal edema.  Eyes: Conjunctivae, EOM and lids are normal. Pupils are equal, round, and reactive to light.  Neck: No JVD present. Carotid bruit is not present. No edema present. No thyroid mass and no thyromegaly present.  Cardiovascular: S1 normal and S2 normal. Exam reveals no gallop.  No murmur heard. Pulses:      Dorsalis pedis pulses are 2+ on the right side, and 2+ on the left side.  Respiratory: No respiratory distress. She has no wheezes. She has no rhonchi. She has no rales.  GI: Soft. Bowel sounds are normal. There is no tenderness.  Musculoskeletal:       Left shoulder: She exhibits decreased range of motion, tenderness and swelling.       Right ankle: She  exhibits no swelling.       Left ankle: She exhibits no swelling.  Lymphadenopathy:    She has no cervical adenopathy.  Neurological: She is alert and oriented to person, place, and time. No cranial nerve deficit.  Left shoulder pain to palpation.  Patient able to move left shoulder better today still limited with movement up overhead.  Sensation intact.  Babinski positive left leg.  Power 4 out of 5 on the left upper and lower extremity.  5 out of 5 on the right side.  Skin: Skin is warm. No rash noted. Nails show no clubbing.  Psychiatric: She has a normal mood and affect.      Data Reviewed: Basic Metabolic Panel: Recent Labs  Lab 09/08/17 2100 09/09/17 0410 09/11/17 0533  NA 139  --  141  K 3.6  --  3.9  CL 108  --  107  CO2 25  --  26  GLUCOSE 155*  --  148*  BUN 18  --  13  CREATININE 1.34* 1.17* 0.94  CALCIUM 9.1  --  9.2   CBC: Recent Labs  Lab 09/08/17 2100 09/09/17 0410 09/11/17 0533  WBC 6.3 5.4 5.1  HGB 10.7* 10.4* 10.3*  HCT 32.1* 30.8* 30.3*  MCV 92.5 93.2 92.7  PLT 352 320 316   Cardiac Enzymes: Recent Labs  Lab 09/08/17 2100  TROPONINI <0.03   BNP (last 3 results) Recent Labs    09/17/16 1321  BNP 41.0  Studies: Ct Head Wo Contrast  Result Date: 09/10/2017 CLINICAL DATA:  Left-sided weakness. Focal neuro deficit for greater than 6 hours. EXAM: CT HEAD WITHOUT CONTRAST TECHNIQUE: Contiguous axial images were obtained from the base of the skull through the vertex without intravenous contrast. COMPARISON:  CT head without contrast 09/08/2017 and 03/05/2013. FINDINGS: Brain: Atrophy and advanced white matter disease similar the prior study. White matter changes extend into the brainstem. Cerebellar atrophy is noted. The insular ribbon is intact bilaterally. Basal ganglia are unchanged. No acute infarct, hemorrhage, or mass lesion is present. The ventricles are proportionate to the degree of atrophy and stable. No significant extra-axial fluid  collection is present. Vascular: Dense atherosclerotic calcifications are again seen within the cavernous internal carotid artery is. There is no hyperdense vessel. Skull: The calvarium is intact. No focal lytic or blastic lesions are present. Sinuses/Orbits: The paranasal sinuses and mastoid air cells are clear. Bilateral lens replacements are present. Globes and orbits are within normal limits. IMPRESSION: 1. Stable advanced atrophy and white matter disease. This likely reflects the sequela of chronic microvascular ischemia. 2. No acute intracranial abnormality. 3. Atherosclerosis. Electronically Signed   By: Marin Robertshristopher  Mattern M.D.   On: 09/10/2017 10:58    Scheduled Meds: . aspirin EC  81 mg Oral Daily  . atorvastatin  40 mg Oral q1800  . brinzolamide  1 drop Both Eyes BID  . clopidogrel  75 mg Oral Daily  . gabapentin  100 mg Oral QHS  . heparin  5,000 Units Subcutaneous Q8H  . ipratropium-albuterol  3 mL Nebulization BID  . latanoprost  1 drop Both Eyes QHS  . pantoprazole  40 mg Oral Daily  . PARoxetine  20 mg Oral Daily  . polyethylene glycol  17 g Oral Daily   Continuous Infusions:  Assessment/Plan:  1. Left-sided weakness.  Suspected stroke.  Repeat CT scan negative for stroke..  Echocardiogram showed no source of cardiac emboli.  Carotid ultrasound minimal plaque.  Patient on aspirin, Plavix and atorvastatin.  Family interested in short-term rehab.  Awaiting pass R. 2. Hypotension.  Hold lisinopril, metoprolol and amlodipine.  With suspected stroke would like higher blood pressure at this point. 3. Hyperlipidemia unspecified changed simvastatin over to atorvastatin 4. Depression on Paxil 5. GERD on Protonix 6. History of dementia 7. Glaucoma unspecified continue eyedrops 8. Constipation.  Patient had a bowel movement after suppository  Code Status:     Code Status Orders  (From admission, onward)        Start     Ordered   09/09/17 0147  Full code  Continuous      09/09/17 0146    Code Status History    Date Active Date Inactive Code Status Order ID Comments User Context   09/17/2016 15:57 09/18/2016 17:28 Full Code [id number]  Katha HammingKonidena, Snehalatha, MD ED    Advance Directive Documentation     Most Recent Value  Type of Advance Directive  Healthcare Power of Attorney, Living will  Pre-existing out of facility DNR order (yellow form or pink MOST form)  No data  "MOST" Form in Place?  No data     Family Communication: Spoke with sisters at bedside Disposition Plan: Short-term rehab hopefully Monday if passar received  Consultants:  Neurology  Time spent: 26 minutes  Xiana Carns [employer]  [facility]

## 2017-09-11 NOTE — NC FL2 (Signed)
[place] MEDICAID FL2 LEVEL OF CARE SCREENING TOOL     IDENTIFICATION  Patient Name: Jessica Gardner Birthdate: 1930-01-02 Sex: female Admission Date (Current Location): 09/08/2017  [facility]   Facility and Address:  [facility], [address], [place], [place] [address]      Provider Number: [id number]  Attending Physician Name and Address:  Alford HighlandWieting, Richard, MD  Relative Name and Phone Number:  Alvy BimlerJeffers,Angela Daughter [phone]     Current Level of Care: Hospital Recommended Level of Care: Skilled Nursing Facility Prior Approval Number:    Date Approved/Denied:   PASRR Number:    Discharge Plan: SNF    Current Diagnoses: Patient Active Problem List   Diagnosis Date Noted  . Left leg weakness 09/09/2017  . Diabetes (HCC) 09/09/2017  . Dementia 09/09/2017  . History of stroke 09/09/2017  . Acute bronchitis 09/18/2016  . Elevated troponin 09/18/2016  . Anemia 09/18/2016  . Renal insufficiency 09/18/2016  . Generalized weakness 09/18/2016  . Chest pain 09/17/2016    Orientation RESPIRATION BLADDER Height & Weight     Self, Time, Situation, Place  O2(2L o2) Continent Weight: 132 lb 7 oz (60.1 kg) Height:  5\' 1"  (154.9 cm)  BEHAVIORAL SYMPTOMS/MOOD NEUROLOGICAL BOWEL NUTRITION STATUS      Continent Diet(Heart Healthy/Carb Modified)  AMBULATORY STATUS COMMUNICATION OF NEEDS Skin   Extensive Assist Verbally Normal                       Personal Care Assistance Level of Assistance  Bathing, Feeding, Dressing Bathing Assistance: Limited assistance Feeding assistance: Independent Dressing Assistance: Limited assistance     Functional Limitations Info             SPECIAL CARE FACTORS FREQUENCY  PT (By licensed PT), OT (By licensed OT)     PT Frequency: By licencsed PT OT Frequency: By licensed OT            Contractures Contractures Info: Not present    Additional Factors Info   Code Status, Allergies, Psychotropic Code Status Info: Full Allergies Info: No Known Allergies Psychotropic Info: Xanax, Neurontin, Paxil         Current Medications (09/11/2017):  This is the current hospital active medication list Current Facility-Administered Medications  Medication Dose Route Frequency Provider Last Rate Last Dose  . acetaminophen (TYLENOL) tablet 650 mg  650 mg Oral Q4H PRN Oralia ManisWillis, David, MD       Or  . acetaminophen (TYLENOL) solution 650 mg  650 mg Per Tube Q4H PRN Oralia ManisWillis, David, MD       Or  . acetaminophen (TYLENOL) suppository 650 mg  650 mg Rectal Q4H PRN Oralia ManisWillis, David, MD      . ALPRAZolam Prudy Feeler(XANAX) tablet 0.25 mg  0.25 mg Oral QHS PRN Oralia ManisWillis, David, MD      . aspirin EC tablet 81 mg  81 mg Oral Daily Alford HighlandWieting, Richard, MD   81 mg at 09/11/17 1005  . atorvastatin (LIPITOR) tablet 40 mg  40 mg Oral q1800 Alford HighlandWieting, Richard, MD   40 mg at 09/10/17 1731  . brinzolamide (AZOPT) 1 % ophthalmic suspension 1 drop  1 drop Both Eyes BID Oralia ManisWillis, David, MD   1 drop at 09/11/17 1005  . clopidogrel (PLAVIX) tablet 75 mg  75 mg Oral Daily Alford HighlandWieting, Richard, MD   75 mg at 09/11/17 1005  . gabapentin (NEURONTIN) capsule 100 mg  100 mg Oral QHS Oralia ManisWillis, David, MD  100 mg at 09/10/17 2256  . heparin injection 5,000 Units  5,000 Units Subcutaneous Willow OraQ8H Willis, David, MD   5,000 Units at 09/11/17 [id number]  . ipratropium-albuterol (DUONEB) 0.5-2.5 (3) MG/3ML nebulizer solution 3 mL  3 mL Nebulization BID Alford HighlandWieting, Richard, MD   3 mL at 09/11/17 0801  . latanoprost (XALATAN) 0.005 % ophthalmic solution 1 drop  1 drop Both Eyes QHS Oralia ManisWillis, David, MD   1 drop at 09/10/17 2255  . pantoprazole (PROTONIX) EC tablet 40 mg  40 mg Oral Daily Oralia ManisWillis, David, MD   40 mg at 09/11/17 1005  . PARoxetine (PAXIL) tablet 20 mg  20 mg Oral Daily Oralia ManisWillis, David, MD   20 mg at 09/11/17 1005  . polyethylene glycol (MIRALAX / GLYCOLAX) packet 17 g  17 g Oral Daily Alford HighlandWieting, Richard, MD   17 g at 09/11/17 1005      Discharge Medications: Please see discharge summary for a list of discharge medications.  Relevant Imaging Results:  Relevant Lab Results:   Additional Information SS#[id number]  Judi CongKaren M Meriah Shands, LCSW

## 2017-09-11 NOTE — Plan of Care (Signed)
  Activity: Risk for activity intolerance will decrease 09/11/2017 1541 - Progressing by Garwin Brothershomas, Thijs Brunton Lynn, RN  Pt oob/br; br/chair via rolling walker and 2 person assist Elimination: Will not experience complications related to bowel motility 09/11/2017 1541 - Progressing by Garwin Brothershomas, Ladelle Teodoro Lynn, RN  Pt had scheduled Miralax and Bisacodyl this am; had large BM Safety: Ability to remain free from injury will improve 09/11/2017 1541 - Progressing by Garwin Brothershomas, Dorine Duffey Lynn, RN  Pt remains on [place] Risks this shift

## 2017-09-11 NOTE — Clinical Social Work Note (Signed)
Clinical Social Work Assessment  Patient Details  Name: Jessica Gardner MRN: [id number] Date of Birth: 02/05/1930  Date of referral:  09/11/17               Reason for consult:  Facility Placement                Permission sought to share information with:  Facility Contact Representative Permission granted to share information::  Yes, Verbal Permission Granted  Name::        Agency::     Relationship::     Contact Information:     Housing/Transportation Living arrangements for the past 2 months:  Single Family Home Source of Information:  Patient, Power of Attorney, Adult Children, Medical Team Patient Interpreter Needed:  None Criminal Activity/Legal Involvement Pertinent to Current Situation/Hospitalization:  No - Comment as needed Significant Relationships:  Adult Children, Church, Community Support Lives with:  Adult Children Do you feel safe going back to the place where you live?  Yes Need for family participation in patient care:  No (Coment)  Care giving concerns:  PT/OT recommendation for STR   Social Worker assessment / plan:  CSW met with the patient and her daughters at bedside to discuss discharge planning (SNF vs HH). The family ultimately decided to pursue SNF for the 3 days allowed by [employer] Medicare and will then transition to home health. The CSW has begun the referral process; the main barrier to discharge at this time is receiving the patient's PASRR # from [facility] due to an error in their system and the inability to rectify this outside of business hours. CSW will contact [facility] help desk in the morning to gain this number. Discharge will occur 09/12/17.  Employment status:  Retired Insurance information:  Managed Medicare PT Recommendations:  Skilled Nursing Facility Information / Referral to community resources:  Skilled Nursing Facility  Patient/Family's Response to care:  The family and patient thanked the CSW for assistance.  Patient/Family's  Understanding of and Emotional Response to Diagnosis, Current Treatment, and Prognosis:  The family and patient understand the discharge plan and are in agreement with discharge to SNF.  Emotional Assessment Appearance:  Appears stated age Attitude/Demeanor/Rapport:  Other(Pleasant and cooperative) Affect (typically observed):  Accepting, Appropriate, Pleasant Orientation:  Oriented to Self, Oriented to Place, Oriented to Situation, Oriented to  Time Alcohol / Substance use:  Never Used Psych involvement (Current and /or in the community):  No (Comment)  Discharge Needs  Concerns to be addressed:  Care Coordination, Discharge Planning Concerns Readmission within the last 30 days:  No Current discharge risk:  None Barriers to Discharge:  Awaiting State Approval ([place])    M , LCSW 09/11/2017, 10:49 AM  

## 2017-09-11 NOTE — Care Management Obs Status (Signed)
MEDICARE OBSERVATION STATUS NOTIFICATION   Patient Details  Name: Jessica Gardner MRN: [id number] Date of Birth: May 29, 1930   Medicare Observation Status Notification Given:  Yes    Cinsere Mizrahi A, RN 09/11/2017, 12:28 PM

## 2017-09-11 NOTE — Progress Notes (Signed)
Physical Therapy Treatment Patient Details Name: Jessica Gardner MRN: [id number] DOB: 1929/11/26 Today's Date: 09/11/2017    History of Present Illness 83 yo Female came to ED with left LE weakness; Patient has PMH significant for CVA 4-5 years ago with left sided weakness; However she reports that her left leg has progressively gotten worse; She is not safe for MRI due to pacemaker; recent CT scan was negative for acute abnormality; However MD reports that she likely has another infarct due to small vessel disease;     PT Comments    Pt able to progress to ambulating 15 feet x2 with RW (pt initially min assist first 15 feet and given sitting rest break d/t fatigue; 2nd 15 feet pt requiring mod assist).  Increased L LE fatigue/weakness noted with distance ambulated and fatigue.  Pt demonstrating excellent motivation to participate in therapy and do as much as she can.  Will continue to focus on strengthening, balance, and progressive ambulation per pt tolerance.    Follow Up Recommendations  SNF     Equipment Recommendations  Rolling walker with 5" wheels    Recommendations for Other Services       Precautions / Restrictions Precautions Precautions: Fall Restrictions Weight Bearing Restrictions: No    Mobility  Bed Mobility               General bed mobility comments: Deferred (pt up in chair beginning and end of session)  Transfers Overall transfer level: Needs assistance Equipment used: Rolling walker (2 wheeled) Transfers: Sit to/from Stand Sit to Stand: Min assist;Mod assist         General transfer comment: pt initially attempting to stand with B UE's on RW (from recliner) but eventually pt switched to pushing off B armrests on chair; pt stood from bed using B UE's on walker  Ambulation/Gait Ambulation/Gait assistance: Min assist;Mod assist Ambulation Distance (Feet): (15 feet x2) Assistive device: Rolling walker (2 wheeled)   Gait velocity: decreased   General Gait Details: decreased stance time L LE; decreased R and L step length; narrow BOS; initially min assist to steady ambulating first 15 feet (limited distance d/t fatigue) and mod assist to ambulate 2nd 15 feet (increased L LE knee instability/weakness noted with increased L lean requiring assist for balance and upright); vc's for L LE advancement intermittently   Stairs            Wheelchair Mobility    Modified Rankin (Stroke Patients Only)       Balance Overall balance assessment: Needs assistance Sitting-balance support: No upper extremity supported;Feet supported Sitting balance-Leahy Scale: Good Sitting balance - Comments: steady sitting reaching within BOS   Standing balance support: Bilateral upper extremity supported Standing balance-Leahy Scale: Poor Standing balance comment: requires B UE support on RW for static standing balance                            Cognition Arousal/Alertness: Awake/alert Behavior During Therapy: WFL for tasks assessed/performed Overall Cognitive Status: Within Functional Limits for tasks assessed                                 General Comments: increased time for processing of information      Exercises      General Comments General comments (skin integrity, edema, etc.): Pt resting in recliner upon PT arrival.  Nursing cleared pt for participation  in physical therapy.  Pt agreeable to PT session.      Pertinent Vitals/Pain Pain Assessment: No/denies pain Pain Intervention(s): Limited activity within patient's tolerance;Monitored during session;Repositioned    Home Living                      Prior Function            PT Goals (current goals can now be found in the care plan section) Acute Rehab PT Goals Patient Stated Goal: Patient reports she would like to be stronger and do as much as she can PT Goal Formulation: With patient Time For Goal Achievement: 09/23/17 Potential to  Achieve Goals: Good Progress towards PT goals: Progressing toward goals    Frequency    7X/week      PT Plan Current plan remains appropriate    Co-evaluation              AM-PAC PT "6 Clicks" Daily Activity  Outcome Measure  Difficulty turning over in bed (including adjusting bedclothes, sheets and blankets)?: Unable Difficulty moving from lying on back to sitting on the side of the bed? : Unable Difficulty sitting down on and standing up from a chair with arms (e.g., wheelchair, bedside commode, etc,.)?: Unable Help needed moving to and from a bed to chair (including a wheelchair)?: A Lot Help needed walking in hospital room?: A Lot Help needed climbing 3-5 steps with a railing? : Total 6 Click Score: 8    End of Session Equipment Utilized During Treatment: Gait belt Activity Tolerance: Patient limited by fatigue Patient left: in chair;with call bell/phone within reach;with chair alarm set Nurse Communication: Mobility status;Precautions PT Visit Diagnosis: Unsteadiness on feet (R26.81);Muscle weakness (generalized) (M62.81)     Time: [id number] PT Time Calculation (min) (ACUTE ONLY): 23 min  Charges:  $Gait Training: 23-37 mins                    G CodesHendricks Limes:       Jen Eppinger, PT 09/11/17, 2:02 PM [phone]

## 2017-09-12 MED ORDER — ASPIRIN 81 MG PO TBEC: 81.0000 mg | DELAYED_RELEASE_TABLET | Freq: Every day | ORAL | 0 refills | Status: DC

## 2017-09-12 MED ORDER — ACETAMINOPHEN 325 MG PO TABS: 650.0000 mg | ORAL_TABLET | ORAL | Status: AC | PRN

## 2017-09-12 MED ORDER — POLYETHYLENE GLYCOL 3350 17 G PO PACK: 17.0000 g | PACK | Freq: Every day | ORAL | 0 refills | Status: DC

## 2017-09-12 MED ORDER — CLOPIDOGREL BISULFATE 75 MG PO TABS: 75.0000 mg | ORAL_TABLET | Freq: Every day | ORAL | 0 refills | Status: DC

## 2017-09-12 MED ORDER — ALPRAZOLAM 0.25 MG PO TABS: 0.2500 mg | ORAL_TABLET | Freq: Every evening | ORAL | 0 refills | Status: DC | PRN

## 2017-09-12 MED ORDER — ATORVASTATIN CALCIUM 40 MG PO TABS: 40.0000 mg | ORAL_TABLET | Freq: Every day | ORAL | 0 refills | Status: DC

## 2017-09-12 NOTE — Clinical Social Work Note (Signed)
CSW attempted to contact patient's daughter to present bed offers, left a message on voice mail, awaiting for call back.  Ervin KnackEric R. Quinterrius Errington, MSW, Theresia MajorsLCSWA [phone]  09/12/2017 1:17 PM

## 2017-09-12 NOTE — Discharge Summary (Addendum)
[facility] - [place] at [facility]   PATIENT NAME: Jessica Gardner    MR#:  [id number]  DATE OF BIRTH:  [birth year]  DATE OF ADMISSION:  09/08/2017 ADMITTING PHYSICIAN: Oralia Manis, MD  DATE OF DISCHARGE: 09/13/2017  PRIMARY CARE PHYSICIAN: Barbette Reichmann, MD    ADMISSION DIAGNOSIS:  Weakness [R53.1]  DISCHARGE DIAGNOSIS:  Principal Problem:   Left leg weakness Active Problems:   Diabetes (HCC)   Dementia   History of stroke   SECONDARY DIAGNOSIS:   Past Medical History:  Diagnosis Date  . Dementia   . Diabetes mellitus   . MI (myocardial infarction) (HCC)   . Pacemaker   . Stroke [facility])     HOSPITAL COURSE:   1.  Left-sided weakness and numbness.  Suspected stroke.  The patient had 2 CAT scans of the brain that were negative for stroke.  Echocardiogram showed no source of cardiac emboli.  Carotid ultrasound showed minimal plaque.  Patient on aspirin, Plavix and atorvastatin.  Family interested in short-term rehab.  Awaiting passar.  2. 2.  Hypotension on presentation.  Continue to hold lisinopril, Metoprolol and amlodipine. Blood pressure came up at this point.  With suspected stroke would like blood pressure little higher initially. 3.  Hyperlipidemia unspecified.  I change simvastatin over to atorvastatin 4.  Type 2 diabetes mellitus.  Hemoglobin A1c 6.2.  Discontinue glipizide and continue metformin.  Check fingersticks before meals and nightly. 5.  Depression and anxiety on Paxil and Xanax 6.  GERD on Protonix 7.  History of dementia 8.  Glaucoma unspecified continue eyedrops 9.  Constipation.  Patient had a bowel movement after suppository.  Continue MiraLAX daily 10.  Urinary retention.  Patient required a straight cath yesterday.  Can do straight catheterizations every 8 hours as needed for urinary retention if bladder ultrasound shows greater than 500 mL if she does not urinate.  Spoke with patient's daughter and they would like to keep doing  the in and out catheters rather than a Foley catheter at this point.  Urology consultation as outpatient. 11.  Left shoulder frozen shoulder.  Occupational therapy will need to work with her shoulder. 2.  Fungal infection groin nystatin powder   DISCHARGE CONDITIONS:   Satisfactory  CONSULTS OBTAINED:  Treatment Team:  Thana Farr, MD  DRUG ALLERGIES:  No Known Allergies  DISCHARGE MEDICATIONS:   Allergies as of 09/13/2017   No Known Allergies     Medication List    STOP taking these medications   amLODipine 5 MG tablet Commonly known as:  NORVASC   azithromycin 250 MG tablet Commonly known as:  ZITHROMAX   chlorpheniramine-HYDROcodone 10-8 MG/5ML Suer Commonly known as:  TUSSIONEX   dipyridamole-aspirin 200-25 MG 12hr capsule Commonly known as:  AGGRENOX   glipiZIDE 5 MG 24 hr tablet Commonly known as:  GLUCOTROL XL   guaiFENesin 600 MG 12 hr tablet Commonly known as:  MUCINEX   lisinopril 40 MG tablet Commonly known as:  PRINIVIL,ZESTRIL   metoprolol succinate 50 MG 24 hr tablet Commonly known as:  TOPROL-XL   potassium chloride 10 MEQ tablet Commonly known as:  K-DUR,KLOR-CON   simvastatin 20 MG tablet Commonly known as:  ZOCOR     TAKE these medications   acetaminophen 325 MG tablet Commonly known as:  TYLENOL Take 2 tablets (650 mg total) by mouth every 4 (four) hours as needed for mild pain (or temp > 37.5 C (99.5 F)).   ALPRAZolam 0.25 MG tablet Commonly known as:  XANAX Take 1 tablet (0.25 mg total) by mouth at bedtime as needed for sleep.   aspirin 81 MG EC tablet Take 1 tablet (81 mg total) by mouth daily.   atorvastatin 40 MG tablet Commonly known as:  LIPITOR Take 1 tablet (40 mg total) by mouth daily at 6 PM.   B-12 500 MCG Tabs Take 500 mcg by mouth 2 (two) times daily.   brinzolamide 1 % ophthalmic suspension Commonly known as:  AZOPT Place 1 drop into both eyes 2 (two) times daily.   clopidogrel 75 MG tablet Commonly  known as:  PLAVIX Take 1 tablet (75 mg total) by mouth daily.   gabapentin 100 MG capsule Commonly known as:  NEURONTIN Take 100 mg by mouth at bedtime.   Ipratropium-Albuterol 20-100 MCG/ACT Aers respimat Commonly known as:  COMBIVENT RESPIMAT Inhale 1 puff into the lungs every 6 (six) hours.   latanoprost 0.005 % ophthalmic solution Commonly known as:  XALATAN Place 1 drop into both eyes at bedtime.   metFORMIN 500 MG tablet Commonly known as:  GLUCOPHAGE Take 500 mg by mouth 2 (two) times daily.   nystatin powder Commonly known as:  MYCOSTATIN/NYSTOP Apply topically 2 (two) times daily. Groin area and areas of erythema   omeprazole 20 MG capsule Commonly known as:  PRILOSEC Take 20 mg by mouth daily.   PARoxetine 20 MG tablet Commonly known as:  PAXIL Take 20 mg by mouth daily.   polyethylene glycol packet Commonly known as:  MIRALAX / GLYCOLAX Take 17 g by mouth daily.   POLYSACCHARIDE IRON COMPLEX PO Take 1 capsule by mouth daily.   Vitamin D (Ergocalciferol) 50000 units Caps capsule Commonly known as:  DRISDOL Take 1 capsule by mouth once a week.   Vitamin D3 2000 units capsule Take 2,000 Units by mouth daily.        DISCHARGE INSTRUCTIONS:   Follow-up with Dr. at rehab in 1 day Follow-up with PMD 1 week after discharge from rehab  If you experience worsening of your admission symptoms, develop shortness of breath, life threatening emergency, suicidal or homicidal thoughts you must seek medical attention immediately by calling 911 or calling your MD immediately  if symptoms less severe.  You Must read complete instructions/literature along with all the possible adverse reactions/side effects for all the Medicines you take and that have been prescribed to you. Take any new Medicines after you have completely understood and accept all the possible adverse reactions/side effects.   Please note  You were cared for by a hospitalist during your hospital  stay. If you have any questions about your discharge medications or the care you received while you were in the hospital after you are discharged, you can call the unit and asked to speak with the hospitalist on call if the hospitalist that took care of you is not available. Once you are discharged, your primary care physician will handle any further medical issues. Please note that NO REFILLS for any discharge medications will be authorized once you are discharged, as it is imperative that you return to your primary care physician (or establish a relationship with a primary care physician if you do not have one) for your aftercare needs so that they can reassess your need for medications and monitor your lab values.    Today   CHIEF COMPLAINT:   Chief Complaint  Patient presents with  . Extremity Weakness    HISTORY OF PRESENT ILLNESS:  Jessica Gardner  is a 83 y.o.  female with a known history of came in with left-sided weakness and numbness   VITAL SIGNS:  Blood pressure 122/74, pulse 97, temperature 98.5 F (36.9 C), temperature source Oral, resp. rate (!) 21, height 5\' 1"  (1.549 m), weight 60.1 kg (132 lb 7 oz), SpO2 96 %.   Respirations on my exam was not 22 it was 14.    PHYSICAL EXAMINATION:  GENERAL:  83 y.o.-year-old patient lying in the bed with no acute distress.  EYES: Pupils equal, round, reactive to light and accommodation. No scleral icterus. Extraocular muscles intact.  HEENT: Head atraumatic, normocephalic. Oropharynx and nasopharynx clear.  NECK:  Supple, no jugular venous distention. No thyroid enlargement, no tenderness.  LUNGS: Normal breath sounds bilaterally, no wheezing, rales,rhonchi or crepitation. No use of accessory muscles of respiration.  CARDIOVASCULAR: S1, S2 normal. No murmurs, rubs, or gallops.  ABDOMEN: Soft, non-tender, non-distended. Bowel sounds present. No organomegaly or mass.  EXTREMITIES: No pedal edema, cyanosis, or clubbing.  NEUROLOGIC:  Cranial nerves II through XII are intact. Muscle strength 4+/5 in left extremity. Sensation intact. Gait not checked.  PSYCHIATRIC: The patient is alert and answers questions.  SKIN: Fungal infection groin  DATA REVIEW:   CBC Recent Labs  Lab 09/11/17 0533  WBC 5.1  HGB 10.3*  HCT 30.3*  PLT 316    Chemistries  Recent Labs  Lab 09/11/17 0533  NA 141  K 3.9  CL 107  CO2 26  GLUCOSE 148*  BUN 13  CREATININE 0.94  CALCIUM 9.2    Cardiac Enzymes Recent Labs  Lab 09/08/17 2100  TROPONINI <0.03      Management plans discussed with the patient, and she is in agreement.  Spoke with family today about plan.  CODE STATUS:     Code Status Orders  (From admission, onward)        Start     Ordered   09/09/17 0147  Full code  Continuous     09/09/17 0146    Code Status History    Date Active Date Inactive Code Status Order ID Comments User Context   09/17/2016 15:57 09/18/2016 17:28 Full Code [id number]  Katha HammingKonidena, Snehalatha, MD ED    Advance Directive Documentation     Most Recent Value  Type of Advance Directive  Healthcare Power of Attorney, Living will  Pre-existing out of facility DNR order (yellow form or pink MOST form)  No data  "MOST" Form in Place?  No data      TOTAL TIME TAKING CARE OF THIS PATIENT:  31 minutes.    Alford Highlandichard Jeronda Don M.D on 09/13/2017 at 8:07 AM  Between 7am to 6pm - Pager - [phone]  After 6pm go to [email or web address] - password [email or web address]  [facility] Office  [phone]  CC: Primary care physician; Barbette ReichmannHande, Vishwanath, MD

## 2017-09-12 NOTE — Progress Notes (Signed)
Pt unable to urinate during shift; bladder scanned shows greater than ; in and out cath and was collected. No any other signs of distress; will continue to monitor.

## 2017-09-12 NOTE — Progress Notes (Signed)
Physical Therapy Treatment Patient Details Name: Jessica Gardner MRN: [id number] DOB: 09-22-1929 Today's Date: 09/12/2017    History of Present Illness 83 yo Female came to ED with left LE weakness; Patient has PMH significant for CVA 4-5 years ago with left sided weakness; However she reports that her left leg has progressively gotten worse; She is not safe for MRI due to pacemaker; recent CT scan was negative for acute abnormality; However MD reports that she likely has another infarct due to small vessel disease.    PT Comments    Pt tolerated LE ex's in bed fairly well but fatigued with ambulation after 15 feet (L knee weakness noted with ambulation requiring vc's for L quad set during L stance phase to improve stability).  Will continue to progress pt with strengthening, balance, and progressive ambulation per pt tolerance.   Follow Up Recommendations  SNF     Equipment Recommendations  Rolling walker with 5" wheels    Recommendations for Other Services       Precautions / Restrictions Precautions Precautions: Fall Restrictions Weight Bearing Restrictions: No    Mobility  Bed Mobility Overal bed mobility: Needs Assistance Bed Mobility: Supine to Sit;Sit to Supine     Supine to sit: Supervision;HOB elevated Sit to supine: Min assist;HOB elevated   General bed mobility comments: CGA for safety supine to sit (increased effort and time to perform by pt; use of bed rail); min assist for LE's sit to supine (pt able to use B UE's on siderails and also LE's to boost self up in bed 2x's)  Transfers Overall transfer level: Needs assistance Equipment used: Rolling walker (2 wheeled) Transfers: Sit to/from Stand Sit to Stand: Min assist;Mod assist         General transfer comment: pt standing with B UE's on RW (as she does at home) with min to mod assist to initiate and come to full stand  Ambulation/Gait Ambulation/Gait assistance: Min assist;Mod assist Ambulation  Distance (Feet): 15 Feet Assistive device: Rolling walker (2 wheeled)   Gait velocity: decreased   General Gait Details: decreased stance time L LE; decreased R and L step length; narrow BOS; min to mod assist to steady on L side (L LE knee instability/weakness noted); vc's for L LE advancement intermittently and walker advancement/safe use   Stairs            Wheelchair Mobility    Modified Rankin (Stroke Patients Only)       Balance Overall balance assessment: Needs assistance Sitting-balance support: No upper extremity supported;Feet supported Sitting balance-Leahy Scale: Good Sitting balance - Comments: steady sitting reaching within BOS   Standing balance support: Bilateral upper extremity supported Standing balance-Leahy Scale: Poor Standing balance comment: requires B UE support on RW for static standing balance                            Cognition Arousal/Alertness: Awake/alert Behavior During Therapy: WFL for tasks assessed/performed Overall Cognitive Status: Within Functional Limits for tasks assessed                                 General Comments: increased time for processing of information      Exercises Total Joint Exercises Ankle Circles/Pumps: AROM;Strengthening;Both;10 reps;Supine Quad Sets: AROM;Strengthening;Both;10 reps;Supine Short Arc Quad: AROM;Strengthening;Both;10 reps;Supine Heel Slides: AAROM;Strengthening;Both;10 reps;Supine Hip ABduction/ADduction: AAROM;Strengthening;Both;10 reps;Supine Straight Leg Raises: AAROM;Strengthening;Both;10 reps;Supine  General Comments General comments (skin integrity, edema, etc.): Pt resting in bed upon PT arrival.  Nursing cleared pt for participation in physical therapy.  Pt agreeable to PT session.  Pt's daughter present end of session.      Pertinent Vitals/Pain Pain Assessment: No/denies pain Pain Intervention(s): Limited activity within patient's tolerance;Monitored  during session;Repositioned  HR 103 bpm at rest and increased to 130 bpm post ambulation (HR decreased down to 97 bpm with rest in bed).    Home Living                      Prior Function            PT Goals (current goals can now be found in the care plan section) Acute Rehab PT Goals Patient Stated Goal: Patient reports she would like to be stronger and do as much as she can PT Goal Formulation: With patient Time For Goal Achievement: 09/23/17 Potential to Achieve Goals: Good Progress towards PT goals: Progressing toward goals    Frequency    7X/week      PT Plan Current plan remains appropriate    Co-evaluation              AM-PAC PT "6 Clicks" Daily Activity  Outcome Measure  Difficulty turning over in bed (including adjusting bedclothes, sheets and blankets)?: Unable Difficulty moving from lying on back to sitting on the side of the bed? : Unable Difficulty sitting down on and standing up from a chair with arms (e.g., wheelchair, bedside commode, etc,.)?: Unable Help needed moving to and from a bed to chair (including a wheelchair)?: A Lot Help needed walking in hospital room?: A Lot Help needed climbing 3-5 steps with a railing? : Total 6 Click Score: 8    End of Session Equipment Utilized During Treatment: Gait belt Activity Tolerance: Patient limited by fatigue Patient left: in bed;with call bell/phone within reach;with bed alarm set;with family/visitor present Nurse Communication: Mobility status;Precautions PT Visit Diagnosis: Unsteadiness on feet (R26.81);Muscle weakness (generalized) (M62.81)     Time: [id number] PT Time Calculation (min) (ACUTE ONLY): 28 min  Charges:  $Gait Training: 8-22 mins $Therapeutic Exercise: 8-22 mins                    G CodesHendricks Limes:       Marcella Dunnaway, PT 09/12/17, 9:32 AM [phone]

## 2017-09-12 NOTE — Clinical Social Work Note (Addendum)
CSW spoke to patient's daughter and provided bed offers to her.  [facility] and they are not able to accept patient, however [facility] is able to accept patient for short term rehab.  CSW spoke [facility] and they can not accept patient till tomorrow due to not being able to get patient's meds in tonight.  CSW updated bedside nurse and physician.  CSW was also informed by [facility] that they have received insurance authorization for patient to discharge to SNF on Tuesday.  [facility] will confirm on Tuesday that patient can discharge to the facility and call CSW back.  Ervin KnackEric R. Hassan Rowannterhaus, MSW, Theresia MajorsLCSWA [phone]  09/12/2017 5:49 PM

## 2017-09-12 NOTE — Progress Notes (Signed)
Bladder scan showed 471 mls. MD notified, orders received for in and out cath. In and out cath done with 650mls output. Otilio JeffersonMadelyn S Fenton, RN

## 2017-09-12 NOTE — Progress Notes (Signed)
Patient ID: Sheppard PlumberArlania L Vallin, female   DOB: Jan 24, 1930, 83 y.o.   MRN: [id number]   [facility] PROGRESS NOTE  Sheppard Plumberrlania L Verret NWG:[id number] DOB: Jan 24, 1930 DOA: 09/08/2017 PCP: Barbette ReichmannHande, Vishwanath, MD  HPI/Subjective: Just notified that patient will not be able to go to rehab today by social worker.  The rehab pharmacy has closed.  Patient seen earlier and felt okay.  Objective: Vitals:   09/12/17 1209 09/12/17 1615  BP: 121/64 134/63  Pulse: 95 90  Resp: 20 (!) 24  Temp: 98.9 F (37.2 C) 98.8 F (37.1 C)  SpO2: 98% 100%    Filed Weights   09/08/17 2107 09/09/17 0150  Weight: 59.9 kg (132 lb) 60.1 kg (132 lb 7 oz)    ROS: Review of Systems  Constitutional: Negative for chills and fever.  Eyes: Negative for blurred vision.  Respiratory: Negative for cough and shortness of breath.   Cardiovascular: Negative for chest pain.  Gastrointestinal: Negative for abdominal pain, constipation, diarrhea, nausea and vomiting.  Genitourinary: Negative for dysuria.  Musculoskeletal: Positive for joint pain.  Neurological: Negative for dizziness and headaches.   Exam: Physical Exam  Constitutional: She is oriented to person, place, and time.  HENT:  Nose: No mucosal edema.  Mouth/Throat: No oropharyngeal exudate or posterior oropharyngeal edema.  Eyes: Conjunctivae, EOM and lids are normal. Pupils are equal, round, and reactive to light.  Neck: No JVD present. Carotid bruit is not present. No edema present. No thyroid mass and no thyromegaly present.  Cardiovascular: S1 normal and S2 normal. Exam reveals no gallop.  No murmur heard. Pulses:      Dorsalis pedis pulses are 2+ on the right side, and 2+ on the left side.  Respiratory: No respiratory distress. She has no wheezes. She has no rhonchi. She has no rales.  GI: Soft. Bowel sounds are normal. There is no tenderness.  Musculoskeletal:       Left shoulder: She exhibits decreased range of motion, tenderness and swelling.        Right ankle: She exhibits no swelling.       Left ankle: She exhibits no swelling.  Lymphadenopathy:    She has no cervical adenopathy.  Neurological: She is alert and oriented to person, place, and time. No cranial nerve deficit.  Left shoulder pain to palpation.  Patient able to move left shoulder better today still limited with movement up overhead.  Sensation intact.  Babinski positive left leg.  Power 4 out of 5 on the left upper and lower extremity.  5 out of 5 on the right side.  Skin: Skin is warm. No rash noted. Nails show no clubbing.  Psychiatric: She has a normal mood and affect.      Data Reviewed: Basic Metabolic Panel: Recent Labs  Lab 09/08/17 2100 09/09/17 0410 09/11/17 0533  NA 139  --  141  K 3.6  --  3.9  CL 108  --  107  CO2 25  --  26  GLUCOSE 155*  --  148*  BUN 18  --  13  CREATININE 1.34* 1.17* 0.94  CALCIUM 9.1  --  9.2   CBC: Recent Labs  Lab 09/08/17 2100 09/09/17 0410 09/11/17 0533  WBC 6.3 5.4 5.1  HGB 10.7* 10.4* 10.3*  HCT 32.1* 30.8* 30.3*  MCV 92.5 93.2 92.7  PLT 352 320 316   Cardiac Enzymes: Recent Labs  Lab 09/08/17 2100  TROPONINI <0.03   BNP (last 3 results) Recent Labs    09/17/16  1321  BNP 41.0    Scheduled Meds: . aspirin EC  81 mg Oral Daily  . atorvastatin  40 mg Oral q1800  . brinzolamide  1 drop Both Eyes BID  . clopidogrel  75 mg Oral Daily  . gabapentin  100 mg Oral QHS  . heparin  5,000 Units Subcutaneous Q8H  . latanoprost  1 drop Both Eyes QHS  . pantoprazole  40 mg Oral Daily  . PARoxetine  20 mg Oral Daily  . polyethylene glycol  17 g Oral Daily    Assessment/Plan:  1. Left-sided weakness.  Suspected stroke.  Repeat CT scan negative for stroke..  Echocardiogram showed no source of cardiac emboli.  Carotid ultrasound minimal plaque.  Patient on aspirin, Plavix and atorvastatin.  Family interested in short-term rehab.  Took a while for social worker to contact family about choices of rehabs  and at this time the rehab's pharmacy is closed and unable to get the patient out to rehab today. 2. Hypotension.  Hold lisinopril, metoprolol and amlodipine.  With suspected stroke would like higher blood pressure at this point. 3. Hyperlipidemia unspecified changed simvastatin over to atorvastatin 4. Depression on Paxil 5. GERD on Protonix 6. History of dementia 7. Glaucoma unspecified continue eyedrops 8. Constipation.  Patient had a bowel movement after suppository 9. Urinary retention in and out catheterizations as needed  Code Status:     Code Status Orders  (From admission, onward)        Start     Ordered   09/09/17 0147  Full code  Continuous     09/09/17 0146    Code Status History    Date Active Date Inactive Code Status Order ID Comments User Context   09/17/2016 15:57 09/18/2016 17:28 Full Code [id number]  Katha HammingKonidena, Snehalatha, MD ED    Advance Directive Documentation     Most Recent Value  Type of Advance Directive  Healthcare Power of Attorney, Living will  Pre-existing out of facility DNR order (yellow form or pink MOST form)  No data  "MOST" Form in Place?  No data     Family Communication: Spoke with sister at bedside Disposition Plan: Should have gone out to rehab today.  Consultants:  Neurology  Time spent: 37 minutes  Kady Toothaker [employer]  [employer]

## 2017-09-13 LAB — URINALYSIS, COMPLETE (UACMP) WITH MICROSCOPIC
Bilirubin Urine: NEGATIVE
Glucose, UA: NEGATIVE mg/dL
Ketones, ur: NEGATIVE mg/dL
Leukocytes, UA: NEGATIVE
Nitrite: NEGATIVE
Protein, ur: NEGATIVE mg/dL
Specific Gravity, Urine: 1.013 (ref 1.005–1.030)
pH: 5 (ref 5.0–8.0)

## 2017-09-13 MED ORDER — NYSTATIN 100000 UNIT/GM EX POWD: Freq: Two times a day (BID) | CUTANEOUS | 0 refills | Status: DC

## 2017-09-13 MED ORDER — FLUCONAZOLE 150 MG PO TABS
150.0000 mg | ORAL_TABLET | Freq: Every day | ORAL | Status: DC
  Administered 2017-09-13: 10:00:00 150 mg via ORAL
  Filled 2017-09-13: qty 1

## 2017-09-13 NOTE — Progress Notes (Signed)
Patient ID: Jessica Gardner, female   DOB: 19-Nov-1929, 84 y.o.   MRN: [id number]   [facility] PROGRESS NOTE  Jessica Gardner [id number] DOB: 19-Nov-1929 DOA: 09/08/2017 PCP: Barbette ReichmannHande, Vishwanath, MD  HPI/Subjective: Patient states that she does not have the need to urinate.  She does not have the feeling to urinate.  Bladder scan this morning showed greater than 500 cc.  Spoke with daughter on the phone and they would rather continue with in and out catheters for right now.  Objective: Vitals:   09/12/17 2349 09/13/17 0437  BP: 136/69 122/74  Pulse: 88 97  Resp: 20 (!) 21  Temp: 98.5 F (36.9 C) 98.5 F (36.9 C)  SpO2: 97% 96%    Filed Weights   09/08/17 2107 09/09/17 0150  Weight: 59.9 kg (132 lb) 60.1 kg (132 lb 7 oz)    ROS: Review of Systems  Constitutional: Negative for chills and fever.  Eyes: Negative for blurred vision.  Respiratory: Negative for cough and shortness of breath.   Cardiovascular: Negative for chest pain.  Gastrointestinal: Negative for abdominal pain, constipation, diarrhea, nausea and vomiting.  Genitourinary: Negative for dysuria.  Musculoskeletal: Positive for joint pain.  Neurological: Negative for dizziness and headaches.   Exam: Physical Exam  Constitutional: She is oriented to person, place, and time.  HENT:  Nose: No mucosal edema.  Mouth/Throat: No oropharyngeal exudate or posterior oropharyngeal edema.  Eyes: Conjunctivae, EOM and lids are normal. Pupils are equal, round, and reactive to light.  Neck: No JVD present. Carotid bruit is not present. No edema present. No thyroid mass and no thyromegaly present.  Cardiovascular: S1 normal and S2 normal. Exam reveals no gallop.  No murmur heard. Pulses:      Dorsalis pedis pulses are 2+ on the right side, and 2+ on the left side.  Respiratory: No respiratory distress. She has no wheezes. She has no rhonchi. She has no rales.  GI: Soft. Bowel sounds are normal. There is no  tenderness.  Musculoskeletal:       Left shoulder: She exhibits decreased range of motion, tenderness and swelling.       Right ankle: She exhibits no swelling.       Left ankle: She exhibits no swelling.  Lymphadenopathy:    She has no cervical adenopathy.  Neurological: She is alert and oriented to person, place, and time. No cranial nerve deficit.  Left shoulder pain to palpation.  Patient able to move left shoulder better today still limited with movement up overhead.  Sensation intact.  Babinski positive left leg.  Power 4 out of 5 on the left upper and lower extremity.  5 out of 5 on the right side.  Skin: Skin is warm. No rash noted. Nails show no clubbing.  Psychiatric: She has a normal mood and affect.      Data Reviewed: Basic Metabolic Panel: Recent Labs  Lab 09/08/17 2100 09/09/17 0410 09/11/17 0533  NA 139  --  141  K 3.6  --  3.9  CL 108  --  107  CO2 25  --  26  GLUCOSE 155*  --  148*  BUN 18  --  13  CREATININE 1.34* 1.17* 0.94  CALCIUM 9.1  --  9.2   CBC: Recent Labs  Lab 09/08/17 2100 09/09/17 0410 09/11/17 0533  WBC 6.3 5.4 5.1  HGB 10.7* 10.4* 10.3*  HCT 32.1* 30.8* 30.3*  MCV 92.5 93.2 92.7  PLT 352 320 316   Cardiac Enzymes:  Recent Labs  Lab 09/08/17 2100  TROPONINI <0.03   BNP (last 3 results) Recent Labs    09/17/16 1321  BNP 41.0    Scheduled Meds: . aspirin EC  81 mg Oral Daily  . atorvastatin  40 mg Oral q1800  . brinzolamide  1 drop Both Eyes BID  . clopidogrel  75 mg Oral Daily  . fluconazole  150 mg Oral Daily  . gabapentin  100 mg Oral QHS  . heparin  5,000 Units Subcutaneous Q8H  . latanoprost  1 drop Both Eyes QHS  . pantoprazole  40 mg Oral Daily  . PARoxetine  20 mg Oral Daily  . polyethylene glycol  17 g Oral Daily    Assessment/Plan:  1. Left-sided weakness.  Suspected stroke.  Repeat CT scan negative for stroke..  Echocardiogram showed no source of cardiac emboli.  Carotid ultrasound minimal plaque.  Patient  on aspirin, Plavix and atorvastatin.  Family interested in short-term rehab.  Took a while for social worker to contact family about choices of rehabs and at this time the rehab's pharmacy is closed and unable to get the patient out to rehab today. 2. Hypotension.  Hold lisinopril, metoprolol and amlodipine.  With suspected stroke would like higher blood pressure at this point. 3. Hyperlipidemia unspecified changed simvastatin over to atorvastatin 4. Depression on Paxil 5. GERD on Protonix 6. History of dementia 7. Glaucoma unspecified continue eyedrops 8. Constipation.  Patient had a bowel movement after suppository 9. Urinary retention in and out catheterizations as needed.  Urology consultation as outpatient 10. Fungal infection groin nystatin powder  Code Status:     Code Status Orders  (From admission, onward)        Start     Ordered   09/09/17 0147  Full code  Continuous     09/09/17 0146    Code Status History    Date Active Date Inactive Code Status Order ID Comments User Context   09/17/2016 15:57 09/18/2016 17:28 Full Code [id number]  Katha Hamming, MD ED    Advance Directive Documentation     Most Recent Value  Type of Advance Directive  Healthcare Power of Attorney, Living will  Pre-existing out of facility DNR order (yellow form or pink MOST form)  No data  "MOST" Form in Place?  No data     Family Communication: Spoke with daughter on phone Disposition Plan: Rehab  Consultants:  Neurology  Time spent: 31 minutes  Alona Danford [employer]

## 2017-09-13 NOTE — Plan of Care (Signed)
  Education: Knowledge of General Education information will improve 09/13/2017 0439 - Progressing by Geri SeminoleJackson, Tashika Goodin A, RN   Health Behavior/Discharge Planning: Ability to manage health-related needs will improve 09/13/2017 0439 - Progressing by Geri SeminoleJackson, Wylodean Shimmel A, RN   Clinical Measurements: Ability to maintain clinical measurements within normal limits will improve 09/13/2017 0439 - Progressing by Geri SeminoleJackson, Onesty Clair A, RN Will remain free from infection 09/13/2017 0439 - Progressing by Geri SeminoleJackson, Odell Choung A, RN Diagnostic test results will improve 09/13/2017 0439 - Progressing by Geri SeminoleJackson, Robb Sibal A, RN Respiratory complications will improve 09/13/2017 0439 - Progressing by Geri SeminoleJackson, Dream Harman A, RN Cardiovascular complication will be avoided 09/13/2017 0439 - Progressing by Geri SeminoleJackson, Sherif Millspaugh A, RN   Activity: Risk for activity intolerance will decrease 09/13/2017 0439 - Progressing by Geri SeminoleJackson, Eyvette Cordon A, RN   Nutrition: Adequate nutrition will be maintained 09/13/2017 0439 - Progressing by Geri SeminoleJackson, Virgel Haro A, RN   Coping: Level of anxiety will decrease 09/13/2017 0439 - Progressing by Geri SeminoleJackson, Jabria Loos A, RN   Elimination: Will not experience complications related to bowel motility 09/13/2017 0439 - Progressing by Geri SeminoleJackson, Louretta Tantillo A, RN Will not experience complications related to urinary retention 09/13/2017 0439 - Progressing by Geri SeminoleJackson, Mialee Weyman A, RN   Safety: Ability to remain free from injury will improve 09/13/2017 0439 - Progressing by Geri SeminoleJackson, Teaghan Formica A, RN   Skin Integrity: Risk for impaired skin integrity will decrease 09/13/2017 0439 - Progressing by Geri SeminoleJackson, Heylee Tant A, RN   Education: Knowledge of disease or condition will improve 09/13/2017 0439 - Progressing by Geri SeminoleJackson, Ismael Karge A, RN Knowledge of secondary prevention will improve 09/13/2017 0439 - Progressing by Geri SeminoleJackson, Demetris Meinhardt A, RN Knowledge of patient specific risk factors addressed and post discharge goals established  will improve 09/13/2017 0439 - Progressing by Geri SeminoleJackson, Rakayla Ricklefs A, RN   Coping: Will verbalize positive feelings about self 09/13/2017 0439 - Progressing by Geri SeminoleJackson, Daylynn Stumpp A, RN Will identify appropriate support needs 09/13/2017 0439 - Progressing by Geri SeminoleJackson, Leilah Polimeni A, RN   Self-Care: Ability to participate in self-care as condition permits will improve 09/13/2017 0439 - Progressing by Geri SeminoleJackson, Ginny Loomer A, RN   Ischemic Stroke/TIA Tissue Perfusion: Complications of ischemic stroke/TIA will be minimized 09/13/2017 0439 - Progressing by Geri SeminoleJackson, Kyriakos Babler A, RN

## 2017-09-13 NOTE — Clinical Social Work Note (Addendum)
CSW received phone call from [facility] who confirmed they have received insurance approval for patient to go to SNF today.  Patient to be d/c'ed today to [facility].  Patient and family agreeable to plans will transport via ems RN to call report room 42A [phone].  5:00pm  CSW received phone call from patient's daughter Alvy Bimlerngela [place] [phone] who was upset about the behaviors of the patient's roommate.  Patient's daughter reported that the only reason she agreed to go to SNF was because she was told patient had to be at SNF for 3 days in order to get home health set up.  CSW explained to patient's daughter if she wanted to have patient discharge home with home health, it could have been arranged from the hospital and she did not have to go to SNF first.  Daughter was adamant that she was informed about the 3 day stay at SNF in order to have home health over this past weekend.  CSW informed patient's daughter that the SNF can assist with having home health set up so patient can discharge back home tomorrow, then patient's daughter hung up on this CSW.  Windell MouldingEric Zaviyar Rahal, MSW, Theresia MajorsLCSWA [phone]

## 2017-09-13 NOTE — Clinical Social Work Placement (Signed)
   CLINICAL SOCIAL WORK PLACEMENT  NOTE  Date:  09/13/2017  Patient Details  Name: Sheppard Plumberrlania L Fecteau MRN: [id number] Date of Birth: 1930-06-28  Clinical Social Work is seeking post-discharge placement for this patient at the Skilled  Nursing Facility level of care (*CSW will initial, date and re-position this form in  chart as items are completed):  Yes   Patient/family provided with [facility] Clinical Social Work Department's list of facilities offering this level of care within the geographic area requested by the patient (or if unable, by the patient's family).  Yes   Patient/family informed of their freedom to choose among providers that offer the needed level of care, that participate in Medicare, Medicaid or managed care program needed by the patient, have an available bed and are willing to accept the patient.  Yes   Patient/family informed of [facility]'s ownership interest in [facility] and [facility], as well as of the fact that they are under no obligation to receive care at these facilities.  PASRR submitted to EDS on 09/12/17     PASRR number received on 09/12/17     Existing PASRR number confirmed on       FL2 transmitted to all facilities in geographic area requested by pt/family on 09/11/17     FL2 transmitted to all facilities within larger geographic area on       Patient informed that his/her managed care company has contracts with or will negotiate with certain facilities, including the following:        Yes   Patient/family informed of bed offers received.  Patient chooses bed at [facility]     Physician recommends and patient chooses bed at      Patient to be transferred to [facility] on 09/13/17.  Patient to be transferred to facility by [facility]     Patient family notified on 09/13/17 of transfer.  Name of family member notified:  Patient's daughter was at bedside     PHYSICIAN Please sign FL2      Additional Comment:    _______________________________________________ Darleene CleaverAnterhaus, Layli Capshaw R, LCSWA 09/13/2017, 1:34 PM

## 2017-09-13 NOTE — Progress Notes (Signed)
OT Cancellation Note  Patient Details Name: Jessica Gardner MRN: [id number] DOB: 1930/03/11   Cancelled Treatment:    Reason Eval/Treat Not Completed: Other (comment). Upon attempt to treat this afternoon, pt preparing for discharge to [facility]. Will re-attempt next date if pt does not discharge.   Richrd PrimeJamie Stiller, MPH, MS, OTR/L ascom [phone] 09/13/17, 4:37 PM

## 2017-09-13 NOTE — Progress Notes (Signed)
Pt discharged via non-emergent [facility] to [facility]. Report called to accepting RN. IV removed, pt on room air and in no distress. Aaliyan Brinkmeier S Fenton, RN 

## 2017-09-30 ENCOUNTER — Other Ambulatory Visit: Payer: Self-pay

## 2017-09-30 ENCOUNTER — Emergency Department
Admission: EM | Admit: 2017-09-30 | Discharge: 2017-09-30 | Disposition: A | Discharge status: Home / Self Care | Payer: Medicare Other | Attending: Emergency Medicine | Admitting: Emergency Medicine

## 2017-09-30 ENCOUNTER — Emergency Department: Payer: Medicare Other

## 2017-09-30 ENCOUNTER — Encounter: Payer: Self-pay | Admitting: Emergency Medicine

## 2017-09-30 DIAGNOSIS — E119 Type 2 diabetes mellitus without complications: Secondary | ICD-10-CM | POA: Diagnosis not present

## 2017-09-30 DIAGNOSIS — Z950 Presence of cardiac pacemaker: Secondary | ICD-10-CM | POA: Insufficient documentation

## 2017-09-30 DIAGNOSIS — Z7984 Long term (current) use of oral hypoglycemic drugs: Secondary | ICD-10-CM | POA: Diagnosis not present

## 2017-09-30 DIAGNOSIS — R519 Headache, unspecified: Secondary | ICD-10-CM

## 2017-09-30 DIAGNOSIS — Z7982 Long term (current) use of aspirin: Secondary | ICD-10-CM | POA: Insufficient documentation

## 2017-09-30 DIAGNOSIS — Z7902 Long term (current) use of antithrombotics/antiplatelets: Secondary | ICD-10-CM | POA: Diagnosis not present

## 2017-09-30 DIAGNOSIS — F0390 Unspecified dementia without behavioral disturbance: Secondary | ICD-10-CM | POA: Diagnosis not present

## 2017-09-30 DIAGNOSIS — I252 Old myocardial infarction: Secondary | ICD-10-CM | POA: Diagnosis not present

## 2017-09-30 DIAGNOSIS — R51 Headache: Secondary | ICD-10-CM | POA: Diagnosis not present

## 2017-09-30 MED ORDER — ACETAMINOPHEN 500 MG PO TABS: 1000.0000 mg | ORAL_TABLET | Freq: Four times a day (QID) | ORAL | 0 refills | Status: AC | PRN

## 2017-09-30 MED ORDER — KETOROLAC TROMETHAMINE 60 MG/2ML IM SOLN
30.0000 mg | Freq: Once | INTRAMUSCULAR | Status: AC
  Administered 2017-09-30: 30 mg via INTRAMUSCULAR
  Filled 2017-09-30: qty 2

## 2017-09-30 MED ORDER — DIPHENHYDRAMINE HCL 25 MG PO CAPS: 25.0000 mg | ORAL_CAPSULE | Freq: Three times a day (TID) | ORAL | 0 refills | Status: DC | PRN

## 2017-09-30 NOTE — ED Notes (Signed)
Pt returned from CT at this time.  

## 2017-09-30 NOTE — ED Notes (Signed)
Pt discharged with family to POV in a wheelchair. NAD. VSS. Discharge instructions, RX and follow up reviewed. All questions answered.

## 2017-09-30 NOTE — ED Provider Notes (Signed)
[facility] Emergency Department Provider Note  Time seen: 8:36 AM  I have reviewed the triage vital signs and the nursing notes.   HISTORY  Chief Complaint Headache    HPI Jessica Gardner is a 84 y.o. female with a past medical history of dementia, diabetes, MI, CVA with left-sided deficits presents to the emergency department with a headache.  According to EMS the patient has had a headache for the past several days.  Patient was diagnosed with a CVA affecting her left side approximately 3 weeks ago.  Family was concerned that the headache could be related to the stroke, and called EMS for her to be evaluated in the emergency department.  Here the patient appears well, she complains of a headache across her forehead.  Denies any increased weakness besides the left-sided weakness for the past 3 weeks.  Denies any new numbness.  Denies any fever.  States mild photophobia.  Largely negative review of systems otherwise.   Past Medical History:  Diagnosis Date  . Dementia   . Diabetes mellitus   . MI (myocardial infarction) (HCC)   . Pacemaker   . Stroke [facility](HCC)     Patient Active Problem List   Diagnosis Date Noted  . Left leg weakness 09/09/2017  . Diabetes (HCC) 09/09/2017  . Dementia 09/09/2017  . History of stroke 09/09/2017  . Acute bronchitis 09/18/2016  . Elevated troponin 09/18/2016  . Anemia 09/18/2016  . Renal insufficiency 09/18/2016  . Generalized weakness 09/18/2016  . Chest pain 09/17/2016    Past Surgical History:  Procedure Laterality Date  . PACEMAKER INSERTION      Prior to Admission medications   Medication Sig Start Date End Date Taking? Authorizing Provider  acetaminophen (TYLENOL) 325 MG tablet Take 2 tablets (650 mg total) by mouth every 4 (four) hours as needed for mild pain (or temp > 37.5 C (99.5 F)). 09/12/17   Alford HighlandWieting, Richard, MD  ALPRAZolam Prudy Feeler(XANAX) 0.25 MG tablet Take 1 tablet (0.25 mg total) by mouth at bedtime as  needed for sleep. 09/12/17   Alford HighlandWieting, Richard, MD  aspirin EC 81 MG EC tablet Take 1 tablet (81 mg total) by mouth daily. 09/12/17   Alford HighlandWieting, Richard, MD  atorvastatin (LIPITOR) 40 MG tablet Take 1 tablet (40 mg total) by mouth daily at 6 PM. 09/12/17   Renae GlossWieting, Richard, MD  brinzolamide (AZOPT) 1 % ophthalmic suspension Place 1 drop into both eyes 2 (two) times daily.    [provider]  Cholecalciferol (VITAMIN D3) 2000 units capsule Take 2,000 Units by mouth daily.    [provider]  clopidogrel (PLAVIX) 75 MG tablet Take 1 tablet (75 mg total) by mouth daily. 09/12/17   Alford HighlandWieting, Richard, MD  Cyanocobalamin (B-12) 500 MCG TABS Take 500 mcg by mouth 2 (two) times daily. 10/14/14   [provider]  gabapentin (NEURONTIN) 100 MG capsule Take 100 mg by mouth at bedtime.    [provider]  Ipratropium-Albuterol (COMBIVENT RESPIMAT) 20-100 MCG/ACT AERS respimat Inhale 1 puff into the lungs every 6 (six) hours. 09/18/16   Katharina CaperVaickute, Rima, MD  latanoprost (XALATAN) 0.005 % ophthalmic solution Place 1 drop into both eyes at bedtime.    [provider]  metFORMIN (GLUCOPHAGE) 500 MG tablet Take 500 mg by mouth 2 (two) times daily.    [provider]  nystatin (MYCOSTATIN/NYSTOP) powder Apply topically 2 (two) times daily. Groin area and areas of erythema 09/13/17   Alford HighlandWieting, Richard, MD  omeprazole [facility](PRILOSEC)  20 MG capsule Take 20 mg by mouth daily.    [provider]  PARoxetine (PAXIL) 20 MG tablet Take 20 mg by mouth daily.    [provider]  polyethylene glycol (MIRALAX / GLYCOLAX) packet Take 17 g by mouth daily. 09/12/17   Alford Highland, MD  POLYSACCHARIDE IRON COMPLEX PO Take 1 capsule by mouth daily.    [provider]  Vitamin D, Ergocalciferol, (DRISDOL) 50000 units CAPS capsule Take 1 capsule by mouth once a week.    [provider]    No Known Allergies  Family History  Problem Relation Age of Onset  .  Breast cancer Sister 34    Social History Social History   Tobacco Use  . Smoking status: Never Smoker  . Smokeless tobacco: Never Used  Substance Use Topics  . Alcohol use: No  . Drug use: No    Review of Systems Constitutional: Negative for fever. Eyes: Negative for visual complaints ENT: Negative for recent illness/congestion Cardiovascular: Negative for chest pain. Respiratory: Negative for shortness of breath. Gastrointestinal: Negative for abdominal pain Genitourinary: Negative for urinary compaints Musculoskeletal: Negative for musculoskeletal complaints Skin: Negative for skin complaints  Neurological: Positive for headache.  Left-sided weakness which is chronic times 3 weeks. All other ROS negative  ____________________________________________   PHYSICAL EXAM:  VITAL SIGNS: ED Triage Vitals  Enc Vitals Group     BP 09/30/17 0828 (!) 101/59     Pulse Rate 09/30/17 0828 92     Resp 09/30/17 0828 20     Temp 09/30/17 0828 98.9 F (37.2 C)     Temp Source 09/30/17 0828 Oral     SpO2 09/30/17 0828 97 %     Weight 09/30/17 0829 132 lb (59.9 kg)     Height 09/30/17 0829 5\' 1"  (1.549 m)     Head Circumference --      Peak Flow --      Pain Score 09/30/17 0828 5     Pain Loc --      Pain Edu? --      Excl. in GC? --     Constitutional: Alert and oriented. Well appearing and in no distress. Eyes: Normal exam, no identified photophobia ENT   Head: Normocephalic and atraumatic.   Mouth/Throat: Mucous membranes are moist. Cardiovascular: Normal rate, regular rhythm.  Respiratory: Normal respiratory effort without tachypnea nor retractions. Breath sounds are clear Gastrointestinal: Soft and nontender. No distention.  Musculoskeletal: Nontender with normal range of motion in all extremities.  Neurologic:  Normal speech and language.  Slightly diminished left grip strength. Skin:  Skin is warm, dry and intact.  Psychiatric: Mood and affect are normal.    ____________________________________________   RADIOLOGY  CT shows no acute abnormality  ____________________________________________   INITIAL IMPRESSION / ASSESSMENT AND PLAN / ED COURSE  Pertinent labs & imaging results that were available during my care of the patient were reviewed by me and considered in my medical decision making (see chart for details).  Patient presents to the emergency department with a headache.  Patient states she took Tylenol at home without relief.  Differential would include migraine headache, tension headache, headache related to prior CVA, hemorrhagic conversion.  Will obtain a CT scan of the head to further evaluate.  Overall the patient appears well she does have left-sided weakness but states this is chronic times 3 weeks and denies any increase in her symptoms.  I have reviewed the patient's records including her discharge summary from  approximately 2-1/2 weeks ago when she was diagnosed with a CVA affecting her left side although CT imaging remained negative.  We will repeat a head CT today we will treat with IM Toradol.  I reviewed the patient's prior lab work, renal function appears adequate.  We will closely monitor in the emergency department for symptom relief.  Awaiting family arrival, although the patient herself is a good historian.  CT shows no acute abnormality.  Family is here with the patient, states they were just concerned given the recent stroke.  They are very reassured with the normal/stable appearing CT.  I discussed the use of Tylenol and Benadryl at home as needed for rest or headache and following up with her doctor.  I also discussed my normal headache return precautions.  ____________________________________________   FINAL CLINICAL IMPRESSION(S) / ED DIAGNOSES  Headache    Minna Antis, MD 09/30/17 1025

## 2017-09-30 NOTE — ED Notes (Signed)
Patient transported to CT 

## 2017-09-30 NOTE — ED Triage Notes (Signed)
Pt arrived from home via EMS with complaints of left sided headache. Family came to give her a bath and she told them her head hurt to bad. Pt had stroke 3 weeks ago. Per EMS and family she is having no other neuro deficits. Noise makes headache worse.

## 2018-01-01 ENCOUNTER — Observation Stay (HOSPITAL_COMMUNITY)
Admission: EM | Admit: 2018-01-01 | Discharge: 2018-01-03 | Disposition: A | Discharge status: Home / Self Care | Payer: Medicare Other | Attending: Internal Medicine | Admitting: Internal Medicine

## 2018-01-01 ENCOUNTER — Encounter (HOSPITAL_COMMUNITY): Payer: Self-pay | Admitting: Oncology

## 2018-01-01 ENCOUNTER — Emergency Department (HOSPITAL_COMMUNITY): Payer: Medicare Other

## 2018-01-01 DIAGNOSIS — E876 Hypokalemia: Secondary | ICD-10-CM | POA: Diagnosis not present

## 2018-01-01 DIAGNOSIS — R55 Syncope and collapse: Secondary | ICD-10-CM | POA: Diagnosis not present

## 2018-01-01 DIAGNOSIS — Z950 Presence of cardiac pacemaker: Secondary | ICD-10-CM | POA: Diagnosis not present

## 2018-01-01 DIAGNOSIS — I252 Old myocardial infarction: Secondary | ICD-10-CM | POA: Diagnosis not present

## 2018-01-01 DIAGNOSIS — R4701 Aphasia: Secondary | ICD-10-CM | POA: Insufficient documentation

## 2018-01-01 DIAGNOSIS — Z7984 Long term (current) use of oral hypoglycemic drugs: Secondary | ICD-10-CM | POA: Diagnosis not present

## 2018-01-01 DIAGNOSIS — R2990 Unspecified symptoms and signs involving the nervous system: Secondary | ICD-10-CM

## 2018-01-01 DIAGNOSIS — I10 Essential (primary) hypertension: Secondary | ICD-10-CM | POA: Diagnosis present

## 2018-01-01 DIAGNOSIS — Z7982 Long term (current) use of aspirin: Secondary | ICD-10-CM | POA: Diagnosis not present

## 2018-01-01 DIAGNOSIS — H53129 Transient visual loss, unspecified eye: Secondary | ICD-10-CM | POA: Diagnosis not present

## 2018-01-01 DIAGNOSIS — I6529 Occlusion and stenosis of unspecified carotid artery: Secondary | ICD-10-CM | POA: Diagnosis not present

## 2018-01-01 DIAGNOSIS — E785 Hyperlipidemia, unspecified: Secondary | ICD-10-CM | POA: Diagnosis present

## 2018-01-01 DIAGNOSIS — N390 Urinary tract infection, site not specified: Secondary | ICD-10-CM | POA: Diagnosis present

## 2018-01-01 DIAGNOSIS — R29810 Facial weakness: Secondary | ICD-10-CM | POA: Insufficient documentation

## 2018-01-01 DIAGNOSIS — Z8673 Personal history of transient ischemic attack (TIA), and cerebral infarction without residual deficits: Secondary | ICD-10-CM | POA: Diagnosis present

## 2018-01-01 DIAGNOSIS — Z7902 Long term (current) use of antithrombotics/antiplatelets: Secondary | ICD-10-CM | POA: Insufficient documentation

## 2018-01-01 DIAGNOSIS — B961 Klebsiella pneumoniae [K. pneumoniae] as the cause of diseases classified elsewhere: Secondary | ICD-10-CM | POA: Insufficient documentation

## 2018-01-01 DIAGNOSIS — Z79899 Other long term (current) drug therapy: Secondary | ICD-10-CM | POA: Insufficient documentation

## 2018-01-01 DIAGNOSIS — E1159 Type 2 diabetes mellitus with other circulatory complications: Secondary | ICD-10-CM | POA: Insufficient documentation

## 2018-01-01 DIAGNOSIS — R42 Dizziness and giddiness: Principal | ICD-10-CM

## 2018-01-01 DIAGNOSIS — E860 Dehydration: Secondary | ICD-10-CM | POA: Diagnosis present

## 2018-01-01 DIAGNOSIS — F0390 Unspecified dementia without behavioral disturbance: Secondary | ICD-10-CM | POA: Insufficient documentation

## 2018-01-01 DIAGNOSIS — Z961 Presence of intraocular lens: Secondary | ICD-10-CM | POA: Diagnosis not present

## 2018-01-01 DIAGNOSIS — N3000 Acute cystitis without hematuria: Secondary | ICD-10-CM

## 2018-01-01 DIAGNOSIS — E119 Type 2 diabetes mellitus without complications: Secondary | ICD-10-CM

## 2018-01-01 LAB — I-STAT TROPONIN, ED: Troponin i, poc: 0.01 ng/mL (ref 0.00–0.08)

## 2018-01-01 LAB — DIFFERENTIAL
Basophils Absolute: 0 10*3/uL (ref 0.0–0.1)
Basophils Relative: 1 %
EOS PCT: 4 %
Eosinophils Absolute: 0.2 10*3/uL (ref 0.0–0.7)
LYMPHS ABS: 3.2 10*3/uL (ref 0.7–4.0)
LYMPHS PCT: 52 %
MONOS PCT: 7 %
Monocytes Absolute: 0.5 10*3/uL (ref 0.1–1.0)
NEUTROS ABS: 2.2 10*3/uL (ref 1.7–7.7)
Neutrophils Relative %: 36 %

## 2018-01-01 LAB — COMPREHENSIVE METABOLIC PANEL
ALK PHOS: 53 U/L (ref 38–126)
ALT: 15 U/L (ref 14–54)
AST: 20 U/L (ref 15–41)
Albumin: 3.6 g/dL (ref 3.5–5.0)
Anion gap: 11 (ref 5–15)
BILIRUBIN TOTAL: 0.5 mg/dL (ref 0.3–1.2)
BUN: 20 mg/dL (ref 6–20)
CALCIUM: 9.3 mg/dL (ref 8.9–10.3)
CO2: 25 mmol/L (ref 22–32)
CREATININE: 1.21 mg/dL — AB (ref 0.44–1.00)
Chloride: 105 mmol/L (ref 101–111)
GFR, EST AFRICAN AMERICAN: 46 mL/min — AB (ref >60)
GFR, EST NON AFRICAN AMERICAN: 40 mL/min — AB (ref >60)
Glucose, Bld: 122 mg/dL — ABNORMAL HIGH (ref 65–99)
Potassium: 3.4 mmol/L — ABNORMAL LOW (ref 3.5–5.1)
Sodium: 141 mmol/L (ref 135–145)
TOTAL PROTEIN: 7.2 g/dL (ref 6.5–8.1)

## 2018-01-01 LAB — CBC
HEMATOCRIT: 31.1 % — AB (ref 36.0–46.0)
Hemoglobin: 10.1 g/dL — ABNORMAL LOW (ref 12.0–15.0)
MCH: 30.1 pg (ref 26.0–34.0)
MCHC: 32.5 g/dL (ref 30.0–36.0)
MCV: 92.8 fL (ref 78.0–100.0)
Platelets: 315 10*3/uL (ref 150–400)
RBC: 3.35 MIL/uL — AB (ref 3.87–5.11)
RDW: 12.6 % (ref 11.5–15.5)
WBC: 6.1 10*3/uL (ref 4.0–10.5)

## 2018-01-01 LAB — I-STAT CHEM 8, ED
BUN: 20 mg/dL (ref 6–20)
CHLORIDE: 103 mmol/L (ref 101–111)
CREATININE: 1.2 mg/dL — AB (ref 0.44–1.00)
Calcium, Ion: 1.14 mmol/L — ABNORMAL LOW (ref 1.15–1.40)
Glucose, Bld: 123 mg/dL — ABNORMAL HIGH (ref 65–99)
HEMATOCRIT: 29 % — AB (ref 36.0–46.0)
HEMOGLOBIN: 9.9 g/dL — AB (ref 12.0–15.0)
POTASSIUM: 3.4 mmol/L — AB (ref 3.5–5.1)
Sodium: 142 mmol/L (ref 135–145)
TCO2: 25 mmol/L (ref 22–32)

## 2018-01-01 LAB — APTT: aPTT: 41 seconds — ABNORMAL HIGH (ref 24–36)

## 2018-01-01 LAB — PROTIME-INR
INR: 1.03
Prothrombin Time: 13.4 seconds (ref 11.4–15.2)

## 2018-01-01 LAB — ETHANOL

## 2018-01-01 MED ORDER — IOPAMIDOL (ISOVUE-370) INJECTION 76%
INTRAVENOUS | Status: AC
  Filled 2018-01-01: qty 100

## 2018-01-01 NOTE — Code Documentation (Signed)
84 yo female code stroke via [facility] to [facility] with worsening left sided weakness, aphasia and Left sided droop.  LSW 1915. Upon arrival to ED, pt was alert, following commands, not answering questions appropriately.  Dr. Amada JupiterKirkpatrick at bedside.  NIHSS 5. LVO neg. CT done. No TPA.

## 2018-01-01 NOTE — ED Provider Notes (Signed)
[facility] EMERGENCY DEPARTMENT Provider Note   CSN: [id number] Arrival date & time: 01/01/18  2023   An emergency department physician performed an initial assessment on this suspected stroke patient at 2018.  History   Chief Complaint Chief Complaint  Patient presents with  . Weakness    HPI Jessica Gardner is a 84 y.o. female with a history of dementia, diabetes, MI, CVA with left-sided deficits who presents the emergency department today for worsening stroke symptoms. Patient currently lives at home with her son. Family is at bedside, including both of her daughters who help provide history. Patient reports that she as lying down when she had blacking out of her vision. This was not with standing or positional changes. Brother reportedly saw that patient had worsening of left sided weakness, difficulty with speech, and left sided facial droop per EMS. Her last known normal was 74. Patient symptoms had resolved upon arrival to the department. She reports her vision has returned to baseline and family reports that she does not appear to have increased weakness on that side any longer. This has not reoccurred.  Patient does have dementia but has clear speech, A&O x 3. Family and patient deny her having fever, chills, body aches, URI symptoms, HA, cough, CP, SOB, vertigo, abdominal pain, fall, trauma, URI symptoms, changes in hearing, unilateral neck pain, LOC, N/V, alcohol/drug use, or trauma. No new medications. Patient does not walk at baseline.   HPI  Past Medical History:  Diagnosis Date  . Dementia   . Diabetes mellitus   . MI (myocardial infarction) (HCC)   . Pacemaker   . Stroke [facility])     Patient Active Problem List   Diagnosis Date Noted  . Left leg weakness 09/09/2017  . Diabetes (HCC) 09/09/2017  . Dementia 09/09/2017  . History of stroke 09/09/2017  . Acute bronchitis 09/18/2016  . Elevated troponin 09/18/2016  . Anemia 09/18/2016  . Renal  insufficiency 09/18/2016  . Generalized weakness 09/18/2016  . Chest pain 09/17/2016    Past Surgical History:  Procedure Laterality Date  . PACEMAKER INSERTION       OB History   None      Home Medications    Prior to Admission medications   Medication Sig Start Date End Date Taking? Authorizing Provider  acetaminophen (TYLENOL) 325 MG tablet Take 2 tablets (650 mg total) by mouth every 4 (four) hours as needed for mild pain (or temp > 37.5 C (99.5 F)). 09/12/17  Yes Wieting, Richard, MD  ALPRAZolam Prudy Feeler) 0.25 MG tablet Take 1 tablet (0.25 mg total) by mouth at bedtime as needed for sleep. 09/12/17   Alford Highland, MD  amLODipine (NORVASC) 5 MG tablet Take 5 mg by mouth daily. 09/26/17   [provider]  aspirin EC 81 MG EC tablet Take 1 tablet (81 mg total) by mouth daily. Patient not taking: Reported on 09/30/2017 09/12/17   Alford Highland, MD  atorvastatin (LIPITOR) 40 MG tablet Take 1 tablet (40 mg total) by mouth daily at 6 PM. Patient not taking: Reported on 09/30/2017 09/12/17   Alford Highland, MD  brinzolamide (AZOPT) 1 % ophthalmic suspension Place 1 drop into both eyes 2 (two) times daily.    [provider]  Cholecalciferol (VITAMIN D3) 2000 units capsule Take 2,000 Units by mouth daily.    [provider]  clopidogrel (PLAVIX) 75 MG tablet Take 1 tablet (75 mg total) by mouth daily. Patient not taking: Reported on 09/30/2017 09/12/17  Wieting, Richard, MD  Cyanocobalamin (B-12) 500 MCG TABS Take 500 mcg by mouth 2 (two) times daily. 10/14/14   [provider]  diphenhydrAMINE (BENADRYL) 25 mg capsule Take 1 capsule (25 mg total) by mouth every 8 (eight) hours as needed for sleep. 09/30/17   Minna Antis, MD  dipyridamole-aspirin (AGGRENOX) 200-25 MG 12hr capsule Take 1 capsule by mouth 2 (two) times daily. 09/26/17   [provider]  Fe Fum-FePoly-FA-Vit C-Vit B3 (INTEGRA F) 125-1 MG CAPS Take 1 capsule by mouth  daily. 10/24/17   [provider]  gabapentin (NEURONTIN) 100 MG capsule Take 100 mg by mouth at bedtime.    [provider]  glipiZIDE (GLUCOTROL XL) 5 MG 24 hr tablet Take 1 tablet by mouth daily. 09/26/17   [provider]  Ipratropium-Albuterol (COMBIVENT RESPIMAT) 20-100 MCG/ACT AERS respimat Inhale 1 puff into the lungs every 6 (six) hours. 09/18/16   Katharina Caper, MD  latanoprost (XALATAN) 0.005 % ophthalmic solution Place 1 drop into both eyes at bedtime.    [provider]  lisinopril (PRINIVIL,ZESTRIL) 20 MG tablet Take 20 mg by mouth daily. 09/26/17   [provider]  metFORMIN (GLUCOPHAGE) 500 MG tablet Take 500 mg by mouth 2 (two) times daily.    [provider]  metoprolol succinate (TOPROL-XL) 50 MG 24 hr tablet Take 1 tablet by mouth daily. 09/26/17   [provider]  nystatin (MYCOSTATIN/NYSTOP) powder Apply topically 2 (two) times daily. Groin area and areas of erythema 09/13/17   Alford Highland, MD  omeprazole (PRILOSEC) 20 MG capsule Take 20 mg by mouth daily.    [provider]  PARoxetine (PAXIL) 20 MG tablet Take 20 mg by mouth daily.    [provider]  polyethylene glycol (MIRALAX / GLYCOLAX) packet Take 17 g by mouth daily. 09/12/17   Alford Highland, MD  POLYSACCHARIDE IRON COMPLEX PO Take 1 capsule by mouth daily.    [provider]  simvastatin (ZOCOR) 20 MG tablet Take 20 mg by mouth daily. 09/26/17   [provider]  Vitamin D, Ergocalciferol, (DRISDOL) 50000 units CAPS capsule Take 1 capsule by mouth once a week.    [provider]    Family History Family History  Problem Relation Age of Onset  . Breast cancer Sister 67    Social History Social History   Tobacco Use  . Smoking status: Never Smoker  . Smokeless tobacco: Never Used  Substance Use Topics  . Alcohol use: No  . Drug use: No     Allergies   Patient has no known allergies.   Review  of Systems Review of Systems  All other systems reviewed and are negative.    Physical Exam Updated Vital Signs BP (!) 127/57   Pulse 75   Resp 17   SpO2 97%   Physical Exam  Constitutional: She appears well-developed and well-nourished.  HENT:  Head: Normocephalic and atraumatic.  Right Ear: External ear normal.  Left Ear: External ear normal.  Nose: Nose normal.  Mouth/Throat: Uvula is midline, oropharynx is clear and moist and mucous membranes are normal. No tonsillar exudate.  Eyes: Pupils are equal, round, and reactive to light. Right eye exhibits no discharge. Left eye exhibits no discharge. No scleral icterus.  Neck: Trachea normal. Neck supple. No spinous process tenderness present. No neck rigidity. Normal range of motion present.  Cardiovascular: Normal rate, regular rhythm and intact distal pulses.  No murmur heard. Pulses:      Radial  pulses are 2+ on the right side, and 2+ on the left side.       Dorsalis pedis pulses are 2+ on the right side, and 2+ on the left side.       Posterior tibial pulses are 2+ on the right side, and 2+ on the left side.  No lower extremity swelling or edema. Calves symmetric in size bilaterally.  Pulmonary/Chest: Effort normal and breath sounds normal. She exhibits no tenderness.  Abdominal: Soft. Bowel sounds are normal. There is no tenderness. There is no rebound and no guarding.  Musculoskeletal: She exhibits no edema.  Lymphadenopathy:    She has no cervical adenopathy.  Neurological: She is alert. GCS eye subscore is 4. GCS verbal subscore is 5. GCS motor subscore is 6.  Mental Status:  With baseline dementia.  She has GCS 15.  A and O x3.  Alert, oriented, thought content appropriate. Speech fluent without slurring or evidence of aphasia.  Cranial Nerves:  II:  Peripheral visual fields grossly normal, pupils equal, round, reactive to light III,IV, VI: ptosis not present, extra-ocular motions intact bilaterally  V,VII: Previous  facial droop.  Eyebrows raise symmetric, facial light touch sensation equal VIII: hearing grossly normal to voice  X: uvula elevates symmetrically  XI: bilateral shoulder shrug symmetric and strong XII: midline tongue extension without fassiculations Motor:  Normal tone.  Left upper and left lower extremity weakness when compared to right. Sensory: Sensation intact to light touch in all extremities. Deep Tendon Reflexes: 2+ and symmetric in the biceps and patella Cerebellar: normal finger-to-nose with bilateral upper extremities.  Gait: normal gait and balance CV: distal pulses palpable throughout   Skin: Skin is warm and dry. No rash noted. She is not diaphoretic.  Psychiatric: She has a normal mood and affect.  Nursing note and vitals reviewed.    ED Treatments / Results  Labs (all labs ordered are listed, but only abnormal results are displayed) Labs Reviewed  APTT - Abnormal; Notable for the following components:      Result Value   aPTT 41 (*)    All other components within normal limits  CBC - Abnormal; Notable for the following components:   RBC 3.35 (*)    Hemoglobin 10.1 (*)    HCT 31.1 (*)    All other components within normal limits  COMPREHENSIVE METABOLIC PANEL - Abnormal; Notable for the following components:   Potassium 3.4 (*)    Glucose, Bld 122 (*)    Creatinine, Ser 1.21 (*)    GFR calc non Af Amer 40 (*)    GFR calc Af Amer 46 (*)    All other components within normal limits  I-STAT CHEM 8, ED - Abnormal; Notable for the following components:   Potassium 3.4 (*)    Creatinine, Ser 1.20 (*)    Glucose, Bld 123 (*)    Calcium, Ion 1.14 (*)    Hemoglobin 9.9 (*)    HCT 29.0 (*)    All other components within normal limits  ETHANOL  PROTIME-INR  DIFFERENTIAL  RAPID URINE DRUG SCREEN, HOSP PERFORMED  URINALYSIS, ROUTINE W REFLEX MICROSCOPIC  I-STAT TROPONIN, ED    EKG None  Radiology Ct Head Code Stroke Wo Contrast  Result Date:  01/01/2018 CLINICAL DATA:  Code stroke. LEFT-sided weakness and facial droop, aphasia and blindness. Last seen normal at 1955 hours. Assess stroke. History of strokes, pacemaker, diabetes and dementia. EXAM: CT HEAD WITHOUT CONTRAST TECHNIQUE: Contiguous axial images were obtained from the  base of the skull through the vertex without intravenous contrast. COMPARISON:  CT HEAD September 30, 2017 FINDINGS: BRAIN: No intraparenchymal hemorrhage, mass effect nor midline shift. Moderate to severe parenchymal brain volume loss. Confluent supratentorial white matter hypodensities. Old basal ganglia lacunar infarcts. No acute large vascular territory infarcts. No abnormal extra-axial fluid collections. Basal cisterns are patent. VASCULAR: Severe calcific atherosclerosis of the carotid siphons. SKULL: No skull fracture. Severe LEFT temporomandibular osteoarthrosis. No significant scalp soft tissue swelling. SINUSES/ORBITS: The mastoid air-cells and included paranasal sinuses are well-aerated.The included ocular globes and orbital contents are non-suspicious. Status post bilateral ocular lens implants. OTHER: None. ASPECTS [facility] Stroke Program Early CT Score) - Ganglionic level infarction (caudate, lentiform nuclei, internal capsule, insula, M1-M3 cortex): 7 - Supraganglionic infarction (M4-M6 cortex): 3 Total score (0-10 with 10 being normal): 10 IMPRESSION: 1. No acute intracranial process 2. ASPECTS is 10. 3. Moderate to severe chronic small vessel ischemic disease and old basal ganglia lacunar infarcts. 4. Moderate to severe parenchymal brain volume loss. 5. Critical Value/emergent results text paged to Dr.MCNEILL [facility] via AMION secure system on 01/01/2018 at 8:35 pm, including interpreting physician's phone number. Electronically Signed   By: Awilda Metro M.D.   On: 01/01/2018 20:39    Procedures Procedures (including critical care time)  Medications Ordered in ED Medications  cefTRIAXone (ROCEPHIN)  injection 2 g (has no administration in time range)  sodium chloride 0.9 % bolus 500 mL (has no administration in time range)     Initial Impression / Assessment and Plan / ED Course  I have reviewed the triage vital signs and the nursing notes.  Pertinent labs & imaging results that were available during my care of the patient were reviewed by me and considered in my medical decision making (see chart for details).     84 year old female with a history of CVA with left-sided deficits presented to the emergency department today for possible recrudescence of patient's neuro symptoms versus TIA.  Patient lives at home with son reports her last known normal was at 29.  She reports that while lying down she had blacking out of her vision.  EMS reports that she had worsening of her left-sided weakness, difficulty with speech and left-sided facial droop.  This is now resolved and has not recurred.  Family states she is at her baseline.  Neurology has evaluated the patient and is on board.  No infectious symptoms at home.  Vital signs are reassuring.  Patient's neurologic exam what appears to be her baseline.  CT of the head without any acute intracranial process.  Chronic changes as noted above.  Workup overall reassuring.  Patient's urinalysis is with evidence of a UTI.  Patient is nitrate positive, with moderate leukocytes, too numerous to count white blood cells.  Will send urine culture.  Will start on IV ceftriaxone and IV fluids.  Possible UTIs because the patient's recrudescence of her neuro symptoms.  However feel the patient will need admission for TIA rule out.  Will consult hospitalist.  Appreciate Dr Mikeal Hawthorne for bringing the patient into the hospital.   Patient case seen and discussed with Dr. Corlis Leak who is in agreement with plan.   Final Clinical Impressions(s) / ED Diagnoses   Final diagnoses:  Acute cystitis without hematuria  History of stroke  Neurological symptoms    ED  Discharge Orders    None       Jacinto Halim, Cordelia Poche 01/02/18 0106    Abelino Derrick, MD 01/04/18 [phone]

## 2018-01-01 NOTE — ED Triage Notes (Signed)
Pt bib [facility] from home d/t stroke like sx.  Per EMS upon their arrival pt had left sided facial droop, left sided weakness, no speech and stated she could not see.  Per family LKW [id number].  Pt w/ hx of CVA x 3.  Initial NIH 5.

## 2018-01-01 NOTE — Consult Note (Signed)
Neurology Consultation Reason for Consult: Unresponsiveness Referring Physician: Corlis LeakMackuen, C  CC: Unresponsiveness.   History is obtained from:patient  HPI: Jessica Gardner is a 84 y.o. female with a history of dementia, stroke who presents with acute vision loss is transient.  She called out to her son earlier today, who came in and found her laying down saying that she could not C.  She also felt lightheaded at the time.  She thinks it is possible that she may have passed out for a brief period of time.   LKW: 1955  tpa given?: no, resolution of symptoms Modified Rankin scale: 4  ROS: A 14 point ROS was performed and is negative except as noted in the HPI.   Past Medical History:  Diagnosis Date  . Dementia   . Diabetes mellitus   . MI (myocardial infarction) (HCC)   . Pacemaker   . Stroke [facility](HCC)      Family History  Problem Relation Age of Onset  . Breast cancer Sister 159     Social History:  reports that she has never smoked. She has never used smokeless tobacco. She reports that she does not drink alcohol or use drugs.   Exam: Current vital signs: Vitals:   01/01/18 2315  BP: (!) 127/57  Pulse: 75  Resp: 17  SpO2: 97%    Vital signs in last 24 hours:     Physical Exam  Constitutional: Appears well-developed and well-nourished.  Psych: Affect appropriate to situation Eyes: No scleral injection HENT: No OP obstrucion Head: Normocephalic.  Cardiovascular: Normal rate and regular rhythm.  Respiratory: Effort normal, non-labored breathing GI: Soft.  No distension. There is no tenderness.  Skin: WDI  Neuro: Mental Status: Patient is awake, alert, she is not oriented.  She does not have any signs of aphasia or neglect.  Cranial Nerves: II: She has severe restrictions in her peripheral vision bilaterally, but this is documented on previous neurological exam from December.  Her nasal field is intact in the right eye, and her nasal field is intact in the  left eye, so no hemianopia.  Pupils are  Irregular but reactive bilaterally.   III,IV, VI: EOMI without ptosis or diploplia.  V: Facial sensation is symmetric to temperature VII: Facial movement is mildly diminished in the left VIII: hearing is intact to voice X: Uvula elevates symmetrically XI: Shoulder shrug is symmetric. XII: tongue is midline without atrophy or fasciculations.  Motor: She has mild left arm weakness, but does not drift.  She does drift in bilateral legs equally. Sensory: She endorses decreased sensation in the left arm and leg Cerebellar: No clear ataxia on finger nose finger   I have reviewed labs in epic and the results pertinent to this consultation are: CMP-unremarkable CBC-mild anemia  I have reviewed the images obtained: CT head-unremarkable other than atrophy  Impression: 84 year old female with transient vision loss.  The total visual fields being involved couple with lightheadedness make me think that presyncope is much more likely than TIA or other unilateral symptoms.  Recommendations: 1) continue aspirin Plavix dual therapy 2) treament of UTI per primary team 3) if she continues to have deficits per family, could consider MRI or repeat CT at 48 hours if pacemaker is not compatible. 4) no further recommendations at this time, please call with further questions or concerns.   Ritta SlotMcNeill Kirkpatrick, MD [facility] [phone]  If 7pm- 7am, please page neurology on call as listed in AMION.

## 2018-01-02 DIAGNOSIS — I10 Essential (primary) hypertension: Secondary | ICD-10-CM | POA: Diagnosis present

## 2018-01-02 DIAGNOSIS — R42 Dizziness and giddiness: Secondary | ICD-10-CM

## 2018-01-02 DIAGNOSIS — R55 Syncope and collapse: Secondary | ICD-10-CM | POA: Diagnosis present

## 2018-01-02 DIAGNOSIS — E876 Hypokalemia: Secondary | ICD-10-CM | POA: Diagnosis present

## 2018-01-02 DIAGNOSIS — E785 Hyperlipidemia, unspecified: Secondary | ICD-10-CM | POA: Diagnosis present

## 2018-01-02 DIAGNOSIS — E860 Dehydration: Secondary | ICD-10-CM | POA: Diagnosis present

## 2018-01-02 DIAGNOSIS — N390 Urinary tract infection, site not specified: Secondary | ICD-10-CM | POA: Diagnosis present

## 2018-01-02 LAB — URINALYSIS, ROUTINE W REFLEX MICROSCOPIC
BILIRUBIN URINE: NEGATIVE
Glucose, UA: NEGATIVE mg/dL
Hgb urine dipstick: NEGATIVE
KETONES UR: NEGATIVE mg/dL
Nitrite: POSITIVE — AB
Protein, ur: NEGATIVE mg/dL
SPECIFIC GRAVITY, URINE: 1.015 (ref 1.005–1.030)
SQUAMOUS EPITHELIAL / LPF: NONE SEEN
pH: 5 (ref 5.0–8.0)

## 2018-01-02 LAB — COMPREHENSIVE METABOLIC PANEL
ALK PHOS: 51 U/L (ref 38–126)
ALT: 16 U/L (ref 14–54)
ANION GAP: 11 (ref 5–15)
AST: 19 U/L (ref 15–41)
Albumin: 3.5 g/dL (ref 3.5–5.0)
BILIRUBIN TOTAL: 0.4 mg/dL (ref 0.3–1.2)
BUN: 18 mg/dL (ref 6–20)
CALCIUM: 9.3 mg/dL (ref 8.9–10.3)
CO2: 24 mmol/L (ref 22–32)
CREATININE: 1.17 mg/dL — AB (ref 0.44–1.00)
Chloride: 103 mmol/L (ref 101–111)
GFR calc non Af Amer: 41 mL/min — ABNORMAL LOW (ref >60)
GFR, EST AFRICAN AMERICAN: 48 mL/min — AB (ref >60)
GLUCOSE: 84 mg/dL (ref 65–99)
Potassium: 3.7 mmol/L (ref 3.5–5.1)
SODIUM: 138 mmol/L (ref 135–145)
TOTAL PROTEIN: 6.8 g/dL (ref 6.5–8.1)

## 2018-01-02 LAB — RAPID URINE DRUG SCREEN, HOSP PERFORMED
Amphetamines: NOT DETECTED
BENZODIAZEPINES: NOT DETECTED
Barbiturates: NOT DETECTED
COCAINE: NOT DETECTED
OPIATES: NOT DETECTED
Tetrahydrocannabinol: NOT DETECTED

## 2018-01-02 LAB — CBG MONITORING, ED
Glucose-Capillary: 72 mg/dL (ref 65–99)
Glucose-Capillary: 225 mg/dL — ABNORMAL HIGH (ref 65–99)
Glucose-Capillary: 133 mg/dL — ABNORMAL HIGH (ref 65–99)

## 2018-01-02 LAB — HEMOGLOBIN A1C
HEMOGLOBIN A1C: 5.6 % (ref 4.8–5.6)
Mean Plasma Glucose: 114.02 mg/dL

## 2018-01-02 LAB — TSH: TSH: 2.948 u[IU]/mL (ref 0.350–4.500)

## 2018-01-02 LAB — CBC
HEMATOCRIT: 31.2 % — AB (ref 36.0–46.0)
HEMOGLOBIN: 10.2 g/dL — AB (ref 12.0–15.0)
MCH: 30.6 pg (ref 26.0–34.0)
MCHC: 32.7 g/dL (ref 30.0–36.0)
MCV: 93.7 fL (ref 78.0–100.0)
Platelets: 326 10*3/uL (ref 150–400)
RBC: 3.33 MIL/uL — ABNORMAL LOW (ref 3.87–5.11)
RDW: 12.7 % (ref 11.5–15.5)
WBC: 6.1 10*3/uL (ref 4.0–10.5)

## 2018-01-02 MED ORDER — POLYETHYLENE GLYCOL 3350 17 G PO PACK
17.0000 g | PACK | Freq: Every day | ORAL | Status: DC
  Administered 2018-01-02 – 2018-01-03 (×2): 17 g via ORAL
  Filled 2018-01-02 (×2): qty 1

## 2018-01-02 MED ORDER — CLOPIDOGREL BISULFATE 75 MG PO TABS
75.0000 mg | ORAL_TABLET | Freq: Every day | ORAL | Status: DC
  Administered 2018-01-02 – 2018-01-03 (×2): 75 mg via ORAL
  Filled 2018-01-02 (×2): qty 1

## 2018-01-02 MED ORDER — HYDROCODONE-ACETAMINOPHEN 5-325 MG PO TABS
1.0000 | ORAL_TABLET | ORAL | Status: DC | PRN
  Filled 2018-01-02: qty 1

## 2018-01-02 MED ORDER — ACETAMINOPHEN 325 MG PO TABS: 650.0000 mg | ORAL_TABLET | ORAL | Status: DC | PRN

## 2018-01-02 MED ORDER — PANTOPRAZOLE SODIUM 40 MG PO TBEC
40.0000 mg | DELAYED_RELEASE_TABLET | Freq: Every day | ORAL | Status: DC
  Administered 2018-01-02 – 2018-01-03 (×2): 40 mg via ORAL
  Filled 2018-01-02 (×2): qty 1

## 2018-01-02 MED ORDER — ONDANSETRON HCL 4 MG PO TABS: 4.0000 mg | ORAL_TABLET | Freq: Four times a day (QID) | ORAL | Status: DC | PRN

## 2018-01-02 MED ORDER — ENOXAPARIN SODIUM 40 MG/0.4ML ~~LOC~~ SOLN
40.0000 mg | SUBCUTANEOUS | Status: DC
  Administered 2018-01-02 – 2018-01-03 (×2): 40 mg via SUBCUTANEOUS
  Filled 2018-01-02 (×3): qty 0.4

## 2018-01-02 MED ORDER — LATANOPROST 0.005 % OP SOLN
1.0000 [drp] | Freq: Every day | OPHTHALMIC | Status: DC
  Administered 2018-01-02 (×2): 1 [drp] via OPHTHALMIC
  Filled 2018-01-02: qty 2.5

## 2018-01-02 MED ORDER — ONDANSETRON HCL 4 MG/2ML IJ SOLN: 4.0000 mg | Freq: Four times a day (QID) | INTRAMUSCULAR | Status: DC | PRN

## 2018-01-02 MED ORDER — ASPIRIN-DIPYRIDAMOLE ER 25-200 MG PO CP12
1.0000 | ORAL_CAPSULE | Freq: Two times a day (BID) | ORAL | Status: DC
  Administered 2018-01-02 – 2018-01-03 (×3): 1 via ORAL
  Filled 2018-01-02 (×5): qty 1

## 2018-01-02 MED ORDER — CEFTRIAXONE SODIUM 1 G IJ SOLR
2.0000 g | Freq: Once | INTRAMUSCULAR | Status: DC
  Filled 2018-01-02: qty 20

## 2018-01-02 MED ORDER — ATORVASTATIN CALCIUM 40 MG PO TABS: 40.0000 mg | ORAL_TABLET | Freq: Every day | ORAL | Status: DC

## 2018-01-02 MED ORDER — METFORMIN HCL 500 MG PO TABS
500.0000 mg | ORAL_TABLET | Freq: Two times a day (BID) | ORAL | Status: DC
  Administered 2018-01-02 – 2018-01-03 (×2): 500 mg via ORAL
  Filled 2018-01-02 (×2): qty 1

## 2018-01-02 MED ORDER — SIMVASTATIN 20 MG PO TABS
20.0000 mg | ORAL_TABLET | Freq: Every day | ORAL | Status: DC
  Administered 2018-01-02 – 2018-01-03 (×2): 20 mg via ORAL
  Filled 2018-01-02 (×3): qty 1

## 2018-01-02 MED ORDER — VITAMIN D (ERGOCALCIFEROL) 1.25 MG (50000 UNIT) PO CAPS: 50000.0000 [IU] | ORAL_CAPSULE | ORAL | Status: DC

## 2018-01-02 MED ORDER — SODIUM CHLORIDE 0.9 % IV SOLN
1.0000 g | INTRAVENOUS | Status: DC
  Administered 2018-01-02 – 2018-01-03 (×2): 1 g via INTRAVENOUS
  Filled 2018-01-02: qty 10

## 2018-01-02 MED ORDER — GLIPIZIDE ER 5 MG PO TB24
5.0000 mg | ORAL_TABLET | Freq: Every day | ORAL | Status: DC
  Administered 2018-01-02 – 2018-01-03 (×2): 5 mg via ORAL
  Filled 2018-01-02 (×4): qty 1

## 2018-01-02 MED ORDER — FA-PYRIDOXINE-CYANOCOBALAMIN 2.5-25-2 MG PO TABS
1.0000 | ORAL_TABLET | Freq: Every day | ORAL | Status: DC
Start: 2018-01-02 — End: 2018-01-03
  Administered 2018-01-02 – 2018-01-03 (×2): 1 via ORAL
  Filled 2018-01-02 (×3): qty 1

## 2018-01-02 MED ORDER — AMLODIPINE BESYLATE 5 MG PO TABS
5.0000 mg | ORAL_TABLET | Freq: Every day | ORAL | Status: DC
  Administered 2018-01-03: 5 mg via ORAL
  Filled 2018-01-02 (×2): qty 1

## 2018-01-02 MED ORDER — IPRATROPIUM-ALBUTEROL 0.5-2.5 (3) MG/3ML IN SOLN
3.0000 mL | Freq: Four times a day (QID) | RESPIRATORY_TRACT | Status: DC
  Administered 2018-01-02 (×3): 3 mL via RESPIRATORY_TRACT
  Filled 2018-01-02: qty 3

## 2018-01-02 MED ORDER — PAROXETINE HCL 20 MG PO TABS
20.0000 mg | ORAL_TABLET | Freq: Every day | ORAL | Status: DC
  Administered 2018-01-02 – 2018-01-03 (×2): 20 mg via ORAL
  Filled 2018-01-02 (×3): qty 1

## 2018-01-02 MED ORDER — VITAMIN D 1000 UNITS PO TABS
2000.0000 [IU] | ORAL_TABLET | Freq: Every day | ORAL | Status: DC
  Administered 2018-01-02 – 2018-01-03 (×2): 2000 [IU] via ORAL
  Filled 2018-01-02 (×4): qty 2

## 2018-01-02 MED ORDER — NYSTATIN 100000 UNIT/GM EX POWD
Freq: Two times a day (BID) | CUTANEOUS | Status: DC
  Administered 2018-01-02 (×2): via TOPICAL
  Administered 2018-01-02: 1 via TOPICAL
  Administered 2018-01-03: 10:00:00 via TOPICAL
  Filled 2018-01-02: qty 15

## 2018-01-02 MED ORDER — SODIUM CHLORIDE 0.9 % IV SOLN
INTRAVENOUS | Status: DC
  Administered 2018-01-02: 03:00:00 via INTRAVENOUS

## 2018-01-02 MED ORDER — METOPROLOL SUCCINATE ER 50 MG PO TB24
50.0000 mg | ORAL_TABLET | Freq: Every day | ORAL | Status: DC
  Administered 2018-01-02 – 2018-01-03 (×2): 50 mg via ORAL
  Filled 2018-01-02: qty 1

## 2018-01-02 MED ORDER — DIPHENHYDRAMINE HCL 25 MG PO CAPS
25.0000 mg | ORAL_CAPSULE | Freq: Three times a day (TID) | ORAL | Status: DC | PRN
Start: 2018-01-02 — End: 2018-01-03

## 2018-01-02 MED ORDER — POLYSACCHARIDE IRON COMPLEX 150 MG PO CAPS
150.0000 mg | ORAL_CAPSULE | Freq: Every day | ORAL | Status: DC
  Administered 2018-01-02 – 2018-01-03 (×2): 150 mg via ORAL
  Filled 2018-01-02 (×4): qty 1

## 2018-01-02 MED ORDER — GABAPENTIN 100 MG PO CAPS
100.0000 mg | ORAL_CAPSULE | Freq: Every day | ORAL | Status: DC
  Administered 2018-01-02: 100 mg via ORAL
  Filled 2018-01-02: qty 1

## 2018-01-02 MED ORDER — ALPRAZOLAM 0.25 MG PO TABS: 0.2500 mg | ORAL_TABLET | Freq: Every evening | ORAL | Status: DC | PRN

## 2018-01-02 MED ORDER — IPRATROPIUM-ALBUTEROL 20-100 MCG/ACT IN AERS: 1.0000 | INHALATION_SPRAY | Freq: Four times a day (QID) | RESPIRATORY_TRACT | Status: DC

## 2018-01-02 MED ORDER — LISINOPRIL 20 MG PO TABS
20.0000 mg | ORAL_TABLET | Freq: Every day | ORAL | Status: DC
  Administered 2018-01-03: 20 mg via ORAL
  Filled 2018-01-02 (×2): qty 1

## 2018-01-02 MED ORDER — INSULIN ASPART 100 UNIT/ML ~~LOC~~ SOLN
0.0000 [IU] | Freq: Three times a day (TID) | SUBCUTANEOUS | Status: DC
  Administered 2018-01-02: 1 [IU] via SUBCUTANEOUS
  Administered 2018-01-02: 3 [IU] via SUBCUTANEOUS
  Filled 2018-01-02 (×2): qty 1

## 2018-01-02 MED ORDER — IPRATROPIUM-ALBUTEROL 0.5-2.5 (3) MG/3ML IN SOLN: 3.0000 mL | Freq: Four times a day (QID) | RESPIRATORY_TRACT | Status: DC | PRN

## 2018-01-02 MED ORDER — BRINZOLAMIDE 1 % OP SUSP
1.0000 [drp] | Freq: Two times a day (BID) | OPHTHALMIC | Status: DC
  Administered 2018-01-02 – 2018-01-03 (×3): 1 [drp] via OPHTHALMIC
  Filled 2018-01-02: qty 10

## 2018-01-02 MED ORDER — CYANOCOBALAMIN 500 MCG PO TABS
500.0000 ug | ORAL_TABLET | Freq: Two times a day (BID) | ORAL | Status: DC
  Administered 2018-01-02 – 2018-01-03 (×3): 500 ug via ORAL
  Filled 2018-01-02 (×4): qty 1

## 2018-01-02 MED ORDER — SODIUM CHLORIDE 0.9 % IV BOLUS
500.0000 mL | Freq: Once | INTRAVENOUS | Status: AC
  Administered 2018-01-02: 500 mL via INTRAVENOUS

## 2018-01-02 MED ORDER — METOPROLOL SUCCINATE ER 50 MG PO TB24
50.0000 mg | ORAL_TABLET | Freq: Every day | ORAL | Status: DC
  Filled 2018-01-02 (×2): qty 1

## 2018-01-02 NOTE — Evaluation (Signed)
Physical Therapy Evaluation Patient Details Name: Jessica Gardner MRN: [id number] DOB: [birth year] Today's Date: 01/02/2018   History of Present Illness  84 yo came to ED with blacking out and weakness, head CT (-). PMhx: CVA with left hemiparesis, DM, dementia, HTN, HLD, pacemaker  Clinical Impression  Pt pleasant with flat affect. Pt would state name and relationship to daughters but delayed with response to questions and commands. Daughters present throughout session and providing home setup and PLOF. Per family pt was walking and working with HHPT a few months ago but then began refusing to move with P.T. And over the last month decreased ability to care for herself/refusal to move. Per daughters she has 24hr care and they plan for her to return home. Family quite surprised to see pt moving and walking today. Per family pt is at baseline functional status and has necessary assist at home but would benefit from additional trial of HHPT to return pt to increased mobility. Will follow acutely to maximize gait, transfers and function to decrease burden of care.     Follow Up Recommendations Home health PT;Supervision/Assistance - 24 hour    Equipment Recommendations  None recommended by PT    Recommendations for Other Services       Precautions / Restrictions Precautions Precautions: Fall      Mobility  Bed Mobility Overal bed mobility: Needs Assistance Bed Mobility: Supine to Sit     Supine to sit: Min guard;HOB elevated     General bed mobility comments: cues for use of rail, increased time, HOB 30 degrees  Transfers Overall transfer level: Needs assistance   Transfers: Sit to/from Stand Sit to Stand: Min assist         General transfer comment: cues for hand placement with assist to rise, pt fearful of falling and needs reassurance  Ambulation/Gait Ambulation/Gait assistance: Min assist Ambulation Distance (Feet): 15 Feet Assistive device: 1 person hand held  assist Gait Pattern/deviations: Step-through pattern;Decreased stride length;Shuffle   Gait velocity interpretation: <1.31 ft/sec, indicative of household ambulator General Gait Details: pt with cautious, unsteady steps with assist for balance and weight shift, pt reliant on UE support for gait with cues to continue stepping.   Stairs            Wheelchair Mobility    Modified Rankin (Stroke Patients Only)       Balance Overall balance assessment: Needs assistance   Sitting balance-Leahy Scale: Fair       Standing balance-Leahy Scale: Poor Standing balance comment: reliant on UE support in standing with flexed trunk                              Pertinent Vitals/Pain Pain Assessment: No/denies pain    Home Living Family/patient expects to be discharged to:: Private residence Living Arrangements: Children Available Help at Discharge: Family;Available 24 hours/day Type of Home: House Home Access: Stairs to enter Entrance Stairs-Rails: Doctor, general practiceight;Left Entrance Stairs-Number of Steps: 3 Home Layout: One level Home Equipment: Walker - 4 wheels;Wheelchair - manual;Bedside commode      Prior Function Level of Independence: Needs assistance   Gait / Transfers Assistance Needed: used Rollator when walking in the house; would use wheelchair when leaving home for doctor appointments etc; had refused to walk for the last month  ADL's / Homemaking Assistance Needed: son helped with homemaking; patient has home aide that comes every other day to assist with bathing/dressing and meal prep  Comments: 2 person assist for stairs     Hand Dominance        Extremity/Trunk Assessment   Upper Extremity Assessment Upper Extremity Assessment: LUE deficits/detail;Generalized weakness    Lower Extremity Assessment Lower Extremity Assessment: LLE deficits/detail LLE Deficits / Details: pt with baseline left hemiparesis from cVA    Cervical / Trunk Assessment Cervical  / Trunk Assessment: Kyphotic  Communication   Communication: No difficulties  Cognition Arousal/Alertness: Awake/alert Behavior During Therapy: Flat affect Overall Cognitive Status: Impaired/Different from baseline Area of Impairment: Memory;Following commands                     Memory: Decreased short-term memory Following Commands: Follows one step commands with increased time              General Comments      Exercises     Assessment/Plan    PT Assessment Patient needs continued PT services  PT Problem List Decreased strength;Decreased mobility;Decreased safety awareness;Decreased range of motion;Decreased activity tolerance;Decreased balance;Decreased knowledge of use of DME;Decreased cognition;Decreased coordination       PT Treatment Interventions Gait training;Therapeutic exercise;Patient/family education;Stair training;Functional mobility training;Balance training;DME instruction;Therapeutic activities    PT Goals (Current goals can be found in the Care Plan section)  Acute Rehab PT Goals Patient Stated Goal: return home PT Goal Formulation: With patient/family Time For Goal Achievement: 01/16/18 Potential to Achieve Goals: Fair    Frequency Min 3X/week   Barriers to discharge        Co-evaluation               AM-PAC PT "6 Clicks" Daily Activity  Outcome Measure Difficulty turning over in bed (including adjusting bedclothes, sheets and blankets)?: A Little Difficulty moving from lying on back to sitting on the side of the bed? : A Little Difficulty sitting down on and standing up from a chair with arms (e.g., wheelchair, bedside commode, etc,.)?: Unable Help needed moving to and from a bed to chair (including a wheelchair)?: A Little Help needed walking in hospital room?: A Little Help needed climbing 3-5 steps with a railing? : A Lot 6 Click Score: 15    End of Session Equipment Utilized During Treatment: Gait belt Activity  Tolerance: Patient tolerated treatment well Patient left: in chair;with call bell/phone within reach;with family/visitor present Nurse Communication: Mobility status;Precautions PT Visit Diagnosis: Other abnormalities of gait and mobility (R26.89);Muscle weakness (generalized) (M62.81)    Time: 1202-1221 PT Time Calculation (min) (ACUTE ONLY): 19 min   Charges:   PT Evaluation $PT Eval Moderate Complexity: 1 Mod     PT G Codes:        Delaney Meigs, PT [phone]   Sevanna Ballengee B Loretta Kluender 01/02/2018, 12:59 PM

## 2018-01-02 NOTE — ED Notes (Signed)
Pt sitting at bedside and states she does not want to return to bed at present. Call bell in lap.

## 2018-01-02 NOTE — ED Notes (Signed)
Pt transported to unit with this RN. Assisted her in transferring into a hospital bed. Pt alert and oriented at this time. Oriented pt to surroundings, call bell within reach. Pt resting at this time.

## 2018-01-02 NOTE — ED Notes (Signed)
Pt's dinner tray arrived. 

## 2018-01-02 NOTE — ED Notes (Signed)
pT CLEANED OF SMALL AMOUNT OF URINE WITH LINEN CHANGE.

## 2018-01-02 NOTE — ED Notes (Signed)
Fed pt. Pt ate roughly half of meal on tray.

## 2018-01-02 NOTE — Evaluation (Signed)
Clinical/Bedside Swallow Evaluation Patient Details  Name: Jessica Gardner MRN: [id number] Date of Birth: 1930/01/04  Today's Date: 01/02/2018 Time: SLP Start Time (ACUTE ONLY): 1342 SLP Stop Time (ACUTE ONLY): 1355 SLP Time Calculation (min) (ACUTE ONLY): 13 min  Past Medical History:  Past Medical History:  Diagnosis Date  . Dementia   . Diabetes mellitus   . MI (myocardial infarction) (HCC)   . Pacemaker   . Stroke [facility])    Past Surgical History:  Past Surgical History:  Procedure Laterality Date  . PACEMAKER INSERTION     HPI:  84 yo came to ED with blacking out and weakness, head CT (-). PMhx: CVA with left hemiparesis, DM, dementia, HTN, HLD, pacemaker. per pt report, she used to have a PEG following a remote stroke but she was able to eat unrestricted POs PTA.   Assessment / Plan / Recommendation Clinical Impression  Pt has signs of a mild dysphagia including reduced left-sided labial seal and intermittent, trace-minimal left-sided anterior loss. Audible swallows are noted. Per chart review, her subtle left-sided facial weakness is her baseline from a prior stroke, making it likely that her swallow function is likely at baseline as well. Although these deficits coupled with her cognitive status could increase her risk for aspiration, there were no overt signs of aspiration noted across challenging and self-feeding under SLP supervision. RN denies any difficulty during meals and med administration. Recommend to continue current diet with use of aspiration precautions. No acute SLP needs identified. SLP Visit Diagnosis: Dysphagia, oral phase (R13.11)    Aspiration Risk  Mild aspiration risk    Diet Recommendation Regular;Thin liquid   Liquid Administration via: Cup;Straw Medication Administration: Whole meds with liquid Supervision: Patient able to self feed;Intermittent supervision to cue for compensatory strategies Compensations: Slow rate;Small sips/bites Postural  Changes: Seated upright at 90 degrees    Other  Recommendations Oral Care Recommendations: Oral care BID   Follow up Recommendations None      Frequency and Duration            Prognosis        Swallow Study   General HPI: 84 yo came to ED with blacking out and weakness, head CT (-). PMhx: CVA with left hemiparesis, DM, dementia, HTN, HLD, pacemaker. per pt report, she used to have a PEG following a remote stroke but she was able to eat unrestricted POs PTA. Type of Study: Bedside Swallow Evaluation Previous Swallow Assessment: none in chart - prior SLP screen from December 2018 indicating no SLP needs at that time for speech or swallowing Diet Prior to this Study: Regular;Thin liquids Temperature Spikes Noted: No Respiratory Status: Room air History of Recent Intubation: No Behavior/Cognition: Alert;Cooperative;Pleasant mood;Requires cueing Oral Cavity Assessment: Within Functional Limits Oral Care Completed by SLP: No Oral Cavity - Dentition: Missing dentition Vision: Functional for self-feeding Self-Feeding Abilities: Able to feed self Patient Positioning: Upright in chair Baseline Vocal Quality: Normal(pt intermittently mouths words without voicing) Volitional Cough: Strong Volitional Swallow: Able to elicit    Oral/Motor/Sensory Function Overall Oral Motor/Sensory Function: Mild impairment(baseline) Facial ROM: Reduced left;Suspected CN VII (facial) dysfunction Facial Symmetry: Abnormal symmetry left;Suspected CN VII (facial) dysfunction Facial Strength: Reduced left;Suspected CN VII (facial) dysfunction Lingual ROM: Within Functional Limits Lingual Symmetry: Within Functional Limits Lingual Strength: Within Functional Limits Mandible: Within Functional Limits   Ice Chips Ice chips: Not tested   Thin Liquid Thin Liquid: Impaired Presentation: Cup;Self Fed;Straw Oral Phase Impairments: Reduced labial seal Oral Phase Functional Implications:  Left anterior  spillage Pharyngeal  Phase Impairments: Other (comments)(audible swallow)    Nectar Thick Nectar Thick Liquid: Not tested   Honey Thick Honey Thick Liquid: Not tested   Puree Puree: Impaired Presentation: Self Fed;Spoon Oral Phase Impairments: Reduced labial seal Pharyngeal Phase Impairments: Other (comments)(audible swallow)   Solid   GO   Solid: Impaired Presentation: Self Fed Oral Phase Impairments: Reduced labial seal Oral Phase Functional Implications: Left anterior spillage Pharyngeal Phase Impairments: Other (comments)(audible swallow)        Maxcine Hamaiewonsky, Reeanna Acri 01/02/2018,2:14 PM  Maxcine HamLaura Paiewonsky, M.A. CCC-SLP [phone]

## 2018-01-02 NOTE — ED Notes (Signed)
Ordered pt's heart healthy/carb modified dinner tray. 

## 2018-01-02 NOTE — ED Notes (Signed)
Pt drank well from cup and drank well from straw. Denies difficulty swallowing. Takes bites of diet without cough or difficulty. Lungs remain clear.States no special diet.

## 2018-01-02 NOTE — ED Notes (Signed)
Pt c/o difficulty swallowing. Family at bedside. Dr. Benjamine MolaVann contacted and states to hold medication except plavix, simvastatina and aggrenox.

## 2018-01-02 NOTE — H&P (Signed)
History and Physical    Jessica Gardner ZOX:[id number] DOB: [birth year] DOA: 01/01/2018  Referring MD/NP/PA: Leary Roca, PA  PCP: Barbette Reichmann, MD   Outpatient Specialists: none   Patient coming from: home  Chief Complaint: weakness and almost passing out  HPI: Jessica Gardner is a 84 y.o. female with medical history significant of previous CVA with dementia, diabetes left-sided residual deficits hypertension and hyperlipidemia who was brought in from home after blacking out. This was followed by generalized weakness, difficulty with speech and left-sided facial droop which are worsened from her baseline. No fever or chills. Patient is a poor historian.    ED Course: Patient evaluated as a case of possible CVA. Urinalysis showed positive nitrite was WBC too numerous to count, white count is 6.1 hemoglobin 9.9, glucose 123 with a creatinine 1.20.Head CT without contrast is negative. Neurology consulted but CVA deemed less likely  Review of Systems: As per HPI otherwise 10 point review of systems negative.    Past Medical History:  Diagnosis Date  . Dementia   . Diabetes mellitus   . MI (myocardial infarction) (HCC)   . Pacemaker   . Stroke [facility])     Past Surgical History:  Procedure Laterality Date  . PACEMAKER INSERTION       reports that she has never smoked. She has never used smokeless tobacco. She reports that she does not drink alcohol or use drugs.  No Known Allergies  Family History  Problem Relation Age of Onset  . Breast cancer Sister 80     Prior to Admission medications   Medication Sig Start Date End Date Taking? Authorizing Provider  acetaminophen (TYLENOL) 325 MG tablet Take 2 tablets (650 mg total) by mouth every 4 (four) hours as needed for mild pain (or temp > 37.5 C (99.5 F)). 09/12/17  Yes Wieting, Richard, MD  ALPRAZolam Prudy Feeler) 0.25 MG tablet Take 1 tablet (0.25 mg total) by mouth at bedtime as needed for sleep. 09/12/17   Alford Highland, MD  amLODipine (NORVASC) 5 MG tablet Take 5 mg by mouth daily. 09/26/17   [provider]  aspirin EC 81 MG EC tablet Take 1 tablet (81 mg total) by mouth daily. Patient not taking: Reported on 09/30/2017 09/12/17   Alford Highland, MD  atorvastatin (LIPITOR) 40 MG tablet Take 1 tablet (40 mg total) by mouth daily at 6 PM. Patient not taking: Reported on 09/30/2017 09/12/17   Alford Highland, MD  brinzolamide (AZOPT) 1 % ophthalmic suspension Place 1 drop into both eyes 2 (two) times daily.    [provider]  Cholecalciferol (VITAMIN D3) 2000 units capsule Take 2,000 Units by mouth daily.    [provider]  clopidogrel (PLAVIX) 75 MG tablet Take 1 tablet (75 mg total) by mouth daily. Patient not taking: Reported on 09/30/2017 09/12/17   Alford Highland, MD  Cyanocobalamin (B-12) 500 MCG TABS Take 500 mcg by mouth 2 (two) times daily. 10/14/14   [provider]  diphenhydrAMINE (BENADRYL) 25 mg capsule Take 1 capsule (25 mg total) by mouth every 8 (eight) hours as needed for sleep. 09/30/17   Minna Antis, MD  dipyridamole-aspirin (AGGRENOX) 200-25 MG 12hr capsule Take 1 capsule by mouth 2 (two) times daily. 09/26/17   [provider]  Fe Fum-FePoly-FA-Vit C-Vit B3 (INTEGRA F) 125-1 MG CAPS Take 1 capsule by mouth daily. 10/24/17   [provider]  gabapentin (NEURONTIN) 100 MG capsule Take 100 mg by mouth at bedtime.  [provider]  glipiZIDE (GLUCOTROL XL) 5 MG 24 hr tablet Take 1 tablet by mouth daily. 09/26/17   [provider]  Ipratropium-Albuterol (COMBIVENT RESPIMAT) 20-100 MCG/ACT AERS respimat Inhale 1 puff into the lungs every 6 (six) hours. 09/18/16   Katharina Caper, MD  latanoprost (XALATAN) 0.005 % ophthalmic solution Place 1 drop into both eyes at bedtime.    [provider]  lisinopril (PRINIVIL,ZESTRIL) 20 MG tablet Take 20 mg by mouth daily. 09/26/17   [provider]  metFORMIN  (GLUCOPHAGE) 500 MG tablet Take 500 mg by mouth 2 (two) times daily.    [provider]  metoprolol succinate (TOPROL-XL) 50 MG 24 hr tablet Take 1 tablet by mouth daily. 09/26/17   [provider]  nystatin (MYCOSTATIN/NYSTOP) powder Apply topically 2 (two) times daily. Groin area and areas of erythema 09/13/17   Alford Highland, MD  omeprazole (PRILOSEC) 20 MG capsule Take 20 mg by mouth daily.    [provider]  PARoxetine (PAXIL) 20 MG tablet Take 20 mg by mouth daily.    [provider]  polyethylene glycol (MIRALAX / GLYCOLAX) packet Take 17 g by mouth daily. 09/12/17   Alford Highland, MD  POLYSACCHARIDE IRON COMPLEX PO Take 1 capsule by mouth daily.    [provider]  simvastatin (ZOCOR) 20 MG tablet Take 20 mg by mouth daily. 09/26/17   [provider]  Vitamin D, Ergocalciferol, (DRISDOL) 50000 units CAPS capsule Take 1 capsule by mouth once a week.    [provider]    Physical Exam: Vitals:   01/01/18 2315 01/02/18 0030  BP: (!) 127/57 (!) 124/59  Pulse: 75 77  Resp: 17 17  SpO2: 97% 100%      Constitutional: NAD, calm, comfortable Vitals:   01/01/18 2315 01/02/18 0030  BP: (!) 127/57 (!) 124/59  Pulse: 75 77  Resp: 17 17  SpO2: 97% 100%   Eyes: PERRL, lids and conjunctivae normal ENMT: Mucous membranes are moist. Posterior pharynx clear of any exudate or lesions.Normal dentition.  Neck: normal, supple, no masses, no thyromegaly Respiratory: clear to auscultation bilaterally, no wheezing, no crackles. Normal respiratory effort. No accessory muscle use.  Cardiovascular: Regular rate and rhythm, no murmurs / rubs / gallops. No extremity edema. 2+ pedal pulses. No carotid bruits.  Abdomen: no tenderness, no masses palpated. No hepatosplenomegaly. Bowel sounds positive.  Musculoskeletal: no clubbing / cyanosis. No joint deformity upper and lower extremities. Good ROM, no contractures. Normal muscle tone.    Skin: no rashes, lesions, ulcers. No induration Neurologic: residual left-sided weakness from previous CVA Psychiatric: Normal judgment and insight. Alert and oriented x 3. Normal mood.   Labs on Admission: I have personally reviewed following labs and imaging studies  CBC: Recent Labs  Lab 01/01/18 2108 01/01/18 2118  WBC 6.1  --   NEUTROABS 2.2  --   HGB 10.1* 9.9*  HCT 31.1* 29.0*  MCV 92.8  --   PLT 315  --    Basic Metabolic Panel: Recent Labs  Lab 01/01/18 2108 01/01/18 2118  NA 141 142  K 3.4* 3.4*  CL 105 103  CO2 25  --   GLUCOSE 122* 123*  BUN 20 20  CREATININE 1.21* 1.20*  CALCIUM 9.3  --    GFR: CrCl cannot be calculated (Unknown ideal weight.). Liver Function Tests: Recent Labs  Lab 01/01/18 2108  AST 20  ALT 15  ALKPHOS 53  BILITOT 0.5  PROT 7.2  ALBUMIN 3.6  No results for input(s): LIPASE, AMYLASE in the last 168 hours. No results for input(s): AMMONIA in the last 168 hours. Coagulation Profile: Recent Labs  Lab 01/01/18 2108  INR 1.03   Cardiac Enzymes: No results for input(s): CKTOTAL, CKMB, CKMBINDEX, TROPONINI in the last 168 hours. BNP (last 3 results) No results for input(s): PROBNP in the last 8760 hours. HbA1C: No results for input(s): HGBA1C in the last 72 hours. CBG: No results for input(s): GLUCAP in the last 168 hours. Lipid Profile: No results for input(s): CHOL, HDL, LDLCALC, TRIG, CHOLHDL, LDLDIRECT in the last 72 hours. Thyroid Function Tests: No results for input(s): TSH, T4TOTAL, FREET4, T3FREE, THYROIDAB in the last 72 hours. Anemia Panel: No results for input(s): VITAMINB12, FOLATE, FERRITIN, TIBC, IRON, RETICCTPCT in the last 72 hours. Urine analysis:    Component Value Date/Time   COLORURINE YELLOW 01/01/2018 2333   APPEARANCEUR HAZY (A) 01/01/2018 2333   APPEARANCEUR Cloudy 08/27/2012 1210   LABSPEC 1.015 01/01/2018 2333   LABSPEC 1.008 08/27/2012 1210   PHURINE 5.0 01/01/2018 2333   GLUCOSEU  NEGATIVE 01/01/2018 2333   GLUCOSEU Negative 08/27/2012 1210   HGBUR NEGATIVE 01/01/2018 2333   BILIRUBINUR NEGATIVE 01/01/2018 2333   BILIRUBINUR Negative 08/27/2012 1210   KETONESUR NEGATIVE 01/01/2018 2333   PROTEINUR NEGATIVE 01/01/2018 2333   NITRITE POSITIVE (A) 01/01/2018 2333   LEUKOCYTESUR MODERATE (A) 01/01/2018 2333   LEUKOCYTESUR Negative 08/27/2012 1210   Sepsis Labs: @LABRCNTIP (procalcitonin:4,lacticidven:4) )No results found for this or any previous visit (from the past 240 hour(s)).   Radiological Exams on Admission: Dg Chest 2 View  Result Date: 01/01/2018 CLINICAL DATA:  Recrudesence of neuro symptoms. Patient unable to lift lt arm EXAM: CHEST - 2 VIEW COMPARISON:  09/08/2017 FINDINGS: Left pacer remains in place, unchanged. Mild cardiomegaly. Low lung volumes with bibasilar atelectasis. No effusions or edema. No acute bony abnormality. IMPRESSION: Low lung volumes, bibasilar atelectasis. Electronically Signed   By: Charlett Nose M.D.   On: 01/01/2018 23:28   Ct Head Code Stroke Wo Contrast  Result Date: 01/01/2018 CLINICAL DATA:  Code stroke. LEFT-sided weakness and facial droop, aphasia and blindness. Last seen normal at 1955 hours. Assess stroke. History of strokes, pacemaker, diabetes and dementia. EXAM: CT HEAD WITHOUT CONTRAST TECHNIQUE: Contiguous axial images were obtained from the base of the skull through the vertex without intravenous contrast. COMPARISON:  CT HEAD September 30, 2017 FINDINGS: BRAIN: No intraparenchymal hemorrhage, mass effect nor midline shift. Moderate to severe parenchymal brain volume loss. Confluent supratentorial white matter hypodensities. Old basal ganglia lacunar infarcts. No acute large vascular territory infarcts. No abnormal extra-axial fluid collections. Basal cisterns are patent. VASCULAR: Severe calcific atherosclerosis of the carotid siphons. SKULL: No skull fracture. Severe LEFT temporomandibular osteoarthrosis. No significant scalp  soft tissue swelling. SINUSES/ORBITS: The mastoid air-cells and included paranasal sinuses are well-aerated.The included ocular globes and orbital contents are non-suspicious. Status post bilateral ocular lens implants. OTHER: None. ASPECTS [facility] Stroke Program Early CT Score) - Ganglionic level infarction (caudate, lentiform nuclei, internal capsule, insula, M1-M3 cortex): 7 - Supraganglionic infarction (M4-M6 cortex): 3 Total score (0-10 with 10 being normal): 10 IMPRESSION: 1. No acute intracranial process 2. ASPECTS is 10. 3. Moderate to severe chronic small vessel ischemic disease and old basal ganglia lacunar infarcts. 4. Moderate to severe parenchymal brain volume loss. 5. Critical Value/emergent results text paged to Dr.MCNEILL [facility] via AMION secure system on 01/01/2018 at 8:35 pm, including interpreting physician's phone number. Electronically Signed   By: Awilda Metro  M.D.   On: 01/01/2018 20:39    EKG: Independently reviewed. Ventricular paced rhythm with a rate of 77  Assessment/Plan Principal Problem:   Presyncope Active Problems:   Diabetes (HCC)   History of stroke   UTI (urinary tract infection)   Dehydration   HTN (hypertension)   Hyperlipidemia   Postural dizziness with presyncope   Hypokalemia    #1 presyncope: Most likely due to dehydration. This may be secondary to the UTI. I agree with neurology this does not appear to be stroke related. We'll hydrate patient, get PT and OT consultation. Check orthostatics  #2 dementia: Patient is currently at baseline. Not a good historian for that reason.  #3 UTI: She may have had prior low. Nitrite is also positive. Empirically start on Rocephin and await urine culture and sensitivity results.  #4 history of CVA: Patient's last residual defects from her CVA. The UTI may have exacerbated the symptoms. Continue current treatment with aspirin and statin  #5 hypertension: Blood pressure appears controlled. Continue  current regimen  #6 hyperlipidemia:Continue statin  #7 diabetes. We will hold metformin and start sliding scale insulin.   DVT prophylaxis: Lovenox  Code Status: Full   Family Communication: Son and other family members at bedside   Disposition Plan: To be determined   Consults called: Dr Onalee HuaKirkpatrick, Mcneil, Neurology Admission status: observation to telemetry   Severity of Illness: The appropriate patient status for this patient is OBSERVATION. Observation status is judged to be reasonable and necessary in order to provide the required intensity of service to ensure the patient's safety. The patient's presenting symptoms, physical exam findings, and initial radiographic and laboratory data in the context of their medical condition is felt to place them at decreased risk for further clinical deterioration. Furthermore, it is anticipated that the patient will be medically stable for discharge from the hospital within 2 midnights of admission. The following factors support the patient status of observation.   " The patient's presenting symptoms include passing out. " The physical exam findings include evidence of dehydration and UTI. " The initial radiographic and laboratory data are urinalysis showing evidence ofUTI.     Lonia BloodGARBA,LAWAL MD [facility] Pager [phone]  If 7PM-7AM, please contact night-coverage [email or web address] Password [email or web address]  01/02/2018, 1:04 AM

## 2018-01-02 NOTE — Progress Notes (Signed)
Patient admitted after midnight, please see H&P.  Appears to have a UTI-- culture pending, treating with IV abx.  Patient did well with PT Eval.  Home in AM with home health and 24 hour care giving. Blood sugars elevated-- will resume metformin--- monitor blood sugars closely  Daughter trying to get patient in with [facility] program.    Jessica CanaryJessica Rhiley Tarver DO

## 2018-01-03 DIAGNOSIS — E876 Hypokalemia: Secondary | ICD-10-CM

## 2018-01-03 DIAGNOSIS — N3000 Acute cystitis without hematuria: Secondary | ICD-10-CM | POA: Diagnosis not present

## 2018-01-03 DIAGNOSIS — R42 Dizziness and giddiness: Secondary | ICD-10-CM | POA: Diagnosis not present

## 2018-01-03 DIAGNOSIS — E1159 Type 2 diabetes mellitus with other circulatory complications: Secondary | ICD-10-CM | POA: Diagnosis not present

## 2018-01-03 DIAGNOSIS — R55 Syncope and collapse: Secondary | ICD-10-CM | POA: Diagnosis not present

## 2018-01-03 LAB — BASIC METABOLIC PANEL
Anion gap: 10 (ref 5–15)
BUN: 10 mg/dL (ref 6–20)
CO2: 25 mmol/L (ref 22–32)
Calcium: 8.9 mg/dL (ref 8.9–10.3)
Chloride: 108 mmol/L (ref 101–111)
Creatinine, Ser: 1.13 mg/dL — ABNORMAL HIGH (ref 0.44–1.00)
GFR calc Af Amer: 50 mL/min — ABNORMAL LOW (ref >60)
GFR calc non Af Amer: 43 mL/min — ABNORMAL LOW (ref >60)
Glucose, Bld: 67 mg/dL (ref 65–99)
Potassium: 3.5 mmol/L (ref 3.5–5.1)
Sodium: 143 mmol/L (ref 135–145)

## 2018-01-03 LAB — GLUCOSE, CAPILLARY
GLUCOSE-CAPILLARY: 102 mg/dL — AB (ref 65–99)
Glucose-Capillary: 81 mg/dL (ref 65–99)

## 2018-01-03 MED ORDER — CEPHALEXIN 500 MG PO CAPS: 500.0000 mg | ORAL_CAPSULE | Freq: Two times a day (BID) | ORAL | 0 refills | Status: DC

## 2018-01-03 MED ORDER — CEPHALEXIN 500 MG PO CAPS: 500.0000 mg | ORAL_CAPSULE | Freq: Two times a day (BID) | ORAL | Status: DC

## 2018-01-03 NOTE — Progress Notes (Signed)
Sheppard PlumberArlania L Gardner to be D/C'd home per MD order. Discussed with the patient and patient's daughter and all questions fully answered. VVS, Skin clean, dry and intact without evidence of skin break down, no evidence of skin tears noted.  IV catheters discontinued intact. Site without signs and symptoms of complications. Dressing and pressure applied.  An After Visit Summary was printed and given to the patient.  Patient escorted via WC, and D/C home via private auto.  [name]  01/03/2018 2:41 PM

## 2018-01-03 NOTE — Discharge Summary (Signed)
Physician Discharge Summary  BECKI MCCASKILL ONG:[id number] DOB: 1930-06-15 DOA: 01/01/2018  PCP: Barbette Reichmann, MD  Admit date: 01/01/2018 Discharge date: 01/03/2018   Recommendations for Outpatient Follow-Up:   1. Family looking into PACE program vs palliative care/hospice 2. Treating UTI-- final cultures pending-- klebsiella thus far 3. Home health   Discharge Diagnosis:   Principal Problem:   Postural dizziness with presyncope Active Problems:   Diabetes (HCC)   History of stroke   Presyncope   UTI (urinary tract infection)   Dehydration   HTN (hypertension)   Hyperlipidemia   Hypokalemia   Discharge disposition:  Home  Discharge Condition: Improved.  Diet recommendation: Low sodium, heart healthy.  Carbohydrate-modified  Wound care: None.   History of Present Illness:   Jessica Gardner is a 84 y.o. female with medical history significant of previous CVA with dementia, diabetes left-sided residual deficits hypertension and hyperlipidemia who was brought in from home after blacking out. This was followed by generalized weakness, difficulty with speech and left-sided facial droop which are worsened from her baseline. No fever or chills. Patient is a poor historian.     Hospital Course by Problem:   UTI- klebsiella -final cultures pending -keflex  Dementia -stable -has 24 hour care by family  H/o CVA -on statin -continue agrenox -neuro follow up as an outpatient-- was seen by neurology here  HTN  -stable  DM with circulatory complications -resume home meds  PT-- home health   Medical Consultants:    Neuro   Discharge Exam:   Vitals:   01/02/18 2045 01/03/18 0522  BP: (!) 109/48 137/60  Pulse: 62 78  Resp: 16 16  Temp: 98 F (36.7 C) 98.4 F (36.9 C)  SpO2: 98% 98%   Vitals:   01/02/18 2000 01/02/18 2045 01/02/18 2118 01/03/18 0522  BP: (!) 102/47 (!) 109/48  137/60  Pulse: 70 62  78  Resp: 12 16  16   Temp:  98 F  (36.7 C)  98.4 F (36.9 C)  TempSrc:  Oral  Oral  SpO2: 98% 98%  98%  Weight:   100.8 kg (222 lb 3.6 oz)   Height:   5\' 5"  (1.651 m)     Gen:  NAD   The results of significant diagnostics from this hospitalization (including imaging, microbiology, ancillary and laboratory) are listed below for reference.     Procedures and Diagnostic Studies:   Dg Chest 2 View  Result Date: 01/01/2018 CLINICAL DATA:  Recrudesence of neuro symptoms. Patient unable to lift lt arm EXAM: CHEST - 2 VIEW COMPARISON:  09/08/2017 FINDINGS: Left pacer remains in place, unchanged. Mild cardiomegaly. Low lung volumes with bibasilar atelectasis. No effusions or edema. No acute bony abnormality. IMPRESSION: Low lung volumes, bibasilar atelectasis. Electronically Signed   By: Charlett Nose M.D.   On: 01/01/2018 23:28   Ct Head Code Stroke Wo Contrast  Result Date: 01/01/2018 CLINICAL DATA:  Code stroke. LEFT-sided weakness and facial droop, aphasia and blindness. Last seen normal at 1955 hours. Assess stroke. History of strokes, pacemaker, diabetes and dementia. EXAM: CT HEAD WITHOUT CONTRAST TECHNIQUE: Contiguous axial images were obtained from the base of the skull through the vertex without intravenous contrast. COMPARISON:  CT HEAD September 30, 2017 FINDINGS: BRAIN: No intraparenchymal hemorrhage, mass effect nor midline shift. Moderate to severe parenchymal brain volume loss. Confluent supratentorial white matter hypodensities. Old basal ganglia lacunar infarcts. No acute large vascular territory infarcts. No abnormal extra-axial fluid collections. Basal cisterns are patent. VASCULAR:  Severe calcific atherosclerosis of the carotid siphons. SKULL: No skull fracture. Severe LEFT temporomandibular osteoarthrosis. No significant scalp soft tissue swelling. SINUSES/ORBITS: The mastoid air-cells and included paranasal sinuses are well-aerated.The included ocular globes and orbital contents are non-suspicious. Status post  bilateral ocular lens implants. OTHER: None. ASPECTS [facility]([place] Stroke Program Early CT Score) - Ganglionic level infarction (caudate, lentiform nuclei, internal capsule, insula, M1-M3 cortex): 7 - Supraganglionic infarction (M4-M6 cortex): 3 Total score (0-10 with 10 being normal): 10 IMPRESSION: 1. No acute intracranial process 2. ASPECTS is 10. 3. Moderate to severe chronic small vessel ischemic disease and old basal ganglia lacunar infarcts. 4. Moderate to severe parenchymal brain volume loss. 5. Critical Value/emergent results text paged to [facility] via [employer] secure system on 01/01/2018 at 8:35 pm, including interpreting physician's phone number. Electronically Signed   By: Awilda Metroourtnay  Bloomer M.D.   On: 01/01/2018 20:39     Labs:   Basic Metabolic Panel: Recent Labs  Lab 01/01/18 2108 01/01/18 2118 01/02/18 0253 01/03/18 0355  NA 141 142 138 143  K 3.4* 3.4* 3.7 3.5  CL 105 103 103 108  CO2 25  --  24 25  GLUCOSE 122* 123* 84 67  BUN 20 20 18 10   CREATININE 1.21* 1.20* 1.17* 1.13*  CALCIUM 9.3  --  9.3 8.9   GFR Estimated Creatinine Clearance: 42.8 mL/min (A) (by C-G formula based on SCr of 1.13 mg/dL (H)). Liver Function Tests: Recent Labs  Lab 01/01/18 2108 01/02/18 0253  AST 20 19  ALT 15 16  ALKPHOS 53 51  BILITOT 0.5 0.4  PROT 7.2 6.8  ALBUMIN 3.6 3.5   No results for input(s): LIPASE, AMYLASE in the last 168 hours. No results for input(s): AMMONIA in the last 168 hours. Coagulation profile Recent Labs  Lab 01/01/18 2108  INR 1.03    CBC: Recent Labs  Lab 01/01/18 2108 01/01/18 2118 01/02/18 0253  WBC 6.1  --  6.1  NEUTROABS 2.2  --   --   HGB 10.1* 9.9* 10.2*  HCT 31.1* 29.0* 31.2*  MCV 92.8  --  93.7  PLT 315  --  326   Cardiac Enzymes: No results for input(s): CKTOTAL, CKMB, CKMBINDEX, TROPONINI in the last 168 hours. BNP: Invalid input(s): POCBNP CBG: Recent Labs  Lab 01/02/18 0741 01/02/18 1230 01/02/18 1731 01/03/18 0755    GLUCAP 72 225* 133* 81   D-Dimer No results for input(s): DDIMER in the last 72 hours. Hgb A1c Recent Labs    01/02/18 0253  HGBA1C 5.6   Lipid Profile No results for input(s): CHOL, HDL, LDLCALC, TRIG, CHOLHDL, LDLDIRECT in the last 72 hours. Thyroid function studies Recent Labs    01/02/18 0253  TSH 2.948   Anemia work up No results for input(s): VITAMINB12, FOLATE, FERRITIN, TIBC, IRON, RETICCTPCT in the last 72 hours. Microbiology Recent Results (from the past 240 hour(s))  Urine culture     Status: Abnormal (Preliminary result)   Collection Time: 01/01/18 11:33 PM  Result Value Ref Range Status   Specimen Description URINE, CATHETERIZED  Final   Special Requests NONE  Final   Culture (A)  Final    >=100,000 COLONIES/mL KLEBSIELLA PNEUMONIAE SUSCEPTIBILITIES TO FOLLOW Performed at [facility], [address] [place], [place] [id number]    Report Status PENDING  Incomplete     Discharge Instructions:   Discharge Instructions    Diet - low sodium heart healthy   Complete by:  As directed  Diet Carb Modified   Complete by:  As directed    Discharge instructions   Complete by:  As directed    Home health and 24 hour care   Increase activity slowly   Complete by:  As directed      Allergies as of 01/03/2018   No Known Allergies     Medication List    TAKE these medications   acetaminophen 325 MG tablet Commonly known as:  TYLENOL Take 2 tablets (650 mg total) by mouth every 4 (four) hours as needed for mild pain (or temp > 37.5 C (99.5 F)).   amLODipine 5 MG tablet Commonly known as:  NORVASC Take 5 mg by mouth daily.   bimatoprost 0.01 % Soln Commonly known as:  LUMIGAN Place 1 drop into both eyes 2 (two) times daily.   cephALEXin 500 MG capsule Commonly known as:  KEFLEX Take 1 capsule (500 mg total) by mouth every 12 (twelve) hours.   dipyridamole-aspirin 200-25 MG 12hr capsule Commonly known as:  AGGRENOX Take 1 capsule by mouth  2 (two) times daily.   gabapentin 100 MG capsule Commonly known as:  NEURONTIN Take 100 mg by mouth at bedtime.   glipiZIDE 5 MG 24 hr tablet Commonly known as:  GLUCOTROL XL Take 1 tablet by mouth daily.   INTEGRA F 125-1 MG Caps Take 1 capsule by mouth daily.   Ipratropium-Albuterol 20-100 MCG/ACT Aers respimat Commonly known as:  COMBIVENT RESPIMAT Inhale 1 puff into the lungs every 6 (six) hours.   latanoprost 0.005 % ophthalmic solution Commonly known as:  XALATAN Place 1 drop into both eyes at bedtime.   lisinopril 20 MG tablet Commonly known as:  PRINIVIL,ZESTRIL Take 20 mg by mouth daily.   metFORMIN 500 MG tablet Commonly known as:  GLUCOPHAGE Take 500 mg by mouth 2 (two) times daily.   metoprolol succinate 50 MG 24 hr tablet Commonly known as:  TOPROL-XL Take 1 tablet by mouth daily.   omeprazole 20 MG capsule Commonly known as:  PRILOSEC Take 20 mg by mouth daily.   PARoxetine 20 MG tablet Commonly known as:  PAXIL Take 20 mg by mouth daily.   polyethylene glycol packet Commonly known as:  MIRALAX / GLYCOLAX Take 17 g by mouth daily.   simvastatin 20 MG tablet Commonly known as:  ZOCOR Take 20 mg by mouth daily.   Vitamin D (Ergocalciferol) 50000 units Caps capsule Commonly known as:  DRISDOL Take 50,000 Units by mouth every 7 (seven) days.      Follow-up Information    Barbette Reichmann, MD Follow up in 1 week(s).   Specialty:  Internal Medicine Contact information: [facility]- Internal Medicine [address] [place] [address] [phone]            Time coordinating discharge: 35 min  Signed:  Joseph Art   [facility] 01/03/2018, 11:32 AM

## 2018-01-04 LAB — URINE CULTURE

## 2018-02-15 ENCOUNTER — Other Ambulatory Visit: Payer: Self-pay | Admitting: Internal Medicine

## 2018-02-15 DIAGNOSIS — Z8673 Personal history of transient ischemic attack (TIA), and cerebral infarction without residual deficits: Secondary | ICD-10-CM

## 2018-02-15 DIAGNOSIS — I10 Essential (primary) hypertension: Secondary | ICD-10-CM

## 2018-02-15 DIAGNOSIS — R262 Difficulty in walking, not elsewhere classified: Secondary | ICD-10-CM

## 2018-02-15 DIAGNOSIS — R531 Weakness: Secondary | ICD-10-CM

## 2018-02-15 DIAGNOSIS — D519 Vitamin B12 deficiency anemia, unspecified: Secondary | ICD-10-CM

## 2018-02-16 ENCOUNTER — Other Ambulatory Visit: Payer: Self-pay | Admitting: Internal Medicine

## 2018-02-16 DIAGNOSIS — I63119 Cerebral infarction due to embolism of unspecified vertebral artery: Secondary | ICD-10-CM

## 2018-02-21 ENCOUNTER — Ambulatory Visit
Admission: RE | Admit: 2018-02-21 | Discharge: 2018-02-21 | Disposition: A | Discharge status: Home / Self Care | Payer: Medicare Other | Source: Ambulatory Visit | Attending: Internal Medicine | Admitting: Internal Medicine

## 2018-02-21 DIAGNOSIS — R262 Difficulty in walking, not elsewhere classified: Secondary | ICD-10-CM | POA: Diagnosis present

## 2018-02-21 DIAGNOSIS — M79602 Pain in left arm: Secondary | ICD-10-CM | POA: Diagnosis not present

## 2018-02-21 DIAGNOSIS — I63119 Cerebral infarction due to embolism of unspecified vertebral artery: Secondary | ICD-10-CM

## 2018-02-21 DIAGNOSIS — Z9181 History of falling: Secondary | ICD-10-CM | POA: Diagnosis not present

## 2018-02-21 DIAGNOSIS — G319 Degenerative disease of nervous system, unspecified: Secondary | ICD-10-CM | POA: Diagnosis not present

## 2018-02-21 DIAGNOSIS — Z8673 Personal history of transient ischemic attack (TIA), and cerebral infarction without residual deficits: Secondary | ICD-10-CM | POA: Insufficient documentation

## 2018-02-21 DIAGNOSIS — R531 Weakness: Secondary | ICD-10-CM | POA: Insufficient documentation

## 2018-05-18 ENCOUNTER — Other Ambulatory Visit: Payer: Self-pay

## 2018-05-18 ENCOUNTER — Emergency Department: Payer: Medicare Other

## 2018-05-18 ENCOUNTER — Emergency Department
Admission: EM | Admit: 2018-05-18 | Discharge: 2018-05-18 | Disposition: A | Discharge status: Home / Self Care | Payer: Medicare Other | Attending: Student in an Organized Health Care Education/Training Program | Admitting: Student in an Organized Health Care Education/Training Program

## 2018-05-18 ENCOUNTER — Encounter: Payer: Self-pay | Admitting: Emergency Medicine

## 2018-05-18 DIAGNOSIS — R42 Dizziness and giddiness: Secondary | ICD-10-CM | POA: Insufficient documentation

## 2018-05-18 DIAGNOSIS — E119 Type 2 diabetes mellitus without complications: Secondary | ICD-10-CM | POA: Insufficient documentation

## 2018-05-18 DIAGNOSIS — Z950 Presence of cardiac pacemaker: Secondary | ICD-10-CM | POA: Insufficient documentation

## 2018-05-18 DIAGNOSIS — I10 Essential (primary) hypertension: Secondary | ICD-10-CM | POA: Diagnosis not present

## 2018-05-18 DIAGNOSIS — Z79899 Other long term (current) drug therapy: Secondary | ICD-10-CM | POA: Diagnosis not present

## 2018-05-18 DIAGNOSIS — Z7984 Long term (current) use of oral hypoglycemic drugs: Secondary | ICD-10-CM | POA: Insufficient documentation

## 2018-05-18 DIAGNOSIS — F0390 Unspecified dementia without behavioral disturbance: Secondary | ICD-10-CM | POA: Diagnosis not present

## 2018-05-18 LAB — CBC
HEMATOCRIT: 30 % — AB (ref 35.0–47.0)
Hemoglobin: 10.7 g/dL — ABNORMAL LOW (ref 12.0–16.0)
MCH: 32.9 pg (ref 26.0–34.0)
MCHC: 35.6 g/dL (ref 32.0–36.0)
MCV: 92.6 fL (ref 80.0–100.0)
Platelets: 354 10*3/uL (ref 150–440)
RBC: 3.24 MIL/uL — AB (ref 3.80–5.20)
RDW: 12.2 % (ref 11.5–14.5)
WBC: 6 10*3/uL (ref 3.6–11.0)

## 2018-05-18 LAB — BASIC METABOLIC PANEL
Anion gap: 6 (ref 5–15)
BUN: 21 mg/dL (ref 8–23)
CO2: 27 mmol/L (ref 22–32)
Calcium: 9.5 mg/dL (ref 8.9–10.3)
Chloride: 110 mmol/L (ref 98–111)
Creatinine, Ser: 1.04 mg/dL — ABNORMAL HIGH (ref 0.44–1.00)
GFR calc non Af Amer: 47 mL/min — ABNORMAL LOW (ref >60)
GFR, EST AFRICAN AMERICAN: 55 mL/min — AB (ref >60)
Glucose, Bld: 101 mg/dL — ABNORMAL HIGH (ref 70–99)
POTASSIUM: 4.1 mmol/L (ref 3.5–5.1)
SODIUM: 143 mmol/L (ref 135–145)

## 2018-05-18 LAB — URINALYSIS, COMPLETE (UACMP) WITH MICROSCOPIC
BILIRUBIN URINE: NEGATIVE
Glucose, UA: NEGATIVE mg/dL
Hgb urine dipstick: NEGATIVE
Ketones, ur: NEGATIVE mg/dL
LEUKOCYTES UA: NEGATIVE
Nitrite: NEGATIVE
PROTEIN: NEGATIVE mg/dL
Specific Gravity, Urine: 1.018 (ref 1.005–1.030)
pH: 5 (ref 5.0–8.0)

## 2018-05-18 LAB — TROPONIN I

## 2018-05-18 MED ORDER — CEPHALEXIN 500 MG PO CAPS: 500.0000 mg | ORAL_CAPSULE | Freq: Three times a day (TID) | ORAL | 0 refills | Status: AC

## 2018-05-18 MED ORDER — CEPHALEXIN 500 MG PO CAPS
500.0000 mg | ORAL_CAPSULE | Freq: Once | ORAL | Status: AC
  Administered 2018-05-18: 500 mg via ORAL
  Filled 2018-05-18: qty 1

## 2018-05-18 MED ORDER — SODIUM CHLORIDE 0.9 % IV BOLUS
500.0000 mL | Freq: Once | INTRAVENOUS | Status: AC
  Administered 2018-05-18: 500 mL via INTRAVENOUS

## 2018-05-18 MED ORDER — FLUCONAZOLE 150 MG PO TABS: 150.0000 mg | ORAL_TABLET | Freq: Every day | ORAL | 0 refills | Status: DC

## 2018-05-18 NOTE — ED Notes (Signed)
Patient discharged to home per MD order. Patient in stable condition, and deemed medically cleared by ED provider for discharge. Discharge instructions reviewed with patient/family using "Teach Back"; verbalized understanding of medication education and administration, and information about follow-up care. Denies further concerns. ° °

## 2018-05-18 NOTE — ED Provider Notes (Signed)
[facility] Emergency Department Provider Note    First MD Initiated Contact with Patient 05/18/18 1639     (approximate)  I have reviewed the triage vital signs and the nursing notes.   HISTORY  Chief Complaint Dizziness    HPI Jessica Gardner is a 84 y.o. female history of dementia, diabetes, MI and stroke presents the ER for lightheadedness that started this morning.  Was brought to the ER at the direction of her caregiver.  She is currently accompanied with her 2 daughters.  States that she has had similar episodes like this in the past when she was diagnosed with a urinary tract infection.  She denies any chest pain or shortness of breath.  Has had a cough.  Denies any numbness or tingling.  She is nonambulatory and use a is a wheelchair.  Does feel more lightheaded after sitting up.  Has reportedly had good appetite.  Denies any abdominal pain.  No diarrhea.    Past Medical History:  Diagnosis Date  . Dementia   . Diabetes mellitus   . MI (myocardial infarction) (HCC)   . Pacemaker   . Stroke [facility])    Family History  Problem Relation Age of Onset  . Breast cancer Sister 77   Past Surgical History:  Procedure Laterality Date  . PACEMAKER INSERTION     Patient Active Problem List   Diagnosis Date Noted  . Presyncope 01/02/2018  . UTI (urinary tract infection) 01/02/2018  . Dehydration 01/02/2018  . HTN (hypertension) 01/02/2018  . Hyperlipidemia 01/02/2018  . Postural dizziness with presyncope 01/02/2018  . Hypokalemia 01/02/2018  . Left leg weakness 09/09/2017  . Diabetes (HCC) 09/09/2017  . Dementia 09/09/2017  . History of stroke 09/09/2017  . Acute bronchitis 09/18/2016  . Elevated troponin 09/18/2016  . Anemia 09/18/2016  . Renal insufficiency 09/18/2016  . Generalized weakness 09/18/2016  . Chest pain 09/17/2016      Prior to Admission medications   Medication Sig Start Date End Date Taking? Authorizing Provider    acetaminophen (TYLENOL) 325 MG tablet Take 2 tablets (650 mg total) by mouth every 4 (four) hours as needed for mild pain (or temp > 37.5 C (99.5 F)). 09/12/17  Yes Wieting, Richard, MD  amLODipine (NORVASC) 5 MG tablet Take 5 mg by mouth daily. 09/26/17  Yes [provider]  bimatoprost (LUMIGAN) 0.01 % SOLN Place 1 drop into both eyes 2 (two) times daily.   Yes [provider]  dipyridamole-aspirin (AGGRENOX) 200-25 MG 12hr capsule Take 1 capsule by mouth 2 (two) times daily. 09/26/17  Yes [provider]  Fe Fum-FePoly-FA-Vit C-Vit B3 (INTEGRA F) 125-1 MG CAPS Take 1 capsule by mouth daily. 10/24/17  Yes [provider]  gabapentin (NEURONTIN) 100 MG capsule Take 100 mg by mouth at bedtime.   Yes [provider]  glipiZIDE (GLUCOTROL XL) 5 MG 24 hr tablet Take 1 tablet by mouth daily. 09/26/17  Yes [provider]  Ipratropium-Albuterol (COMBIVENT RESPIMAT) 20-100 MCG/ACT AERS respimat Inhale 1 puff into the lungs every 6 (six) hours. 09/18/16  Yes Katharina Caper, MD  latanoprost (XALATAN) 0.005 % ophthalmic solution Place 1 drop into both eyes at bedtime.   Yes [provider]  lisinopril (PRINIVIL,ZESTRIL) 20 MG tablet Take 20 mg by mouth daily. 09/26/17  Yes [provider]  metFORMIN (GLUCOPHAGE) 500 MG tablet Take 500 mg by mouth 2 (two) times daily.   Yes [provider]  metoprolol succinate (TOPROL-XL) 50  MG 24 hr tablet Take 1 tablet by mouth daily. 09/26/17  Yes [provider]  omeprazole (PRILOSEC) 20 MG capsule Take 20 mg by mouth daily.   Yes [provider]  PARoxetine (PAXIL) 20 MG tablet Take 20 mg by mouth daily.   Yes [provider]  simvastatin (ZOCOR) 20 MG tablet Take 20 mg by mouth daily. 09/26/17  Yes [provider]  Vitamin D, Ergocalciferol, (DRISDOL) 50000 units CAPS capsule Take 50,000 Units by mouth every 7 (seven) days.   Yes [provider]   cephALEXin (KEFLEX) 500 MG capsule Take 1 capsule (500 mg total) by mouth every 12 (twelve) hours. Patient not taking: Reported on 05/18/2018 01/03/18   Joseph Art, DO  cephALEXin (KEFLEX) 500 MG capsule Take 1 capsule (500 mg total) by mouth 3 (three) times daily for 5 days. 05/18/18 05/23/18  Willy Eddy, MD  fluconazole (DIFLUCAN) 150 MG tablet Take 1 tablet (150 mg total) by mouth daily. 05/18/18   Willy Eddy, MD    Allergies Patient has no known allergies.    Social History Social History   Tobacco Use  . Smoking status: Never Smoker  . Smokeless tobacco: Never Used  Substance Use Topics  . Alcohol use: No  . Drug use: No    Review of Systems Patient denies headaches, rhinorrhea, blurry vision, numbness, shortness of breath, chest pain, edema, cough, abdominal pain, nausea, vomiting, diarrhea, dysuria, fevers, rashes or hallucinations unless otherwise stated above in HPI. ____________________________________________   PHYSICAL EXAM:  VITAL SIGNS: Vitals:   05/18/18 1915 05/18/18 1936  BP:  (!) 136/56  Pulse:  63  Resp: 14 20  Temp:    SpO2:  100%    Constitutional: Alert and nontoxic appearing Eyes: Conjunctivae are normal.  Head: Atraumatic. Nose: No congestion/rhinnorhea. Mouth/Throat: Mucous membranes are moist.   Neck: No stridor. Painless ROM.  Cardiovascular: Normal rate, regular rhythm. Grossly normal heart sounds.  Good peripheral circulation. Respiratory: Normal respiratory effort.  No retractions. Lungs CTAB. Gastrointestinal: Soft and nontender. No distention. No abdominal bruits. No CVA tenderness. Genitourinary:  Musculoskeletal: No lower extremity tenderness nor edema.  No joint effusions. Neurologic:  Normal speech and language. No gross focal neurologic deficits are appreciated. No facial droop Skin:  Skin is warm, dry and intact. No rash noted. Psychiatric: Mood and affect are normal. Speech and behavior are  normal.  ____________________________________________   LABS (all labs ordered are listed, but only abnormal results are displayed)  Results for orders placed or performed during the hospital encounter of 05/18/18 (from the past 24 hour(s))  Basic metabolic panel     Status: Abnormal   Collection Time: 05/18/18  1:14 PM  Result Value Ref Range   Sodium 143 135 - 145 mmol/L   Potassium 4.1 3.5 - 5.1 mmol/L   Chloride 110 98 - 111 mmol/L   CO2 27 22 - 32 mmol/L   Glucose, Bld 101 (H) 70 - 99 mg/dL   BUN 21 8 - 23 mg/dL   Creatinine, Ser 6.04 (H) 0.44 - 1.00 mg/dL   Calcium 9.5 8.9 - 54.0 mg/dL   GFR calc non Af Amer 47 (L) >60 mL/min   GFR calc Af Amer 55 (L) >60 mL/min   Anion gap 6 5 - 15  CBC     Status: Abnormal   Collection Time: 05/18/18  1:14 PM  Result Value Ref Range   WBC 6.0 3.6 - 11.0 K/uL   RBC 3.24 (L) 3.80 - 5.20  MIL/uL   Hemoglobin 10.7 (L) 12.0 - 16.0 g/dL   HCT 29.5 (L) 62.1 - 30.8 %   MCV 92.6 80.0 - 100.0 fL   MCH 32.9 26.0 - 34.0 pg   MCHC 35.6 32.0 - 36.0 g/dL   RDW 65.7 84.6 - 96.2 %   Platelets 354 150 - 440 K/uL  Troponin I     Status: None   Collection Time: 05/18/18  1:14 PM  Result Value Ref Range   Troponin I <0.03 <0.03 ng/mL  Urinalysis, Complete w Microscopic     Status: Abnormal   Collection Time: 05/18/18  6:26 PM  Result Value Ref Range   Color, Urine YELLOW (A) YELLOW   APPearance HAZY (A) CLEAR   Specific Gravity, Urine 1.018 1.005 - 1.030   pH 5.0 5.0 - 8.0   Glucose, UA NEGATIVE NEGATIVE mg/dL   Hgb urine dipstick NEGATIVE NEGATIVE   Bilirubin Urine NEGATIVE NEGATIVE   Ketones, ur NEGATIVE NEGATIVE mg/dL   Protein, ur NEGATIVE NEGATIVE mg/dL   Nitrite NEGATIVE NEGATIVE   Leukocytes, UA NEGATIVE NEGATIVE   RBC / HPF 0-5 0 - 5 RBC/hpf   WBC, UA 6-10 0 - 5 WBC/hpf   Bacteria, UA RARE (A) NONE SEEN   Squamous Epithelial / LPF 6-10 0 - 5   Mucus PRESENT    Hyaline Casts, UA PRESENT     ____________________________________________  EKG My review and personal interpretation at Time: 13:01   Indication: dizziness  Rate: 90  Rhythm: sinus Axis: normal Other: normal intervals, no stemi ____________________________________________  RADIOLOGY  I personally reviewed all radiographic images ordered to evaluate for the above acute complaints and reviewed radiology reports and findings.  These findings were personally discussed with the patient.  Please see medical record for radiology report.  ____________________________________________   PROCEDURES  Procedure(s) performed:  Procedures    Critical Care performed: no ____________________________________________   INITIAL IMPRESSION / ASSESSMENT AND PLAN / ED COURSE  Pertinent labs & imaging results that were available during my care of the patient were reviewed by me and considered in my medical decision making (see chart for details).   DDX: Dehydration, sepsis, pna, uti, hypoglycemia,   Kellyanne L Ullman is a 84 y.o. who presents to the ED with sx as described above.  Patient is AFVSS in ED. Exam as above. Given current presentation have considered the above differential.  The patient will be placed on continuous pulse oximetry and telemetry for monitoring.  Laboratory evaluation will be sent to evaluate for the above complaints.  Exam reassuring.    Clinical Course as of May 18 2306  Thu May 18, 2018  [id number] Chest x-ray reassuring.  Based on her symptoms we will give dose of Keflex and treat for UTI and send for urine culture.  Patient had similar symptoms previously with improvement after antibiotics therefore have low threshold to treat at this time.  Otherwise symptoms seem to be improved.  Neuro exam nonfocal.  At this point do believe she stable and appropriate for outpatient follow-up.   [PR]    Clinical Course User Index [PR] Willy Eddy, MD     As part of my medical decision making, I reviewed  the following data within the electronic MEDICAL RECORD NUMBER Nursing notes reviewed and incorporated, Labs reviewed, notes from prior ED visits.   ____________________________________________   FINAL CLINICAL IMPRESSION(S) / ED DIAGNOSES  Final diagnoses:  Dizziness      NEW MEDICATIONS STARTED DURING THIS VISIT:  Discharge  Medication List as of 05/18/2018  7:21 PM    START taking these medications   Details  !! cephALEXin (KEFLEX) 500 MG capsule Take 1 capsule (500 mg total) by mouth 3 (three) times daily for 5 days., Starting Thu 05/18/2018, Until Tue 05/23/2018, Normal    fluconazole (DIFLUCAN) 150 MG tablet Take 1 tablet (150 mg total) by mouth daily., Starting Thu 05/18/2018, Normal     !! - Potential duplicate medications found. Please discuss with provider.       Note:  This document was prepared using Dragon voice recognition software and may include unintentional dictation errors.    Willy Eddy, MD 05/18/18 [phone]

## 2018-05-18 NOTE — ED Triage Notes (Signed)
Pt reports that she developed dizziness this am, care giver reports that she was giving her a bath and when she stood her up she became very dizzy. Pt denies any other symptoms at this time.

## 2018-05-18 NOTE — Discharge Instructions (Signed)
Please be sure to drink plenty of fluids.  Take antibiotics as prescribed.  Return for any worsening symptoms including headaches, numbness or tingling, recurrent falls, chest pain or shortness of breath.

## 2018-05-20 LAB — URINE CULTURE

## 2018-05-29 ENCOUNTER — Emergency Department
Admission: EM | Admit: 2018-05-29 | Discharge: 2018-05-29 | Disposition: A | Discharge status: Home / Self Care | Payer: Medicare Other | Attending: Emergency Medicine | Admitting: Emergency Medicine

## 2018-05-29 ENCOUNTER — Emergency Department: Payer: Medicare Other

## 2018-05-29 ENCOUNTER — Encounter: Payer: Self-pay | Admitting: Emergency Medicine

## 2018-05-29 ENCOUNTER — Other Ambulatory Visit: Payer: Self-pay

## 2018-05-29 DIAGNOSIS — R2232 Localized swelling, mass and lump, left upper limb: Secondary | ICD-10-CM | POA: Insufficient documentation

## 2018-05-29 DIAGNOSIS — Z79899 Other long term (current) drug therapy: Secondary | ICD-10-CM | POA: Insufficient documentation

## 2018-05-29 DIAGNOSIS — Z7984 Long term (current) use of oral hypoglycemic drugs: Secondary | ICD-10-CM | POA: Diagnosis not present

## 2018-05-29 DIAGNOSIS — M7989 Other specified soft tissue disorders: Secondary | ICD-10-CM

## 2018-05-29 DIAGNOSIS — E119 Type 2 diabetes mellitus without complications: Secondary | ICD-10-CM | POA: Insufficient documentation

## 2018-05-29 DIAGNOSIS — Z8673 Personal history of transient ischemic attack (TIA), and cerebral infarction without residual deficits: Secondary | ICD-10-CM | POA: Insufficient documentation

## 2018-05-29 DIAGNOSIS — I10 Essential (primary) hypertension: Secondary | ICD-10-CM | POA: Diagnosis not present

## 2018-05-29 DIAGNOSIS — F0390 Unspecified dementia without behavioral disturbance: Secondary | ICD-10-CM | POA: Insufficient documentation

## 2018-05-29 DIAGNOSIS — Z950 Presence of cardiac pacemaker: Secondary | ICD-10-CM | POA: Insufficient documentation

## 2018-05-29 LAB — URINALYSIS, COMPLETE (UACMP) WITH MICROSCOPIC
Bacteria, UA: NONE SEEN
Bilirubin Urine: NEGATIVE
GLUCOSE, UA: NEGATIVE mg/dL
HGB URINE DIPSTICK: NEGATIVE
KETONES UR: 5 mg/dL — AB
LEUKOCYTES UA: NEGATIVE
NITRITE: NEGATIVE
Protein, ur: NEGATIVE mg/dL
Specific Gravity, Urine: 1.02 (ref 1.005–1.030)
Squamous Epithelial / LPF: NONE SEEN (ref 0–5)
pH: 5 (ref 5.0–8.0)

## 2018-05-29 NOTE — Discharge Instructions (Addendum)
Follow-up with your regular doctor if you continue to have any problems.  Return to the emergency department if you are worsening.  If the left arm begins to have more pain or swelling please return to the emergency department.

## 2018-05-29 NOTE — ED Triage Notes (Signed)
Pt in via POV w/ caregiver and daughter, per caregiver, "We are here because she is hotter than normal, for like 20 minutes she was just complaining of being hot and that is not like her.  And she has this swelling and the rescue said she may could have a bite."  Some swelling noted to left hand.  Pt denies any pain.  Pt alert, vitals WDL, NAD noted at this time.

## 2018-05-29 NOTE — ED Provider Notes (Signed)
[facility] Emergency Department Provider Note  ____________________________________________   First MD Initiated Contact with Patient 05/29/18 1643     (approximate)  I have reviewed the triage vital signs and the nursing notes.   HISTORY  Chief Complaint Swelling    HPI Jessica Gardner is a 84 y.o. female emergency department because she felt hotter than normal for about 20 minutes.  They are unsure if she had a fever.  They were can also concerned because she has some swelling on the left arm and tenderness noted.  EMS told her that she could have a bug bite.  The daughter is upset as she feels that her mother is constantly being brought to the hospital for UTIs and nonspecific complaints.  The patient denies chest pain or shortness of breath.  She denies any swelling in her feet.  She does live at home.  Her son lives in the home with her.  She also has an aide that stays during the day to help.    Past Medical History:  Diagnosis Date  . Dementia   . Diabetes mellitus   . MI (myocardial infarction) (HCC)   . Pacemaker   . Stroke [facility])     Patient Active Problem List   Diagnosis Date Noted  . Presyncope 01/02/2018  . UTI (urinary tract infection) 01/02/2018  . Dehydration 01/02/2018  . HTN (hypertension) 01/02/2018  . Hyperlipidemia 01/02/2018  . Postural dizziness with presyncope 01/02/2018  . Hypokalemia 01/02/2018  . Left leg weakness 09/09/2017  . Diabetes (HCC) 09/09/2017  . Dementia 09/09/2017  . History of stroke 09/09/2017  . Acute bronchitis 09/18/2016  . Elevated troponin 09/18/2016  . Anemia 09/18/2016  . Renal insufficiency 09/18/2016  . Generalized weakness 09/18/2016  . Chest pain 09/17/2016    Past Surgical History:  Procedure Laterality Date  . PACEMAKER INSERTION      Prior to Admission medications   Medication Sig Start Date End Date Taking? Authorizing Provider  acetaminophen (TYLENOL) 325 MG tablet Take 2  tablets (650 mg total) by mouth every 4 (four) hours as needed for mild pain (or temp > 37.5 C (99.5 F)). 09/12/17   Alford Highland, MD  amLODipine (NORVASC) 5 MG tablet Take 5 mg by mouth daily. 09/26/17   [provider]  bimatoprost (LUMIGAN) 0.01 % SOLN Place 1 drop into both eyes 2 (two) times daily.    [provider]  dipyridamole-aspirin (AGGRENOX) 200-25 MG 12hr capsule Take 1 capsule by mouth 2 (two) times daily. 09/26/17   [provider]  Fe Fum-FePoly-FA-Vit C-Vit B3 (INTEGRA F) 125-1 MG CAPS Take 1 capsule by mouth daily. 10/24/17   [provider]  fluconazole (DIFLUCAN) 150 MG tablet Take 1 tablet (150 mg total) by mouth daily. 05/18/18   Willy Eddy, MD  gabapentin (NEURONTIN) 100 MG capsule Take 100 mg by mouth at bedtime.    [provider]  glipiZIDE (GLUCOTROL XL) 5 MG 24 hr tablet Take 1 tablet by mouth daily. 09/26/17   [provider]  Ipratropium-Albuterol (COMBIVENT RESPIMAT) 20-100 MCG/ACT AERS respimat Inhale 1 puff into the lungs every 6 (six) hours. 09/18/16   Katharina Caper, MD  latanoprost (XALATAN) 0.005 % ophthalmic solution Place 1 drop into both eyes at bedtime.    [provider]  lisinopril (PRINIVIL,ZESTRIL) 20 MG tablet Take 20 mg by mouth daily. 09/26/17   [provider]  metFORMIN (GLUCOPHAGE) 500 MG tablet Take 500 mg by mouth 2 (two) times  daily.    [provider]  metoprolol succinate (TOPROL-XL) 50 MG 24 hr tablet Take 1 tablet by mouth daily. 09/26/17   [provider]  omeprazole (PRILOSEC) 20 MG capsule Take 20 mg by mouth daily.    [provider]  PARoxetine (PAXIL) 20 MG tablet Take 20 mg by mouth daily.    [provider]  simvastatin (ZOCOR) 20 MG tablet Take 20 mg by mouth daily. 09/26/17   [provider]  Vitamin D, Ergocalciferol, (DRISDOL) 50000 units CAPS capsule Take 50,000 Units by mouth every 7 (seven) days.    [provider]    Allergies Patient has no known allergies.  Family History  Problem Relation Age of Onset  . Breast cancer Sister 42    Social History Social History   Tobacco Use  . Smoking status: Never Smoker  . Smokeless tobacco: Never Used  Substance Use Topics  . Alcohol use: No  . Drug use: No    Review of Systems  Constitutional: No fever/chills, felt hot Eyes: No visual changes. ENT: No sore throat. Respiratory: Denies cough Genitourinary: Negative for dysuria. Musculoskeletal: Negative for back pain.  Left arm pain and swelling of the hand Skin: Negative for rash.    ____________________________________________   PHYSICAL EXAM:  VITAL SIGNS: ED Triage Vitals  Enc Vitals Group     BP 05/29/18 1618 (!) 121/52     Pulse Rate 05/29/18 1618 88     Resp 05/29/18 1618 16     Temp 05/29/18 1618 98.6 F (37 C)     Temp Source 05/29/18 1618 Oral     SpO2 05/29/18 1618 100 %     Weight 05/29/18 1619 132 lb (59.9 kg)     Height 05/29/18 1619 5\' 1"  (1.549 m)     Head Circumference --      Peak Flow --      Pain Score 05/29/18 1619 0     Pain Loc --      Pain Edu? --      Excl. in GC? --     Constitutional: Alert and oriented. Well appearing and in no acute distress.  Is able to answer all questions appropriately Eyes: Conjunctivae are normal.  Head: Atraumatic. Nose: No congestion/rhinnorhea. Mouth/Throat: Mucous membranes are moist.   Neck:  supple no lymphadenopathy noted Cardiovascular: Normal rate, regular rhythm. Heart sounds are normal Respiratory: Normal respiratory effort.  No retractions, lungs c t a  Abd: soft nontender bs normal all 4 quad GU: deferred Musculoskeletal: FROM all extremities, warm and well perfused.  No redness noted on the left arm.  The left arm is tender at the humerus and in the left axilla. Neurologic:  Normal speech and language.  Skin:  Skin is warm, dry and intact. No rash noted. Psychiatric: Mood and affect are  normal. Speech and behavior are normal.  ____________________________________________   LABS (all labs ordered are listed, but only abnormal results are displayed)  Labs Reviewed  URINALYSIS, COMPLETE (UACMP) WITH MICROSCOPIC - Abnormal; Notable for the following components:      Result Value   Color, Urine YELLOW (*)    APPearance CLEAR (*)    Ketones, ur 5 (*)    All other components within normal limits   ____________________________________________   ____________________________________________  RADIOLOGY  X-ray of the left humerus is negative Ultrasound left upper extremity is negative for DVT ____________________________________________   PROCEDURES  Procedure(s) performed: No  Procedures    ____________________________________________  INITIAL IMPRESSION / ASSESSMENT AND PLAN / ED COURSE  Pertinent labs & imaging results that were available during my care of the patient were reviewed by me and considered in my medical decision making (see chart for details).   Patient is an 84 year old female presents emergency department via EMS.  Her daughter is concerned as the patient felt hotter than normal for about 20 minutes.  She states she is normally cold not hot.  They are concerned that she has an infection or a bug bite due to some swelling on the left arm.  She denies any chest pain or shortness of breath.  She was recently in the hospital and had an IVF which they are not sure which arm.  On physical exam the patient does appear well.  Vitals are basically normal.  The left humerus is tender to palpation in the left axilla is tender to palpation.  No redness or swelling is noted on the lower part of the arm.  The remainder the exam is unremarkable  X-ray of the left humerus Ultrasound of the left arm    ----------------------------------------- 6:07 PM on 05/29/2018 -----------------------------------------  X-ray left humerus is negative and the ultrasound  was negative for DVT.  Explained the findings to the daughter.  Also explained to her that the UTI had resolved.  The patient states that she is not feeling hot anymore she states she does not understand why she gets so hot suddenly and then it went away.  The daughter was given instructions to return with the patient as soon as possible if she is worsening.  She states she understands and will comply.  The patient was discharged in stable condition  As part of my medical decision making, I reviewed the following data within the electronic MEDICAL RECORD NUMBER History obtained from family, Nursing notes reviewed and incorporated, Labs reviewed UA is negative, Old chart reviewed, Radiograph reviewed x-ray of the left humerus is negative, ultrasound of the left upper extremity is negative, Notes from prior ED visits and [place] Controlled Substance Database  ____________________________________________   FINAL CLINICAL IMPRESSION(S) / ED DIAGNOSES  Final diagnoses:  Swelling of extremity, left      NEW MEDICATIONS STARTED DURING THIS VISIT:  Current Discharge Medication List       Note:  This document was prepared using Dragon voice recognition software and may include unintentional dictation errors.    Faythe GheeFisher, Asuna Peth W, PA-C 05/29/18 Cristy [id number]    Stafford, Phillip, MD 06/04/18 Jerene [id number]

## 2018-07-31 ENCOUNTER — Emergency Department: Payer: Medicare Other

## 2018-07-31 ENCOUNTER — Encounter: Payer: Self-pay | Admitting: Emergency Medicine

## 2018-07-31 ENCOUNTER — Observation Stay
Admission: EM | Admit: 2018-07-31 | Discharge: 2018-08-04 | Disposition: A | Discharge status: Skilled Nursing Facility | Payer: Medicare Other | Attending: Internal Medicine | Admitting: Internal Medicine

## 2018-07-31 ENCOUNTER — Other Ambulatory Visit: Payer: Self-pay

## 2018-07-31 DIAGNOSIS — N183 Chronic kidney disease, stage 3 (moderate): Secondary | ICD-10-CM | POA: Diagnosis not present

## 2018-07-31 DIAGNOSIS — M8580 Other specified disorders of bone density and structure, unspecified site: Secondary | ICD-10-CM | POA: Diagnosis not present

## 2018-07-31 DIAGNOSIS — I69354 Hemiplegia and hemiparesis following cerebral infarction affecting left non-dominant side: Secondary | ICD-10-CM | POA: Insufficient documentation

## 2018-07-31 DIAGNOSIS — R531 Weakness: Secondary | ICD-10-CM | POA: Insufficient documentation

## 2018-07-31 DIAGNOSIS — Z794 Long term (current) use of insulin: Secondary | ICD-10-CM | POA: Insufficient documentation

## 2018-07-31 DIAGNOSIS — R42 Dizziness and giddiness: Secondary | ICD-10-CM | POA: Insufficient documentation

## 2018-07-31 DIAGNOSIS — E785 Hyperlipidemia, unspecified: Secondary | ICD-10-CM | POA: Diagnosis not present

## 2018-07-31 DIAGNOSIS — R262 Difficulty in walking, not elsewhere classified: Secondary | ICD-10-CM | POA: Diagnosis not present

## 2018-07-31 DIAGNOSIS — E119 Type 2 diabetes mellitus without complications: Secondary | ICD-10-CM

## 2018-07-31 DIAGNOSIS — D649 Anemia, unspecified: Secondary | ICD-10-CM | POA: Diagnosis not present

## 2018-07-31 DIAGNOSIS — Z79899 Other long term (current) drug therapy: Secondary | ICD-10-CM | POA: Insufficient documentation

## 2018-07-31 DIAGNOSIS — I252 Old myocardial infarction: Secondary | ICD-10-CM | POA: Insufficient documentation

## 2018-07-31 DIAGNOSIS — I10 Essential (primary) hypertension: Secondary | ICD-10-CM | POA: Diagnosis present

## 2018-07-31 DIAGNOSIS — Z950 Presence of cardiac pacemaker: Secondary | ICD-10-CM | POA: Insufficient documentation

## 2018-07-31 DIAGNOSIS — Z7982 Long term (current) use of aspirin: Secondary | ICD-10-CM | POA: Insufficient documentation

## 2018-07-31 DIAGNOSIS — I700 Atherosclerosis of aorta: Secondary | ICD-10-CM | POA: Diagnosis not present

## 2018-07-31 DIAGNOSIS — E1122 Type 2 diabetes mellitus with diabetic chronic kidney disease: Secondary | ICD-10-CM | POA: Insufficient documentation

## 2018-07-31 DIAGNOSIS — I2510 Atherosclerotic heart disease of native coronary artery without angina pectoris: Secondary | ICD-10-CM | POA: Diagnosis not present

## 2018-07-31 DIAGNOSIS — G4733 Obstructive sleep apnea (adult) (pediatric): Secondary | ICD-10-CM | POA: Diagnosis not present

## 2018-07-31 DIAGNOSIS — I129 Hypertensive chronic kidney disease with stage 1 through stage 4 chronic kidney disease, or unspecified chronic kidney disease: Secondary | ICD-10-CM | POA: Insufficient documentation

## 2018-07-31 DIAGNOSIS — R339 Retention of urine, unspecified: Secondary | ICD-10-CM | POA: Diagnosis not present

## 2018-07-31 DIAGNOSIS — Z87440 Personal history of urinary (tract) infections: Secondary | ICD-10-CM | POA: Diagnosis not present

## 2018-07-31 DIAGNOSIS — I7774 Dissection of vertebral artery: Secondary | ICD-10-CM | POA: Diagnosis present

## 2018-07-31 DIAGNOSIS — F0390 Unspecified dementia without behavioral disturbance: Secondary | ICD-10-CM | POA: Diagnosis present

## 2018-07-31 DIAGNOSIS — Z7984 Long term (current) use of oral hypoglycemic drugs: Secondary | ICD-10-CM | POA: Insufficient documentation

## 2018-07-31 LAB — URINALYSIS, COMPLETE (UACMP) WITH MICROSCOPIC
BILIRUBIN URINE: NEGATIVE
Bacteria, UA: NONE SEEN
GLUCOSE, UA: NEGATIVE mg/dL
HGB URINE DIPSTICK: NEGATIVE
Ketones, ur: NEGATIVE mg/dL
Leukocytes, UA: NEGATIVE
Nitrite: NEGATIVE
PH: 5 (ref 5.0–8.0)
Protein, ur: NEGATIVE mg/dL
SPECIFIC GRAVITY, URINE: 1.017 (ref 1.005–1.030)

## 2018-07-31 LAB — CBC
HCT: 38 % (ref 36.0–46.0)
Hemoglobin: 12.5 g/dL (ref 12.0–15.0)
MCH: 31.3 pg (ref 26.0–34.0)
MCHC: 32.9 g/dL (ref 30.0–36.0)
MCV: 95 fL (ref 80.0–100.0)
PLATELETS: 324 10*3/uL (ref 150–400)
RBC: 4 MIL/uL (ref 3.87–5.11)
RDW: 11.5 % (ref 11.5–15.5)
WBC: 6.5 10*3/uL (ref 4.0–10.5)
nRBC: 0 % (ref 0.0–0.2)

## 2018-07-31 LAB — COMPREHENSIVE METABOLIC PANEL
ALK PHOS: 65 U/L (ref 38–126)
ALT: 18 U/L (ref 0–44)
AST: 30 U/L (ref 15–41)
Albumin: 4.2 g/dL (ref 3.5–5.0)
Anion gap: 14 (ref 5–15)
BILIRUBIN TOTAL: 0.9 mg/dL (ref 0.3–1.2)
BUN: 14 mg/dL (ref 8–23)
CALCIUM: 9.8 mg/dL (ref 8.9–10.3)
CO2: 27 mmol/L (ref 22–32)
CREATININE: 1.2 mg/dL — AB (ref 0.44–1.00)
Chloride: 102 mmol/L (ref 98–111)
GFR, EST AFRICAN AMERICAN: 46 mL/min — AB (ref >60)
GFR, EST NON AFRICAN AMERICAN: 40 mL/min — AB (ref >60)
Glucose, Bld: 134 mg/dL — ABNORMAL HIGH (ref 70–99)
Potassium: 4.4 mmol/L (ref 3.5–5.1)
Sodium: 143 mmol/L (ref 135–145)
TOTAL PROTEIN: 8.2 g/dL — AB (ref 6.5–8.1)

## 2018-07-31 LAB — TROPONIN I: Troponin I: <0.03 ng/mL (ref <0.03)

## 2018-07-31 LAB — GLUCOSE, CAPILLARY: GLUCOSE-CAPILLARY: 101 mg/dL — AB (ref 70–99)

## 2018-07-31 MED ORDER — GABAPENTIN 100 MG PO CAPS
100.0000 mg | ORAL_CAPSULE | Freq: Every day | ORAL | Status: DC
  Administered 2018-08-01 – 2018-08-03 (×3): 100 mg via ORAL
  Filled 2018-07-31 (×3): qty 1

## 2018-07-31 MED ORDER — ACETAMINOPHEN 650 MG RE SUPP: 650.0000 mg | Freq: Four times a day (QID) | RECTAL | Status: DC | PRN

## 2018-07-31 MED ORDER — INSULIN ASPART 100 UNIT/ML ~~LOC~~ SOLN
0.0000 [IU] | Freq: Every day | SUBCUTANEOUS | Status: DC
  Administered 2018-08-02: 21:00:00 3 [IU] via SUBCUTANEOUS
  Filled 2018-07-31: qty 1

## 2018-07-31 MED ORDER — ONDANSETRON HCL 4 MG/2ML IJ SOLN: 4.0000 mg | Freq: Four times a day (QID) | INTRAMUSCULAR | Status: DC | PRN

## 2018-07-31 MED ORDER — INSULIN ASPART 100 UNIT/ML ~~LOC~~ SOLN
0.0000 [IU] | Freq: Three times a day (TID) | SUBCUTANEOUS | Status: DC
  Administered 2018-08-01 (×2): 1 [IU] via SUBCUTANEOUS
  Administered 2018-08-01: 18:00:00 2 [IU] via SUBCUTANEOUS
  Administered 2018-08-02 (×2): 1 [IU] via SUBCUTANEOUS
  Administered 2018-08-02: 13:00:00 2 [IU] via SUBCUTANEOUS
  Administered 2018-08-03: 1 [IU] via SUBCUTANEOUS
  Administered 2018-08-03: 2 [IU] via SUBCUTANEOUS
  Administered 2018-08-04: 5 [IU] via SUBCUTANEOUS
  Administered 2018-08-04: 1 [IU] via SUBCUTANEOUS
  Filled 2018-07-31 (×9): qty 1

## 2018-07-31 MED ORDER — IOHEXOL 350 MG/ML SOLN
75.0000 mL | Freq: Once | INTRAVENOUS | Status: AC | PRN
  Administered 2018-07-31: 75 mL via INTRAVENOUS

## 2018-07-31 MED ORDER — PANTOPRAZOLE SODIUM 40 MG PO TBEC
40.0000 mg | DELAYED_RELEASE_TABLET | Freq: Every day | ORAL | Status: DC
  Administered 2018-08-01 – 2018-08-04 (×4): 40 mg via ORAL
  Filled 2018-07-31 (×4): qty 1

## 2018-07-31 MED ORDER — ONDANSETRON HCL 4 MG PO TABS: 4.0000 mg | ORAL_TABLET | Freq: Four times a day (QID) | ORAL | Status: DC | PRN

## 2018-07-31 MED ORDER — SODIUM CHLORIDE 0.9 % IV BOLUS
500.0000 mL | Freq: Once | INTRAVENOUS | Status: AC
  Administered 2018-07-31: 500 mL via INTRAVENOUS

## 2018-07-31 MED ORDER — ASPIRIN-DIPYRIDAMOLE ER 25-200 MG PO CP12
1.0000 | ORAL_CAPSULE | Freq: Two times a day (BID) | ORAL | Status: DC
  Administered 2018-08-01 – 2018-08-04 (×7): 1 via ORAL
  Filled 2018-07-31 (×9): qty 1

## 2018-07-31 MED ORDER — ENOXAPARIN SODIUM 30 MG/0.3ML ~~LOC~~ SOLN
30.0000 mg | SUBCUTANEOUS | Status: DC
  Administered 2018-08-01 – 2018-08-04 (×3): 30 mg via SUBCUTANEOUS
  Filled 2018-07-31 (×4): qty 0.3

## 2018-07-31 MED ORDER — LATANOPROST 0.005 % OP SOLN
1.0000 [drp] | Freq: Every day | OPHTHALMIC | Status: DC
  Administered 2018-08-01 – 2018-08-03 (×4): 1 [drp] via OPHTHALMIC
  Filled 2018-07-31: qty 2.5

## 2018-07-31 MED ORDER — ACETAMINOPHEN 325 MG PO TABS: 650.0000 mg | ORAL_TABLET | Freq: Four times a day (QID) | ORAL | Status: DC | PRN

## 2018-07-31 MED ORDER — PAROXETINE HCL 20 MG PO TABS
20.0000 mg | ORAL_TABLET | Freq: Every day | ORAL | Status: DC
  Administered 2018-08-01 – 2018-08-04 (×4): 20 mg via ORAL
  Filled 2018-07-31 (×5): qty 1

## 2018-07-31 MED ORDER — ASPIRIN 81 MG PO CHEW
324.0000 mg | CHEWABLE_TABLET | Freq: Once | ORAL | Status: AC
  Administered 2018-07-31: 324 mg via ORAL
  Filled 2018-07-31: qty 4

## 2018-07-31 MED ORDER — SIMVASTATIN 20 MG PO TABS
20.0000 mg | ORAL_TABLET | Freq: Every day | ORAL | Status: DC
  Administered 2018-08-01 – 2018-08-03 (×3): 20 mg via ORAL
  Filled 2018-07-31 (×3): qty 1

## 2018-07-31 NOTE — ED Notes (Signed)
MD at the bedside to discuss results and plan of care.  

## 2018-07-31 NOTE — ED Notes (Signed)
MD at the bedside to discuss plan of care.  

## 2018-07-31 NOTE — ED Notes (Signed)
Pt to CT

## 2018-07-31 NOTE — H&P (Signed)
[facility] - [place] at [facility]   PATIENT NAME: Jessica Gardner    MR#:  [id number]  DATE OF BIRTH:  May 18, 1930  DATE OF ADMISSION:  07/31/2018  PRIMARY CARE PHYSICIAN: Barbette ReichmannHande, Vishwanath, MD   REQUESTING/REFERRING PHYSICIAN: Roxan Hockeyobinson, MD  CHIEF COMPLAINT:   Chief Complaint  Patient presents with  . Weakness    HISTORY OF PRESENT ILLNESS:  Jessica Gardner  is a 84 y.o. female who presents with chief complaint as above.  Patient presents with 2 days of increased weakness.  Family states that she normally is up early in the morning and about doing her activities of daily living.  For the last 2 days she has not been getting up and about like normal, and she has been significantly weaker.  She notes weakness specifically in her lower extremities.  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  Neurology contacted by ED physician and they recommended admission with aspirin and neurology consult.  PAST MEDICAL HISTORY:   Past Medical History:  Diagnosis Date  . Dementia (HCC)   . Diabetes mellitus   . MI (myocardial infarction) (HCC)   . Pacemaker   . Stroke [facility](HCC)      PAST SURGICAL HISTORY:   Past Surgical History:  Procedure Laterality Date  . PACEMAKER INSERTION       SOCIAL HISTORY:   Social History   Tobacco Use  . Smoking status: Never Smoker  . Smokeless tobacco: Never Used  Substance Use Topics  . Alcohol use: No     FAMILY HISTORY:   Family History  Problem Relation Age of Onset  . Breast cancer Sister [id number]     DRUG ALLERGIES:  No Known Allergies  MEDICATIONS AT HOME:   Prior to Admission medications   Medication Sig Start Date End Date Taking? Authorizing Provider  acetaminophen (TYLENOL) 325 MG tablet Take 2 tablets (650 mg total) by mouth every 4 (four) hours as needed for mild pain (or temp > 37.5 C (99.5 F)). 09/12/17   Alford HighlandWieting, Richard, MD  amLODipine  (NORVASC) 5 MG tablet Take 5 mg by mouth daily. 09/26/17   [provider]  bimatoprost (LUMIGAN) 0.01 % SOLN Place 1 drop into both eyes 2 (two) times daily.    [provider]  dipyridamole-aspirin (AGGRENOX) 200-25 MG 12hr capsule Take 1 capsule by mouth 2 (two) times daily. 09/26/17   [provider]  Fe Fum-FePoly-FA-Vit C-Vit B3 (INTEGRA F) 125-1 MG CAPS Take 1 capsule by mouth daily. 10/24/17   [provider]  fluconazole (DIFLUCAN) 150 MG tablet Take 1 tablet (150 mg total) by mouth daily. 05/18/18   Willy Eddyobinson, Patrick, MD  gabapentin (NEURONTIN) 100 MG capsule Take 100 mg by mouth at bedtime.    [provider]  glipiZIDE (GLUCOTROL XL) 5 MG 24 hr tablet Take 1 tablet by mouth daily. 09/26/17   [provider]  Ipratropium-Albuterol (COMBIVENT RESPIMAT) 20-100 MCG/ACT AERS respimat Inhale 1 puff into the lungs every 6 (six) hours. 09/18/16   Katharina CaperVaickute, Rima, MD  latanoprost (XALATAN) 0.005 % ophthalmic solution Place 1 drop into both eyes at bedtime.    [provider]  lisinopril (PRINIVIL,ZESTRIL) 20 MG tablet Take 20 mg by mouth daily. 09/26/17   [provider]  metFORMIN (GLUCOPHAGE) 500 MG tablet Take 500 mg by mouth 2 (two) times daily.    [provider]  metoprolol succinate (TOPROL-XL) 50 MG 24 hr tablet Take  1 tablet by mouth daily. 09/26/17   [provider]  omeprazole (PRILOSEC) 20 MG capsule Take 20 mg by mouth daily.    [provider]  PARoxetine (PAXIL) 20 MG tablet Take 20 mg by mouth daily.    [provider]  simvastatin (ZOCOR) 20 MG tablet Take 20 mg by mouth daily. 09/26/17   [provider]  Vitamin D, Ergocalciferol, (DRISDOL) 50000 units CAPS capsule Take 50,000 Units by mouth every 7 (seven) days.    [provider]    REVIEW OF SYSTEMS:  Review of Systems  Constitutional: Negative for chills, fever, malaise/fatigue and weight loss.  HENT:  Negative for ear pain, hearing loss and tinnitus.   Eyes: Negative for blurred vision, double vision, pain and redness.  Respiratory: Negative for cough, hemoptysis and shortness of breath.   Cardiovascular: Negative for chest pain, palpitations, orthopnea and leg swelling.  Gastrointestinal: Negative for abdominal pain, constipation, diarrhea, nausea and vomiting.  Genitourinary: Negative for dysuria, frequency and hematuria.  Musculoskeletal: Negative for back pain, joint pain and neck pain.  Skin:       No acne, rash, or lesions  Neurological: Positive for weakness. Negative for dizziness, tremors and focal weakness.  Endo/Heme/Allergies: Negative for polydipsia. Does not bruise/bleed easily.  Psychiatric/Behavioral: Negative for depression. The patient is not nervous/anxious and does not have insomnia.      VITAL SIGNS:   Vitals:   07/31/18 1433 07/31/18 1900 07/31/18 2000 07/31/18 2138  BP:  113/62 120/62 131/62  Pulse:  78 71 67  Resp:  14 18 15   Temp:      TempSrc:      SpO2:  99% 99% 99%  Weight: 59.9 kg     Height: 5' (1.524 m)      Wt Readings from Last 3 Encounters:  07/31/18 59.9 kg  05/29/18 59.9 kg  05/18/18 61.2 kg    PHYSICAL EXAMINATION:  Physical Exam  Vitals reviewed. Constitutional: She is oriented to person, place, and time. She appears well-developed and well-nourished. No distress.  HENT:  Head: Normocephalic and atraumatic.  Mouth/Throat: Oropharynx is clear and moist.  Eyes: Pupils are equal, round, and reactive to light. Conjunctivae and EOM are normal. No scleral icterus.  Neck: Normal range of motion. Neck supple. No JVD present. No thyromegaly present.  Cardiovascular: Normal rate, regular rhythm and intact distal pulses. Exam reveals no gallop and no friction rub.  No murmur heard. Respiratory: Effort normal and breath sounds normal. No respiratory distress. She has no wheezes. She has no rales.  GI: Soft. Bowel sounds are normal. She  exhibits no distension. There is no tenderness.  Musculoskeletal: Normal range of motion. She exhibits no edema.  Neurologic: Cranial nerves II-XII intact, Sensation intact to light touch/pinprick, 5/5 strength in right extremities with 4/5 strength in left extremities, no dysarthria, no aphasia, no dysphagia, memory intact, finger to nose testing showed no abnormality, no pronator drift  Lymphadenopathy:    She has no cervical adenopathy.  Neurological: She is alert and oriented to person, place, and time. No cranial nerve deficit.  No dysarthria, no aphasia  Skin: Skin is warm and dry. No rash noted. No erythema.  Psychiatric: She has a normal mood and affect. Her behavior is normal. Judgment and thought content normal.    LABORATORY PANEL:   CBC Recent Labs  Lab 07/31/18 1456  WBC 6.5  HGB 12.5  HCT 38.0  PLT 324   ------------------------------------------------------------------------------------------------------------------  Chemistries  Recent Labs  Lab  07/31/18 1456  NA 143  K 4.4  CL 102  CO2 27  GLUCOSE 134*  BUN 14  CREATININE 1.20*  CALCIUM 9.8  AST 30  ALT 18  ALKPHOS 65  BILITOT 0.9   ------------------------------------------------------------------------------------------------------------------  Cardiac Enzymes Recent Labs  Lab 07/31/18 1456  TROPONINI <0.03   ------------------------------------------------------------------------------------------------------------------  RADIOLOGY:  Ct Angio Head W Or Wo Contrast  Result Date: 07/31/2018 CLINICAL DATA:  Generalized weakness. History of stroke, dementia, pacemaker. EXAM: CT ANGIOGRAPHY HEAD AND NECK TECHNIQUE: Multidetector CT imaging of the head and neck was performed using the standard protocol during bolus administration of intravenous contrast. Multiplanar CT image reconstructions and MIPs were obtained to evaluate the vascular anatomy. Carotid stenosis measurements (when applicable) are  obtained utilizing NASCET criteria, using the distal internal carotid diameter as the denominator. CONTRAST:  75mL OMNIPAQUE IOHEXOL 350 MG/ML SOLN COMPARISON:  CT HEAD July 31, 2018 and carotid ultrasound September 09, 2017. FINDINGS: CTA NECK FINDINGS: AORTIC ARCH: Normal appearance of the thoracic arch, normal branch pattern. Mild calcific atherosclerosis aortic arch. The origins of the innominate, left Common carotid artery and subclavian artery are widely patent. RIGHT CAROTID SYSTEM: Common carotid artery is patent. Mild calcific atherosclerosis of the carotid bifurcation without hemodynamically significant stenosis by NASCET criteria. Patent internal carotid artery. LEFT CAROTID SYSTEM: Common carotid artery is patent. Mild calcific atherosclerosis of the carotid bifurcation without hemodynamically significant stenosis by NASCET criteria. Patent internal carotid artery. VERTEBRAL ARTERIES:RIGHT vertebral artery is dominant. Severe luminal irregularity LEFT vertebral artery with loss of contrast opacification LEFT V3 segment. No discrete dissection flap or pseudoaneurysm. SKELETON: No acute osseous process though bone windows have not been submitted. OTHER NECK: Soft tissues of the neck are nonacute though, not tailored for evaluation. UPPER CHEST: Included lung apices are clear. No superior mediastinal lymphadenopathy. LEFT cardiac pacemaker via subclavian venous approach. CTA HEAD FINDINGS: ANTERIOR CIRCULATION: Patent cervical internal carotid arteries, petrous, cavernous and supra clinoid internal carotid arteries. Severe calcific atherosclerosis carotid siphons. Patent anterior communicating artery. Patent anterior and middle cerebral arteries. Moderate stenosis RIGHT A2 segment. Moderate bilateral M1 segment stenosis. Moderate stenosis LEFT M2 segment. Moderate stenosis RIGHT M3 segment. No large vessel occlusion, significant stenosis, contrast extravasation or aneurysm. POSTERIOR CIRCULATION:  Complete loss of LEFT vertebral artery contrast opacification. Patent RIGHT vertebral arteries mild calcific atherosclerosis. Moderate basilar artery tandem stenosis. Patent posterior cerebral arteries with moderate tandem stenosis. No large vessel occlusion, significant stenosis, contrast extravasation or aneurysm. VENOUS SINUSES: Major dural venous sinuses are patent though not tailored for evaluation on this angiographic examination. ANATOMIC VARIANTS: None. DELAYED PHASE: No abnormal intracranial enhancement. MIP images reviewed. IMPRESSION: CTA NECK: 1.  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 2. No hemodynamically significant stenosis internal carotid arteries. CTA HEAD: 1. Occluded LEFT vertebral artery without reconstitution. 2. No emergent large vessel occlusion. Multifocal moderate cerebral artery stenosis compatible with atherosclerosis. Acute findings discussed with and reconfirmed by Dr.PATRICK ROBINSON on 07/31/2018 at 9:26 pm. Aortic Atherosclerosis (ICD10-I70.0). Electronically Signed   By: Awilda Metro M.D.   On: 07/31/2018 21:27   Ct Head Wo Contrast  Result Date: 07/31/2018 CLINICAL DATA:  84 year old female with generalized weakness and dizziness today. History of prior stroke. EXAM: CT HEAD WITHOUT CONTRAST TECHNIQUE: Contiguous axial images were obtained from the base of the skull through the vertex without intravenous contrast. COMPARISON:  02/21/2018 head CT. FINDINGS: Brain: No intracranial hemorrhage or CT evidence of large acute infarct. Prominent chronic microvascular changes. Global atrophy. Ventricular prominence most  likely related to atrophy rather than hydrocephalus and without change. Aqueduct is patent. Prominent dural calcification lateral to the temporal lobes bilaterally. Meningiomas felt less likely consideration. Findings stable. Vascular: Vascular calcifications.  No acute hyperdense vessel. Skull: No acute abnormality. Sinuses/Orbits: No acute  orbital abnormality. Visualized paranasal sinuses clear. Other: Mastoid air cells and middle ear cavities are clear. IMPRESSION: 1. No acute intracranial abnormality noted. 2. Prominent chronic microvascular changes. 3. Global atrophy. Electronically Signed   By: Lacy Duverney M.D.   On: 07/31/2018 18:45   Ct Angio Neck W Or Wo Contrast  Result Date: 07/31/2018 CLINICAL DATA:  Generalized weakness. History of stroke, dementia, pacemaker. EXAM: CT ANGIOGRAPHY HEAD AND NECK TECHNIQUE: Multidetector CT imaging of the head and neck was performed using the standard protocol during bolus administration of intravenous contrast. Multiplanar CT image reconstructions and MIPs were obtained to evaluate the vascular anatomy. Carotid stenosis measurements (when applicable) are obtained utilizing NASCET criteria, using the distal internal carotid diameter as the denominator. CONTRAST:  75mL OMNIPAQUE IOHEXOL 350 MG/ML SOLN COMPARISON:  CT HEAD July 31, 2018 and carotid ultrasound September 09, 2017. FINDINGS: CTA NECK FINDINGS: AORTIC ARCH: Normal appearance of the thoracic arch, normal branch pattern. Mild calcific atherosclerosis aortic arch. The origins of the innominate, left Common carotid artery and subclavian artery are widely patent. RIGHT CAROTID SYSTEM: Common carotid artery is patent. Mild calcific atherosclerosis of the carotid bifurcation without hemodynamically significant stenosis by NASCET criteria. Patent internal carotid artery. LEFT CAROTID SYSTEM: Common carotid artery is patent. Mild calcific atherosclerosis of the carotid bifurcation without hemodynamically significant stenosis by NASCET criteria. Patent internal carotid artery. VERTEBRAL ARTERIES:RIGHT vertebral artery is dominant. Severe luminal irregularity LEFT vertebral artery with loss of contrast opacification LEFT V3 segment. No discrete dissection flap or pseudoaneurysm. SKELETON: No acute osseous process though bone windows have not been  submitted. OTHER NECK: Soft tissues of the neck are nonacute though, not tailored for evaluation. UPPER CHEST: Included lung apices are clear. No superior mediastinal lymphadenopathy. LEFT cardiac pacemaker via subclavian venous approach. CTA HEAD FINDINGS: ANTERIOR CIRCULATION: Patent cervical internal carotid arteries, petrous, cavernous and supra clinoid internal carotid arteries. Severe calcific atherosclerosis carotid siphons. Patent anterior communicating artery. Patent anterior and middle cerebral arteries. Moderate stenosis RIGHT A2 segment. Moderate bilateral M1 segment stenosis. Moderate stenosis LEFT M2 segment. Moderate stenosis RIGHT M3 segment. No large vessel occlusion, significant stenosis, contrast extravasation or aneurysm. POSTERIOR CIRCULATION: Complete loss of LEFT vertebral artery contrast opacification. Patent RIGHT vertebral arteries mild calcific atherosclerosis. Moderate basilar artery tandem stenosis. Patent posterior cerebral arteries with moderate tandem stenosis. No large vessel occlusion, significant stenosis, contrast extravasation or aneurysm. VENOUS SINUSES: Major dural venous sinuses are patent though not tailored for evaluation on this angiographic examination. ANATOMIC VARIANTS: None. DELAYED PHASE: No abnormal intracranial enhancement. MIP images reviewed. IMPRESSION: CTA NECK: 1.  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 2. No hemodynamically significant stenosis internal carotid arteries. CTA HEAD: 1. Occluded LEFT vertebral artery without reconstitution. 2. No emergent large vessel occlusion. Multifocal moderate cerebral artery stenosis compatible with atherosclerosis. Acute findings discussed with and reconfirmed by Dr.PATRICK ROBINSON on 07/31/2018 at 9:26 pm. Aortic Atherosclerosis (ICD10-I70.0). Electronically Signed   By: Awilda Metro M.D.   On: 07/31/2018 21:27    EKG:   Orders placed or performed during the hospital encounter of 07/31/18  .  EKG 12-Lead  . EKG 12-Lead  . ED EKG  . ED EKG    IMPRESSION AND PLAN:  Principal Problem:  Vertebral artery dissection (HCC) -unclear if this is related to her symptoms or not, will admit her tonight and get a neurology consultation for further recommendations.  Patient cannot have an MRI due to pacemaker Active Problems:   Diabetes (HCC) -carb modified diet with sliding scale insulin coverage   HTN (hypertension) -home dose antihypertensives   CAD (coronary artery disease) -continue home meds   Hyperlipidemia -Home dose antilipid  Chart review performed and case discussed with ED provider. Labs, imaging and/or ECG reviewed by provider and discussed with patient/family. Management plans discussed with the patient and/or family.  DVT PROPHYLAXIS: SubQ lovenox   GI PROPHYLAXIS:  None  ADMISSION STATUS: Observation  CODE STATUS: Full Code Status History    Date Active Date Inactive Code Status Order ID Comments User Context   01/02/2018 0126 01/03/2018 1721 Full Code [id number]  Rometta Emery, MD ED   09/09/2017 0146 09/13/2017 1942 Full Code [id number]  Oralia Manis, MD Inpatient   09/17/2016 1557 09/18/2016 1728 Full Code [id number]  Katha Hamming, MD ED    Advance Directive Documentation     Most Recent Value  Type of Advance Directive  Healthcare Power of Attorney [daughter]  Pre-existing out of facility DNR order (yellow form or pink MOST form)  -  "MOST" Form in Place?  -      TOTAL TIME TAKING CARE OF THIS PATIENT: 40 minutes.   Roshawna Colclasure FIELDING 07/31/2018, 10:49 PM  [facility]  Office  [phone]  CC: Primary care physician; Barbette Reichmann, MD  Note:  This document was prepared using Dragon voice recognition software and may include unintentional dictation errors.

## 2018-07-31 NOTE — ED Triage Notes (Signed)
Patient arrives with general weakness. States this am when she went patient was in bed and weak which is not normal for her. Patient can state name correctly, smile symmetrically, grips and leg strength weak but equally so. Does have history of stoke but not sure which side was weak after stroke or how long ago but recovered. Denies pain. States is dizzy.

## 2018-07-31 NOTE — ED Notes (Signed)
Patient transported to CT 

## 2018-07-31 NOTE — ED Notes (Addendum)
Admitting MD at the bedside for pt evaluation.  

## 2018-07-31 NOTE — ED Provider Notes (Signed)
[facility] Emergency Department Provider Note    First MD Initiated Contact with Patient 07/31/18 1805     (approximate)  I have reviewed the triage vital signs and the nursing notes.   HISTORY  Chief Complaint Weakness  Level V Caveat:  dementia  HPI Jessica Gardner is a 84 y.o. female history of dementia as well as CVA with residual left-sided weakness wheelchair-bound presents the ER for altered mental status since the weekend.  According to family patient was seen up last night around 2 AM with little confused and not acting normal.  EMS was called and checked her vital signs which were reassuring at that time.  She was not transported to the health care facility.  She then was more drowsy this morning and followed up with her [facility] clinic today where she was directed to the ER due to concern for altered mental status and possible stroke.  Family states that she has had poor p.o. intake this weekend and also has a history of recurrent UTIs which frequently cause confusion.  Denies any headache.  Denies any pain.  No recent fevers.  Has not made any urine today.    Past Medical History:  Diagnosis Date  . Dementia (HCC)   . Diabetes mellitus   . MI (myocardial infarction) (HCC)   . Pacemaker   . Stroke [facility])    Family History  Problem Relation Age of Onset  . Breast cancer Sister 84   Past Surgical History:  Procedure Laterality Date  . PACEMAKER INSERTION     Patient Active Problem List   Diagnosis Date Noted  . Presyncope 01/02/2018  . UTI (urinary tract infection) 01/02/2018  . Dehydration 01/02/2018  . HTN (hypertension) 01/02/2018  . Hyperlipidemia 01/02/2018  . Postural dizziness with presyncope 01/02/2018  . Hypokalemia 01/02/2018  . Left leg weakness 09/09/2017  . Diabetes (HCC) 09/09/2017  . Dementia (HCC) 09/09/2017  . History of stroke 09/09/2017  . Acute bronchitis 09/18/2016  . Elevated troponin 09/18/2016  . Anemia  09/18/2016  . Renal insufficiency 09/18/2016  . Generalized weakness 09/18/2016  . Chest pain 09/17/2016      Prior to Admission medications   Medication Sig Start Date End Date Taking? Authorizing Provider  acetaminophen (TYLENOL) 325 MG tablet Take 2 tablets (650 mg total) by mouth every 4 (four) hours as needed for mild pain (or temp > 37.5 C (99.5 F)). 09/12/17   Alford Highland, MD  amLODipine (NORVASC) 5 MG tablet Take 5 mg by mouth daily. 09/26/17   [provider]  bimatoprost (LUMIGAN) 0.01 % SOLN Place 1 drop into both eyes 2 (two) times daily.    [provider]  dipyridamole-aspirin (AGGRENOX) 200-25 MG 12hr capsule Take 1 capsule by mouth 2 (two) times daily. 09/26/17   [provider]  Fe Fum-FePoly-FA-Vit C-Vit B3 (INTEGRA F) 125-1 MG CAPS Take 1 capsule by mouth daily. 10/24/17   [provider]  fluconazole (DIFLUCAN) 150 MG tablet Take 1 tablet (150 mg total) by mouth daily. 05/18/18   Willy Eddy, MD  gabapentin (NEURONTIN) 100 MG capsule Take 100 mg by mouth at bedtime.    [provider]  glipiZIDE (GLUCOTROL XL) 5 MG 24 hr tablet Take 1 tablet by mouth daily. 09/26/17   [provider]  Ipratropium-Albuterol (COMBIVENT RESPIMAT) 20-100 MCG/ACT AERS respimat Inhale 1 puff into the lungs every 6 (six) hours. 09/18/16   Katharina Caper, MD  latanoprost (XALATAN) 0.005 % ophthalmic solution Place  1 drop into both eyes at bedtime.    [provider]  lisinopril (PRINIVIL,ZESTRIL) 20 MG tablet Take 20 mg by mouth daily. 09/26/17   [provider]  metFORMIN (GLUCOPHAGE) 500 MG tablet Take 500 mg by mouth 2 (two) times daily.    [provider]  metoprolol succinate (TOPROL-XL) 50 MG 24 hr tablet Take 1 tablet by mouth daily. 09/26/17   [provider]  omeprazole (PRILOSEC) 20 MG capsule Take 20 mg by mouth daily.    [provider]  PARoxetine (PAXIL) 20 MG tablet Take 20 mg by  mouth daily.    [provider]  simvastatin (ZOCOR) 20 MG tablet Take 20 mg by mouth daily. 09/26/17   [provider]  Vitamin D, Ergocalciferol, (DRISDOL) 50000 units CAPS capsule Take 50,000 Units by mouth every 7 (seven) days.    [provider]    Allergies Patient has no known allergies.    Social History Social History   Tobacco Use  . Smoking status: Never Smoker  . Smokeless tobacco: Never Used  Substance Use Topics  . Alcohol use: No  . Drug use: No    Review of Systems Patient denies headaches, rhinorrhea, blurry vision, numbness, shortness of breath, chest pain, edema, cough, abdominal pain, nausea, vomiting, diarrhea, dysuria, fevers, rashes or hallucinations unless otherwise stated above in HPI. ____________________________________________   PHYSICAL EXAM:  VITAL SIGNS: Vitals:   07/31/18 2000 07/31/18 2138  BP: 120/62 131/62  Pulse: 71 67  Resp: 18 15  Temp:    SpO2: 99% 99%    Constitutional: Alert, pleasant and non toxic appearing Eyes: Conjunctivae are normal.  Head: Atraumatic. Nose: No congestion/rhinnorhea. Mouth/Throat: Mucous membranes are moist.   Neck: No stridor. Painless ROM.  Cardiovascular: Normal rate, regular rhythm. Grossly normal heart sounds.  Good peripheral circulation. Respiratory: Normal respiratory effort.  No retractions. Lungs CTAB. Gastrointestinal: Soft and nontender. No distention. No abdominal bruits. No CVA tenderness. Genitourinary:  Musculoskeletal: No lower extremity tenderness nor edema.  No joint effusions. Neurologic:  No dysarthria, will follow two step commands, light weakness (4/5) to LUE as compared to right.  Trace left facial droop. No new gross focal neurologic deficits are appreciated. Skin:  Skin is warm, dry and intact. No rash noted. Psychiatric: Mood and affect are normal.  ____________________________________________   LABS (all labs ordered are listed, but only abnormal  results are displayed)  Results for orders placed or performed during the hospital encounter of 07/31/18 (from the past 24 hour(s))  Glucose, capillary     Status: Abnormal   Collection Time: 07/31/18  2:54 PM  Result Value Ref Range   Glucose-Capillary 101 (H) 70 - 99 mg/dL  CBC     Status: None   Collection Time: 07/31/18  2:56 PM  Result Value Ref Range   WBC 6.5 4.0 - 10.5 K/uL   RBC 4.00 3.87 - 5.11 MIL/uL   Hemoglobin 12.5 12.0 - 15.0 g/dL   HCT 16.1 09.6 - 04.5 %   MCV 95.0 80.0 - 100.0 fL   MCH 31.3 26.0 - 34.0 pg   MCHC 32.9 30.0 - 36.0 g/dL   RDW 40.9 81.1 - 91.4 %   Platelets 324 150 - 400 K/uL   nRBC 0.0 0.0 - 0.2 %  Comprehensive metabolic panel     Status: Abnormal   Collection Time: 07/31/18  2:56 PM  Result Value Ref Range   Sodium 143 135 - 145 mmol/L   Potassium 4.4 3.5 -  5.1 mmol/L   Chloride 102 98 - 111 mmol/L   CO2 27 22 - 32 mmol/L   Glucose, Bld 134 (H) 70 - 99 mg/dL   BUN 14 8 - 23 mg/dL   Creatinine, Ser 2.53 (H) 0.44 - 1.00 mg/dL   Calcium 9.8 8.9 - 66.4 mg/dL   Total Protein 8.2 (H) 6.5 - 8.1 g/dL   Albumin 4.2 3.5 - 5.0 g/dL   AST 30 15 - 41 U/L   ALT 18 0 - 44 U/L   Alkaline Phosphatase 65 38 - 126 U/L   Total Bilirubin 0.9 0.3 - 1.2 mg/dL   GFR calc non Af Amer 40 (L) >60 mL/min   GFR calc Af Amer 46 (L) >60 mL/min   Anion gap 14 5 - 15  Troponin I - ONCE - STAT     Status: None   Collection Time: 07/31/18  2:56 PM  Result Value Ref Range   Troponin I <0.03 <0.03 ng/mL  Urinalysis, Complete w Microscopic     Status: Abnormal   Collection Time: 07/31/18  6:05 PM  Result Value Ref Range   Color, Urine YELLOW (A) YELLOW   APPearance CLEAR (A) CLEAR   Specific Gravity, Urine 1.017 1.005 - 1.030   pH 5.0 5.0 - 8.0   Glucose, UA NEGATIVE NEGATIVE mg/dL   Hgb urine dipstick NEGATIVE NEGATIVE   Bilirubin Urine NEGATIVE NEGATIVE   Ketones, ur NEGATIVE NEGATIVE mg/dL   Protein, ur NEGATIVE NEGATIVE mg/dL   Nitrite NEGATIVE NEGATIVE    Leukocytes, UA NEGATIVE NEGATIVE   WBC, UA 0-5 0 - 5 WBC/hpf   Bacteria, UA NONE SEEN NONE SEEN   Squamous Epithelial / LPF 0-5 0 - 5   Mucus PRESENT    ____________________________________________  EKG My review and personal interpretation at Time: 14:26   Indication: weakness  Rate: 90  Rhythm: sinus Axis: left Other: inferolateral t wave inversions ____________________________________________  RADIOLOGY  I personally reviewed all radiographic images ordered to evaluate for the above acute complaints and reviewed radiology reports and findings.  These findings were personally discussed with the patient.  Please see medical record for radiology report.  ____________________________________________   PROCEDURES  Procedure(s) performed:  Procedures    Critical Care performed: no ____________________________________________   INITIAL IMPRESSION / ASSESSMENT AND PLAN / ED COURSE  Pertinent labs & imaging results that were available during my care of the patient were reviewed by me and considered in my medical decision making (see chart for details).   DDX: Dehydration, sepsis, pna, uti, hypoglycemia, cva, drug effect, withdrawal, encephalitis   Fizza L Luby is a 84 y.o. who presents to the ED with was as described above.  Patient's presentation complicated by her underlying dementia as well as history of CVA.  Certainly outside of the window for TPA.  Possible metabolic process.  CT imaging will be ordered to evaluate for above differential.  The patient will be placed on continuous pulse oximetry and telemetry for monitoring.  Laboratory evaluation will be sent to evaluate for the above complaints.     Clinical Course as of Jul 31 2202  Mon Jul 31, 2018  [address] with some improvement after IV fluids but still having weakness in her legs.  Based on her history will order MRI to evaluate for acute ischemic change.   [PR]  2132 CTA did show evidence of vertebral  artery dissection in V3.  Raising clear question that some of her more global symptoms are secondary to  this particular in the setting of her chronic left-sided deficits.   [PR]    Clinical Course User Index [PR] Willy Eddyobinson, Mouhamad Teed, MD     As part of my medical decision making, I reviewed the following data within the electronic MEDICAL RECORD NUMBER Nursing notes reviewed and incorporated, Labs reviewed, notes from prior ED visits and [place] Controlled Substance Database   ____________________________________________   FINAL CLINICAL IMPRESSION(S) / ED DIAGNOSES  Final diagnoses:  Weakness  Vertebral artery dissection (HCC)      NEW MEDICATIONS STARTED DURING THIS VISIT:  New Prescriptions   No medications on file     Note:  This document was prepared using Dragon voice recognition software and may include unintentional dictation errors.    Willy Eddyobinson, Keilan Nichol, MD 07/31/18 2204

## 2018-08-01 ENCOUNTER — Observation Stay: Payer: Medicare Other

## 2018-08-01 ENCOUNTER — Other Ambulatory Visit: Payer: Self-pay

## 2018-08-01 ENCOUNTER — Encounter: Payer: Self-pay | Admitting: *Deleted

## 2018-08-01 DIAGNOSIS — I10 Essential (primary) hypertension: Secondary | ICD-10-CM

## 2018-08-01 DIAGNOSIS — I7774 Dissection of vertebral artery: Secondary | ICD-10-CM | POA: Diagnosis not present

## 2018-08-01 DIAGNOSIS — E785 Hyperlipidemia, unspecified: Secondary | ICD-10-CM | POA: Diagnosis not present

## 2018-08-01 DIAGNOSIS — R531 Weakness: Secondary | ICD-10-CM

## 2018-08-01 LAB — GLUCOSE, CAPILLARY
GLUCOSE-CAPILLARY: 121 mg/dL — AB (ref 70–99)
GLUCOSE-CAPILLARY: 181 mg/dL — AB (ref 70–99)
Glucose-Capillary: 139 mg/dL — ABNORMAL HIGH (ref 70–99)
Glucose-Capillary: 126 mg/dL — ABNORMAL HIGH (ref 70–99)
Glucose-Capillary: 173 mg/dL — ABNORMAL HIGH (ref 70–99)

## 2018-08-01 LAB — CBC
HCT: 30.6 % — ABNORMAL LOW (ref 36.0–46.0)
Hemoglobin: 10.1 g/dL — ABNORMAL LOW (ref 12.0–15.0)
MCH: 31.3 pg (ref 26.0–34.0)
MCHC: 33 g/dL (ref 30.0–36.0)
MCV: 94.7 fL (ref 80.0–100.0)
Platelets: 308 10*3/uL (ref 150–400)
RBC: 3.23 MIL/uL — AB (ref 3.87–5.11)
RDW: 11.7 % (ref 11.5–15.5)
WBC: 5.5 10*3/uL (ref 4.0–10.5)
nRBC: 0 % (ref 0.0–0.2)

## 2018-08-01 LAB — LIPID PANEL
CHOL/HDL RATIO: 3 ratio
CHOLESTEROL: 113 mg/dL (ref 0–200)
HDL: 38 mg/dL — ABNORMAL LOW (ref >40)
LDL Cholesterol: 61 mg/dL (ref 0–99)
Triglycerides: 70 mg/dL (ref <150)
VLDL: 14 mg/dL (ref 0–40)

## 2018-08-01 LAB — BASIC METABOLIC PANEL
ANION GAP: 8 (ref 5–15)
BUN: 18 mg/dL (ref 8–23)
CO2: 28 mmol/L (ref 22–32)
Calcium: 9.1 mg/dL (ref 8.9–10.3)
Chloride: 106 mmol/L (ref 98–111)
Creatinine, Ser: 1.1 mg/dL — ABNORMAL HIGH (ref 0.44–1.00)
GFR calc non Af Amer: 44 mL/min — ABNORMAL LOW (ref >60)
GFR, EST AFRICAN AMERICAN: 51 mL/min — AB (ref >60)
Glucose, Bld: 155 mg/dL — ABNORMAL HIGH (ref 70–99)
Potassium: 3.6 mmol/L (ref 3.5–5.1)
Sodium: 142 mmol/L (ref 135–145)

## 2018-08-01 LAB — TSH: TSH: 2.899 u[IU]/mL (ref 0.350–4.500)

## 2018-08-01 LAB — HEMOGLOBIN A1C
HEMOGLOBIN A1C: 6 % — AB (ref 4.8–5.6)
MEAN PLASMA GLUCOSE: 125.5 mg/dL

## 2018-08-01 NOTE — NC FL2 (Signed)
[place] MEDICAID FL2 LEVEL OF CARE SCREENING TOOL     IDENTIFICATION  Patient Name: Jessica Gardner Birthdate: December 09, 1929 Sex: female Admission Date (Current Location): 07/31/2018  [place]   Facility and Address:  [facility], [address], [place], [place] [address]      Provider Number: [id number]  Attending Physician Name and Address:  Auburn BilberryPatel, Shreyang, MD  Relative Name and Phone Number:       Current Level of Care: Hospital Recommended Level of Care: Skilled Nursing Facility Prior Approval Number:    Date Approved/Denied:   PASRR Number: [id number])  Discharge Plan: SNF    Current Diagnoses: Patient Active Problem List   Diagnosis Date Noted  . CAD (coronary artery disease) 07/31/2018  . Vertebral artery dissection (HCC) 07/31/2018  . Presyncope 01/02/2018  . UTI (urinary tract infection) 01/02/2018  . Dehydration 01/02/2018  . HTN (hypertension) 01/02/2018  . Hyperlipidemia 01/02/2018  . Postural dizziness with presyncope 01/02/2018  . Hypokalemia 01/02/2018  . Left leg weakness 09/09/2017  . Diabetes (HCC) 09/09/2017  . Dementia (HCC) 09/09/2017  . History of stroke 09/09/2017  . Acute bronchitis 09/18/2016  . Elevated troponin 09/18/2016  . Anemia 09/18/2016  . Renal insufficiency 09/18/2016  . Generalized weakness 09/18/2016  . Chest pain 09/17/2016    Orientation RESPIRATION BLADDER Height & Weight     Self  Normal Incontinent Weight: 128 lb 8 oz (58.3 kg) Height:  5' (152.4 cm)  BEHAVIORAL SYMPTOMS/MOOD NEUROLOGICAL BOWEL NUTRITION STATUS      Incontinent Diet(Diet: Heart Healthy/ Carb Modified. )  AMBULATORY STATUS COMMUNICATION OF NEEDS Skin   Extensive Assist Verbally Normal                       Personal Care Assistance Level of Assistance  Bathing, Feeding, Dressing Bathing Assistance: Limited assistance Feeding assistance: Independent Dressing Assistance: Limited  assistance     Functional Limitations Info  Sight, Hearing, Speech Sight Info: Impaired Hearing Info: Impaired Speech Info: Adequate    SPECIAL CARE FACTORS FREQUENCY  PT (By licensed PT), OT (By licensed OT)     PT Frequency: (5) OT Frequency: (5)            Contractures      Additional Factors Info  Code Status, Allergies Code Status Info: (DNR ) Allergies Info: (No Known Allergies. )           Current Medications (08/01/2018):  This is the current hospital active medication list Current Facility-Administered Medications  Medication Dose Route Frequency Provider Last Rate Last Dose  . acetaminophen (TYLENOL) tablet 650 mg  650 mg Oral Q6H PRN Oralia ManisWillis, David, MD       Or  . acetaminophen (TYLENOL) suppository 650 mg  650 mg Rectal Q6H PRN Oralia ManisWillis, David, MD      . dipyridamole-aspirin (AGGRENOX) 200-25 MG per 12 hr capsule 1 capsule  1 capsule Oral BID Oralia ManisWillis, David, MD   1 capsule at 08/01/18 1036  . enoxaparin (LOVENOX) injection 30 mg  30 mg Subcutaneous Q24H Oralia ManisWillis, David, MD   30 mg at 08/01/18 0205  . gabapentin (NEURONTIN) capsule 100 mg  100 mg Oral QHS Oralia ManisWillis, David, MD      . insulin aspart (novoLOG) injection 0-5 Units  0-5 Units Subcutaneous QHS Oralia ManisWillis, David, MD      . insulin aspart (novoLOG) injection 0-9 Units  0-9 Units Subcutaneous TID WC Oralia ManisWillis, David, MD   1 Units at  08/01/18 1222  . latanoprost (XALATAN) 0.005 % ophthalmic solution 1 drop  1 drop Both Eyes QHS Oralia Manis, MD   1 drop at 08/01/18 0206  . ondansetron (ZOFRAN) tablet 4 mg  4 mg Oral Q6H PRN Oralia Manis, MD       Or  . ondansetron [facility]) injection 4 mg  4 mg Intravenous Q6H PRN Oralia Manis, MD      . pantoprazole (PROTONIX) EC tablet 40 mg  40 mg Oral Daily Oralia Manis, MD   40 mg at 08/01/18 0837  . PARoxetine (PAXIL) tablet 20 mg  20 mg Oral Daily Oralia Manis, MD   20 mg at 08/01/18 1037  . simvastatin (ZOCOR) tablet 20 mg  20 mg Oral Daily Oralia Manis, MD          Discharge Medications: Please see discharge summary for a list of discharge medications.  Relevant Imaging Results:  Relevant Lab Results:   Additional Information (SSN: [id number])  Tannie Koskela, Darleen Crocker, LCSW

## 2018-08-01 NOTE — Progress Notes (Signed)
[employer] - [place] [facility]                                                                                                                                                                                  Patient Demographics   Jessica Gardner, is a 84 y.o. 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 No fatigue,  postive weakness. No weight gain, no weight loss.  EYES: No blurry or double vision.  ENT: No tinnitus. No postnasal drip. No redness of the oropharynx.  RESPIRATORY: No cough, no wheeze, no hemoptysis. No dyspnea.  CARDIOVASCULAR: No chest pain. No orthopnea. No palpitations. No syncope.  GASTROINTESTINAL: No nausea, no vomiting or diarrhea. No abdominal pain. No melena or hematochezia.  GENITOURINARY: No dysuria or hematuria.  ENDOCRINE: No polyuria or nocturia. No heat or cold intolerance.  HEMATOLOGY: No anemia. No bruising. No bleeding.  INTEGUMENTARY: No rashes. No lesions.  MUSCULOSKELETAL: No arthritis. No swelling. No gout.  NEUROLOGIC: No numbness, tingling, or ataxia. No seizure-type activity.  PSYCHIATRIC: No anxiety. No insomnia. No ADD.    Vitals:   Vitals:   07/31/18 2138 07/31/18 2300 07/31/18 2352 08/01/18 0352  BP: 131/62 (!) 123/58 125/66 (!) 94/50  Pulse: 67 (!) 56 70 81  Resp: 15 (!) 21 17 16   Temp:   97.9 F (36.6 C) 98 F (36.7 C)  TempSrc:   Oral Oral  SpO2: 99% 99% 98% 96%  Weight:   58.3 kg   Height:   5' (1.524 m)     Wt Readings from Last 3 Encounters:  07/31/18 58.3 kg  05/29/18 59.9 kg  05/18/18 61.2 kg     Intake/Output Summary (Last 24 hours) at 08/01/2018  1324 Last data filed at 08/01/2018 0947 Gross per 24 hour  Intake 560 ml  Output -  Net 560 ml    Physical Exam:   GENERAL: Pleasant-appearing in no apparent distress.  HEAD, EYES, EARS, NOSE AND THROAT: Atraumatic, normocephalic. Extraocular muscles are intact. Pupils equal and reactive to light. Sclerae anicteric. No conjunctival injection. No oro-pharyngeal erythema.  NECK: Supple. There is no jugular venous distention. No bruits, no lymphadenopathy, no thyromegaly.  HEART: Regular rate and rhythm,. No murmurs, no rubs, no clicks.  LUNGS: Clear to auscultation bilaterally. No rales or rhonchi. No wheezes.  ABDOMEN: Soft, flat, nontender, nondistended.  Has good bowel sounds. No hepatosplenomegaly appreciated.  EXTREMITIES: No evidence of any cyanosis, clubbing, or peripheral edema.  +2 pedal and radial pulses bilaterally.  NEUROLOGIC: The patient is alert, awake, and oriented x3 with no focal motor or sensory deficits appreciated bilaterally.  SKIN: Moist and warm with no rashes appreciated.  Psych: Not anxious, depressed LN: No inguinal LN enlargement    Antibiotics   Anti-infectives (From admission, onward)   None      Medications   Scheduled Meds: . dipyridamole-aspirin  1 capsule Oral BID  . enoxaparin (LOVENOX) injection  30 mg Subcutaneous Q24H  . gabapentin  100 mg Oral QHS  . insulin aspart  0-5 Units Subcutaneous QHS  . insulin aspart  0-9 Units Subcutaneous TID WC  . latanoprost  1 drop Both Eyes QHS  . pantoprazole  40 mg Oral Daily  . PARoxetine  20 mg Oral Daily  . simvastatin  20 mg Oral Daily   Continuous Infusions: PRN Meds:.acetaminophen **OR** acetaminophen, ondansetron **OR** ondansetron (ZOFRAN) IV   Data Review:   Micro Results No results found for this or any previous visit (from the past 240 hour(s)).  Radiology Reports Ct Angio Head W Or Wo Contrast  Result Date: 07/31/2018 CLINICAL DATA:  Generalized weakness. History of stroke,  dementia, pacemaker. EXAM: CT ANGIOGRAPHY HEAD AND NECK TECHNIQUE: Multidetector CT imaging of the head and neck was performed using the standard protocol during bolus administration of intravenous contrast. Multiplanar CT image reconstructions and MIPs were obtained to evaluate the vascular anatomy. Carotid stenosis measurements (when applicable) are obtained utilizing NASCET criteria, using the distal internal carotid diameter as the denominator. CONTRAST:  75mL OMNIPAQUE IOHEXOL 350 MG/ML SOLN COMPARISON:  CT HEAD July 31, 2018 and carotid ultrasound September 09, 2017. FINDINGS: CTA NECK FINDINGS: AORTIC ARCH: Normal appearance of the thoracic arch, normal branch pattern. Mild calcific atherosclerosis aortic arch. The origins of the innominate, left Common carotid artery and subclavian artery are widely patent. RIGHT CAROTID SYSTEM: Common carotid artery is patent. Mild calcific atherosclerosis of the carotid bifurcation without hemodynamically significant stenosis by NASCET criteria. Patent internal carotid artery. LEFT CAROTID SYSTEM: Common carotid artery is patent. Mild calcific atherosclerosis of the carotid bifurcation without hemodynamically significant stenosis by NASCET criteria. Patent internal carotid artery. VERTEBRAL ARTERIES:RIGHT vertebral artery is dominant. Severe luminal irregularity LEFT vertebral artery with loss of contrast opacification LEFT V3 segment. No discrete dissection flap or pseudoaneurysm. SKELETON: No acute osseous process though bone windows have not been submitted. OTHER NECK: Soft tissues of the neck are nonacute though, not tailored for evaluation. UPPER CHEST: Included lung apices are clear. No superior mediastinal lymphadenopathy. LEFT cardiac pacemaker via subclavian venous approach. CTA HEAD FINDINGS: ANTERIOR CIRCULATION: Patent cervical internal carotid arteries, petrous, cavernous and supra clinoid internal carotid arteries. Severe calcific atherosclerosis carotid  siphons. Patent anterior communicating artery. Patent anterior and middle cerebral arteries. Moderate stenosis RIGHT A2 segment. Moderate bilateral M1 segment stenosis. Moderate stenosis LEFT M2 segment. Moderate stenosis RIGHT M3 segment. No large vessel occlusion, significant stenosis, contrast extravasation or aneurysm. POSTERIOR CIRCULATION: Complete loss of LEFT vertebral artery contrast opacification. Patent RIGHT vertebral arteries mild calcific atherosclerosis. Moderate basilar artery tandem stenosis. Patent posterior cerebral arteries with moderate tandem stenosis. No large vessel occlusion, significant stenosis, contrast extravasation or aneurysm. VENOUS SINUSES: Major dural venous sinuses are patent though not tailored for evaluation on this angiographic examination. ANATOMIC VARIANTS: None. DELAYED PHASE: No abnormal intracranial enhancement. MIP images reviewed. IMPRESSION: CTA NECK: 1.  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 2. No hemodynamically significant stenosis internal carotid arteries. CTA HEAD: 1. Occluded LEFT vertebral artery without reconstitution. 2. No emergent large vessel occlusion. Multifocal moderate cerebral artery stenosis compatible with atherosclerosis. Acute findings discussed with and reconfirmed by Dr.PATRICK ROBINSON on 07/31/2018 at 9:26 pm. Aortic Atherosclerosis (ICD10-I70.0). Electronically Signed   By: Awilda Metro M.D.   On: 07/31/2018 21:27   Ct Head Wo Contrast  Result Date: 07/31/2018 CLINICAL DATA:  84 year old female with generalized weakness and dizziness today. History of prior stroke. EXAM: CT HEAD WITHOUT CONTRAST TECHNIQUE: Contiguous axial images were obtained from the base of the skull through the vertex without intravenous contrast. COMPARISON:  02/21/2018 head CT. FINDINGS: Brain: No intracranial hemorrhage or CT evidence of large acute infarct. Prominent chronic microvascular changes. Global atrophy. Ventricular prominence most  likely related to atrophy rather than hydrocephalus and without change. Aqueduct is patent. Prominent dural calcification lateral to the temporal lobes bilaterally. Meningiomas felt less likely consideration. Findings stable. Vascular: Vascular calcifications.  No acute hyperdense vessel. Skull: No acute abnormality. Sinuses/Orbits: No acute orbital abnormality. Visualized paranasal sinuses clear. Other: Mastoid air cells and middle ear cavities are clear. IMPRESSION: 1. No acute intracranial abnormality noted. 2. Prominent chronic microvascular changes. 3. Global atrophy. Electronically Signed   By: Lacy Duverney M.D.   On: 07/31/2018 18:45   Ct Angio Neck W Or Wo Contrast  Result Date: 07/31/2018 CLINICAL DATA:  Generalized weakness. History of stroke, dementia, pacemaker. EXAM: CT ANGIOGRAPHY HEAD AND NECK TECHNIQUE: Multidetector CT imaging of the head and neck was performed using the standard protocol during bolus administration of intravenous contrast. Multiplanar CT image reconstructions and MIPs were obtained to evaluate the vascular anatomy. Carotid stenosis measurements (when applicable) are obtained utilizing NASCET criteria, using the distal internal carotid diameter as the denominator. CONTRAST:  75mL OMNIPAQUE IOHEXOL 350 MG/ML SOLN COMPARISON:  CT HEAD July 31, 2018 and carotid ultrasound September 09, 2017. FINDINGS: CTA NECK FINDINGS: AORTIC ARCH: Normal appearance of the thoracic arch, normal branch pattern. Mild calcific atherosclerosis aortic arch. The origins of the innominate, left Common carotid artery and subclavian artery are widely patent. RIGHT CAROTID SYSTEM: Common carotid artery is patent. Mild calcific atherosclerosis of the carotid bifurcation without hemodynamically significant stenosis by NASCET criteria. Patent internal carotid artery. LEFT CAROTID SYSTEM: Common carotid artery is patent. Mild calcific atherosclerosis of the carotid bifurcation without hemodynamically  significant stenosis by NASCET criteria. Patent internal carotid artery. VERTEBRAL ARTERIES:RIGHT vertebral artery is dominant. Severe luminal irregularity LEFT vertebral artery with loss of contrast opacification LEFT V3 segment. No discrete dissection flap or pseudoaneurysm. SKELETON: No acute osseous process though bone windows have not been submitted. OTHER NECK: Soft tissues of the neck are nonacute though, not tailored for evaluation. UPPER CHEST: Included lung apices are clear. No superior mediastinal lymphadenopathy. LEFT cardiac pacemaker via subclavian venous approach. CTA HEAD FINDINGS: ANTERIOR CIRCULATION: Patent cervical internal carotid arteries, petrous, cavernous and supra clinoid internal carotid arteries. Severe calcific atherosclerosis carotid siphons. Patent anterior communicating artery. Patent anterior and middle cerebral arteries. Moderate stenosis RIGHT A2 segment. Moderate bilateral M1 segment stenosis. Moderate stenosis LEFT M2 segment. Moderate stenosis RIGHT M3 segment. No large vessel occlusion, significant stenosis, contrast extravasation or aneurysm. POSTERIOR CIRCULATION: Complete loss of LEFT vertebral artery contrast opacification. Patent RIGHT vertebral arteries mild calcific atherosclerosis. Moderate basilar artery tandem stenosis. Patent posterior cerebral arteries with moderate tandem stenosis. No large vessel occlusion, significant stenosis, contrast extravasation or aneurysm. VENOUS SINUSES: Major dural  venous sinuses are patent though not tailored for evaluation on this angiographic examination. ANATOMIC VARIANTS: None. DELAYED PHASE: No abnormal intracranial enhancement. MIP images reviewed. IMPRESSION: CTA NECK: 1.  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 2. No hemodynamically significant stenosis internal carotid arteries. CTA HEAD: 1. Occluded LEFT vertebral artery without reconstitution. 2. No emergent large vessel occlusion. Multifocal moderate  cerebral artery stenosis compatible with atherosclerosis. Acute findings discussed with and reconfirmed by Dr.PATRICK ROBINSON on 07/31/2018 at 9:26 pm. Aortic Atherosclerosis (ICD10-I70.0). Electronically Signed   By: Awilda Metro M.D.   On: 07/31/2018 21:27   Ct Lumbar Spine Wo Contrast  Result Date: 08/01/2018 CLINICAL DATA:  Increased weakness for 2 days. Weakness in bilateral lower extremities EXAM: CT LUMBAR SPINE WITHOUT CONTRAST TECHNIQUE: Multidetector CT imaging of the lumbar spine was performed without intravenous contrast administration. Multiplanar CT image reconstructions were also generated. COMPARISON:  None. FINDINGS: Segmentation: 5 lumbar type vertebrae. Alignment: 2 mm anterolisthesis of L4 on L5. Vertebrae: No acute fracture or focal pathologic process. Generalized osteopenia. Paraspinal and other soft tissues: No acute paraspinal abnormality. Abdominal aortic atherosclerosis. Contrast material in the renal collecting system from recent CT a of head. Disc levels: Disc spaces are maintained. T12-L1: Minimal broad-based disc bulge. Mild bilateral facet arthropathy. L1-L2: Mild broad-based disc bulge. Mild bilateral facet arthropathy. L2-L3: Mild broad-based disc bulge. Mild bilateral facet arthropathy. L3-L4: Broad-based disc bulge.  Mild bilateral facet arthropathy. L4-L5: Broad-based disc bulge. Severe bilateral facet arthropathy with ligamentum flavum infolding. Severe spinal stenosis. No foraminal stenosis. L5-S1: Mild broad-based disc bulge. Severe bilateral facet arthropathy. No foraminal stenosis. IMPRESSION: 1.  No acute osseous injury of the lumbar spine. 2. At L4-5 there is a broad-based disc bulge. Severe bilateral facet arthropathy with ligamentum flavum infolding. Severe spinal stenosis. Electronically Signed   By: Elige Ko   On: 08/01/2018 12:21     CBC Recent Labs  Lab 07/31/18 1456 08/01/18 0609  WBC 6.5 5.5  HGB 12.5 10.1*  HCT 38.0 30.6*  PLT 324 308   MCV 95.0 94.7  MCH 31.3 31.3  MCHC 32.9 33.0  RDW 11.5 11.7    Chemistries  Recent Labs  Lab 07/31/18 1456 08/01/18 0609  NA 143 142  K 4.4 3.6  CL 102 106  CO2 27 28  GLUCOSE 134* 155*  BUN 14 18  CREATININE 1.20* 1.10*  CALCIUM 9.8 9.1  AST 30  --   ALT 18  --   ALKPHOS 65  --   BILITOT 0.9  --    ------------------------------------------------------------------------------------------------------------------ estimated creatinine clearance is 29.3 mL/min (A) (by C-G formula based on SCr of 1.1 mg/dL (H)). ------------------------------------------------------------------------------------------------------------------ No results for input(s): HGBA1C in the last 72 hours. ------------------------------------------------------------------------------------------------------------------ Recent Labs    08/01/18 0609  CHOL 113  HDL 38*  LDLCALC 61  TRIG 70  CHOLHDL 3.0   ------------------------------------------------------------------------------------------------------------------ Recent Labs    08/01/18 0609  TSH 2.899   ------------------------------------------------------------------------------------------------------------------ No results for input(s): VITAMINB12, FOLATE, FERRITIN, TIBC, IRON, RETICCTPCT in the last 72 hours.  Coagulation profile No results for input(s): INR, PROTIME in the last 168 hours.  No results for input(s): DDIMER in the last 72 hours.  Cardiac Enzymes Recent Labs  Lab 07/31/18 1456  TROPONINI <0.03   ------------------------------------------------------------------------------------------------------------------ Invalid input(s): POCBNP    Assessment & Plan  Patient's 84 year old with generalized weakness    Vertebral artery dissection (HCC) -unclear if this is related to her symptoms or not I will ask vascular to see, questionable need  for heparin drip Neurology also has been consulted    Diabetes (HCC) -carb  modified diet with sliding scale insulin coverage    HTN (hypertension) -home dose antihypertensives    CAD (coronary artery disease) -continue home meds    Hyperlipidemia -Home dose antilipid  Generalized weakness PT evaluation     Code Status Orders  (From admission, onward)         Start     Ordered   07/31/18 2352  Full code  Continuous     07/31/18 2351        Code Status History    Date Active Date Inactive Code Status Order ID Comments User Context   01/02/2018 0126 01/03/2018 1721 Full Code [id number]  Rometta Emery, MD ED   09/09/2017 0146 09/13/2017 1942 Full Code [id number]  Oralia Manis, MD Inpatient   09/17/2016 1557 09/18/2016 1728 Full Code [id number]  Katha Hamming, MD ED    Advance Directive Documentation     Most Recent Value  Type of Advance Directive  Healthcare Power of Attorney  Pre-existing out of facility DNR order (yellow form or pink MOST form)  -  "MOST" Form in Place?  -           Consults  Neuro, vascular    DVT Prophylaxis  Lovenox   Lab Results  Component Value Date   PLT 308 08/01/2018     Time Spent in minutes  Greater than 50% of time spent in care coordination and counseling patient regarding the condition and plan of care.   Auburn Bilberry M.D on 08/01/2018 at 1:24 PM  Between 7am to 6pm - Pager - [phone]  After 6pm go to [email or web address] - Social research officer, government  [employer]   Office  [phone]

## 2018-08-01 NOTE — Evaluation (Signed)
Physical Therapy Evaluation Patient Details Name: Jessica Gardner Sole MRN: [id number] DOB: March 27, 1930 Today's Date: 08/01/2018   History of Present Illness  84 year old female with a past medical history of diabetes, dementia, hypertension, hyperlipidemia, coronary artery disease status post MI / pace maker placement, anemia, renal insufficiency, history of CVA with residual left-sided weakness.  She presented to the ED with bilateral lower extremity weakness, changes in speech, increased confusion.  Clinical Impression  Pt showed good effort with PT though she was weak and clearly struggled with ambulation.  She overall showed good effort with bed mobility and did not need a lot of assist, and was even able to get to standing slowly but w/o direct assist.  However her ambulation was very limited and she struggled to advance Gardner LE very well, was unable to get hips square to walker/direction of movement (swayed Gardner) and needed min to at times max assist to insure that she stayed inside the walker and upright.  Pt is far from her baseline (where she can walk to the bathroom with supervision, or even manage steps if she needs to get out of the home) and even with 24/7 assist would have a hard time with her current status.  PT recommending STR to build strength, mobility, balance, back to baseline.     Follow Up Recommendations SNF    Equipment Recommendations  None recommended by PT    Recommendations for Other Services       Precautions / Restrictions Precautions Precautions: Fall Restrictions Weight Bearing Restrictions: No      Mobility  Bed Mobility Overal bed mobility: Needs Assistance Bed Mobility: Supine to Sit     Supine to sit: Min assist     General bed mobility comments: Pt showed great effort with getting to EOB, needed only light assist to get to upright and hips to EOB  Transfers Overall transfer level: Needs assistance Equipment used: Rolling walker (2  wheeled) Transfers: Sit to/from Stand Sit to Stand: Min guard         General transfer comment: Pt needed cues for hand placement and sequencing, but was actually able to slowly rise with great effort.   Ambulation/Gait Ambulation/Gait assistance: Max assist;Mod assist Gait Distance (Feet): 6 Feet Assistive device: Rolling walker (2 wheeled)       General Gait Details: Pt showed very good effort with ambulation but struggled to advance Gardner LE, was unable to get her hips/feet square to the walker (b/Gardner toes pointing ~20 degrees to the Gardner) and heavy reliance on the walker.  Pt needed constant assist to keep her upright and at times required considerably heavy assist to stay in the walker and upright   Stairs            Wheelchair Mobility    Modified Rankin (Stroke Patients Only)       Balance Overall balance assessment: Needs assistance Sitting-balance support: Bilateral upper extremity supported Sitting balance-Leahy Scale: Fair Sitting balance - Comments: heavy reliance on UEs, but able to maintain balance   Standing balance support: Bilateral upper extremity supported Standing balance-Leahy Scale: Poor Standing balance comment: Pt leaning heavily on the walker and at times losing balance to the R                             Pertinent Vitals/Pain Pain Assessment: No/denies pain    Home Living Family/patient expects to be discharged to:: Skilled nursing facility  Prior Function Level of Independence: Needs assistance   Gait / Transfers Assistance Needed: used Rollator when walking in the house; would use wheelchair when leaving home for doctor appointments etc     Comments: Pt has an aide t/o the day, son with her at night, unsure the level of assist he is able to provide     Hand Dominance        Extremity/Trunk Assessment   Upper Extremity Assessment Upper Extremity Assessment: Generalized weakness(R UE grossly  4-/5, Gardner UE grossly 3/5, poor coordination b/Gardner)    Lower Extremity Assessment Lower Extremity Assessment: Generalized weakness(grossly 3+ to 4-/5 t/o )       Communication   Communication: HOH  Cognition Arousal/Alertness: Awake/alert Behavior During Therapy: (pleasant but slightly confused) Overall Cognitive Status: Impaired/Different from baseline(per family report she is more confused than normal)                                 General Comments: Pt knew it was Nov 2019 and that she was at [facility] Comments      Exercises     Assessment/Plan    PT Assessment Patient needs continued PT services  PT Problem List Decreased strength;Decreased range of motion;Decreased activity tolerance;Decreased balance;Decreased mobility;Decreased coordination;Decreased cognition;Decreased knowledge of use of DME;Decreased safety awareness;Decreased knowledge of precautions       PT Treatment Interventions Gait training;Stair training;Functional mobility training;Therapeutic activities;Therapeutic exercise;Balance training;Neuromuscular re-education;Cognitive remediation;Patient/family education;DME instruction    PT Goals (Current goals can be found in the Care Plan section)  Acute Rehab PT Goals Patient Stated Goal: family hoping for her to get stronger at rehab PT Goal Formulation: With patient Time For Goal Achievement: 08/15/18 Potential to Achieve Goals: Fair    Frequency Min 2X/week   Barriers to discharge        Co-evaluation               AM-PAC PT "6 Clicks" Daily Activity  Outcome Measure Difficulty turning over in bed (including adjusting bedclothes, sheets and blankets)?: A Little Difficulty moving from lying on back to sitting on the side of the bed? : Unable Difficulty sitting down on and standing up from a chair with arms (e.g., wheelchair, bedside commode, etc,.)?: A Lot Help needed moving to and from a bed to chair  (including a wheelchair)?: A Little Help needed walking in hospital room?: A Lot Help needed climbing 3-5 steps with a railing? : Total 6 Click Score: 12    End of Session Equipment Utilized During Treatment: Gait belt Activity Tolerance: Patient tolerated treatment well;Patient limited by fatigue Patient left: with chair alarm set;with call bell/phone within reach Nurse Communication: Mobility status PT Visit Diagnosis: Muscle weakness (generalized) (M62.81);Difficulty in walking, not elsewhere classified (R26.2)    Time: 3244-0102 PT Time Calculation (min) (ACUTE ONLY): 28 min   Charges:   PT Evaluation $PT Eval Low Complexity: 1 Low PT Treatments $Gait Training: 8-22 mins        Malachi Pro, DPT 08/01/2018, 5:34 PM

## 2018-08-01 NOTE — Consult Note (Signed)
Referring Physician:     Chief Complaint: Generalized weakness and altered mental status  HPI: Jessica Gardner is an 84 y.o. female pertinent history of type 2 diabetes mellitus, CKD stage III, CVA with residual left side weakness, hypertension, hyperlipidemia, obstructive sleep apnea, peripheral neuropathy, anemia, myocardial infarction, and sick sinus syndrome status post permanent pacemaker placement presenting to the ED on 07/31/2018 with complaints of generalized weakness and altered mental status.  Per patient's caregiver, patient was not to be confused and not acting her usual self at around 2 am yesterday.  She however refused to go to the ED for further evaluation.  Per patient's caregiver, when she arrived in the morning patient was still in bed and not her usual self, she appeared weak and disoriented.  She was unable to get out of bed and appeared to have not ate or drank anything since yesterday. Caregiver report that she normaly uses a wheelchair or walker for ambulation but was unable to transfer to the wheelchair that morning.  Patient did not complain of any headache, speech difficulty, numbness, no cranial nerve deficit noted, nausea or vomiting, seizure or seizure-like activity, or other focal neurologic deficit.  At that time the caregiver got concerned called patient daughter who advised her to take patient to the ED for further evaluation of a possible stroke.  On arrival to the ED patient had work-up including CT head which showed no acute intracranial abnormality.  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  Due to history of stroke and abnormal CTA head neck patient was admitted for further evaluation.  Date last known well: Date: 07/31/2018 Time last known well: Time: 02:00 tPA Given: No: Outside time window   Past Medical History:  Diagnosis Date  . Dementia (HCC)   .  Diabetes mellitus   . MI (myocardial infarction) (HCC)   . Pacemaker   . Stroke [facility](HCC)     Past Surgical History:  Procedure Laterality Date  . PACEMAKER INSERTION      Family History  Problem Relation Age of Onset  . Breast cancer Sister [id number]   Social History:  reports that she has never smoked. She has never used smokeless tobacco. She reports that she does not drink alcohol or use drugs.  Allergies: No Known Allergies  Medications:  I have reviewed the patient's current medications. Prior to Admission:  Medications Prior to Admission  Medication Sig Dispense Refill Last Dose  . acetaminophen (TYLENOL) 325 MG tablet Take 2 tablets (650 mg total) by mouth every 4 (four) hours as needed for mild pain (or temp > 37.5 C (99.5 F)).   Past Month at PRN  . amLODipine (NORVASC) 5 MG tablet Take 5 mg by mouth daily.   05/18/2018 at 0800  . bimatoprost (LUMIGAN) 0.01 % SOLN Place 1 drop into both eyes 2 (two) times daily.   05/18/2018 at 0800  . dipyridamole-aspirin (AGGRENOX) 200-25 MG 12hr capsule Take 1 capsule by mouth 2 (two) times daily.   05/18/2018 at 0800  . Fe Fum-FePoly-FA-Vit C-Vit B3 (INTEGRA F) 125-1 MG CAPS Take 1 capsule by mouth daily.   05/18/2018 at 0800  . fluconazole (DIFLUCAN) 150 MG tablet Take 1 tablet (150 mg total) by mouth daily. 1 tablet 0   . gabapentin (NEURONTIN) 100 MG capsule Take 100 mg by mouth at bedtime.   05/17/2018 at 2100  . glipiZIDE (GLUCOTROL XL) 5 MG 24 hr tablet  Take 1 tablet by mouth daily.   05/18/2018 at 0800  . Ipratropium-Albuterol (COMBIVENT RESPIMAT) 20-100 MCG/ACT AERS respimat Inhale 1 puff into the lungs every 6 (six) hours. 1 Inhaler 5 Unknown at Unknown  . latanoprost (XALATAN) 0.005 % ophthalmic solution Place 1 drop into both eyes at bedtime.   05/17/2018 at 2100  . lisinopril (PRINIVIL,ZESTRIL) 20 MG tablet Take 20 mg by mouth daily.   05/18/2018 at 0800  . metFORMIN (GLUCOPHAGE) 500 MG tablet Take 500 mg by mouth 2 (two) times daily.   05/18/2018 at  0800  . metoprolol succinate (TOPROL-XL) 50 MG 24 hr tablet Take 1 tablet by mouth daily.   05/18/2018 at 0800  . omeprazole (PRILOSEC) 20 MG capsule Take 20 mg by mouth daily.   05/18/2018 at 0800  . PARoxetine (PAXIL) 20 MG tablet Take 20 mg by mouth daily.   05/18/2018 at 0800  . simvastatin (ZOCOR) 20 MG tablet Take 20 mg by mouth daily.   05/18/2018 at 0800  . Vitamin D, Ergocalciferol, (DRISDOL) 50000 units CAPS capsule Take 50,000 Units by mouth every 7 (seven) days.   As directed at As directed   Scheduled: . dipyridamole-aspirin  1 capsule Oral BID  . enoxaparin (LOVENOX) injection  30 mg Subcutaneous Q24H  . gabapentin  100 mg Oral QHS  . insulin aspart  0-5 Units Subcutaneous QHS  . insulin aspart  0-9 Units Subcutaneous TID WC  . latanoprost  1 drop Both Eyes QHS  . pantoprazole  40 mg Oral Daily  . PARoxetine  20 mg Oral Daily  . simvastatin  20 mg Oral Daily    ROS: History obtained from the patient   General ROS: negative for - chills, fatigue, fever, night sweats, weight gain or weight loss Psychological ROS: negative for - behavioral disorder, hallucinations, memory difficulties, mood swings or suicidal ideation Ophthalmic ROS: negative for - blurry vision, double vision, eye pain or loss of vision ENT ROS: negative for - epistaxis, nasal discharge, oral lesions, sore throat, tinnitus or vertigo Allergy and Immunology ROS: negative for - hives or itchy/watery eyes Hematological and Lymphatic ROS: negative for - bleeding problems, bruising or swollen lymph nodes Endocrine ROS: negative for - galactorrhea, hair pattern changes, polydipsia/polyuria or temperature intolerance Respiratory ROS: negative for - cough, hemoptysis, shortness of breath or wheezing Cardiovascular ROS: negative for - chest pain, dyspnea on exertion, edema or irregular heartbeat Gastrointestinal ROS: negative for - abdominal pain, diarrhea, hematemesis, nausea/vomiting or stool incontinence Genito-Urinary  ROS: negative for - dysuria, hematuria, incontinence or urinary frequency/urgency Musculoskeletal ROS: negative for - joint swelling or muscular weakness Neurological ROS: as noted in HPI Dermatological ROS: negative for rash and skin lesion changes  Physical Examination: Blood pressure (!) 94/50, pulse 81, temperature 98 F (36.7 C), temperature source Oral, resp. rate 16, height 5' (1.524 m), weight 58.3 kg, SpO2 96 %.   HEENT-  Normocephalic, no lesions, without obvious abnormality.  Normal external eye and conjunctiva.  Normal TM's bilaterally.  Normal auditory canals and external ears. Normal external nose, mucus membranes and septum.  Normal pharynx. Cardiovascular- S1, S2 normal, pulses palpable throughout   Lungs- chest clear, no wheezing, rales, normal symmetric air entry Abdomen- soft, non-tender; bowel sounds normal; no masses,  no organomegaly Extremities- no edema Lymph-no adenopathy palpable Musculoskeletal-no joint tenderness, deformity or swelling Skin-warm and dry, no hyperpigmentation, vitiligo, or suspicious lesions  Neurological Exam   Mental Status: Alert, oriented, thought content appropriate.  Speech fluent without evidence of aphasia.  Able to follow 3 step commands without difficulty. Attention span and concentration seemed appropriate  Cranial Nerves: II: Discs flat bilaterally; Visual fields grossly normal, pupils equal, round, reactive to light and accommodation III,IV, VI: ptosis not present, extra-ocular motions intact bilaterally V,VII: right facial droop, facial light touch sensation intact VIII: hearing normal bilaterally IX,X: gag reflex present XI: bilateral shoulder shrug XII: midline tongue extension Motor: Right :  Upper extremity   5/5 Without pronator drift      Left: Upper extremity   4/5 with drift Right:   Lower extremity   4+/5 able to break gravity                                         Left: Lower extremity   4+/5 able to break  gravity Tone and bulk:normal tone throughout; no atrophy noted Sensory: Pinprick and light touch intact bilaterally Deep Tendon Reflexes: 2+ and symmetric throughout Plantars: Right: mute                              Left: mute Cerebellar: Finger-to-nose testing intact bilaterally.  Gait: not tested due to safety concerns  Data Reviewed  Laboratory Studies:  Basic Metabolic Panel: Recent Labs  Lab 07/31/18 1456 08/01/18 0609  NA 143 142  K 4.4 3.6  CL 102 106  CO2 27 28  GLUCOSE 134* 155*  BUN 14 18  CREATININE 1.20* 1.10*  CALCIUM 9.8 9.1    Liver Function Tests: Recent Labs  Lab 07/31/18 1456  AST 30  ALT 18  ALKPHOS 65  BILITOT 0.9  PROT 8.2*  ALBUMIN 4.2   No results for input(s): LIPASE, AMYLASE in the last 168 hours. No results for input(s): AMMONIA in the last 168 hours.  CBC: Recent Labs  Lab 07/31/18 1456 08/01/18 0609  WBC 6.5 5.5  HGB 12.5 10.1*  HCT 38.0 30.6*  MCV 95.0 94.7  PLT 324 308    Cardiac Enzymes: Recent Labs  Lab 07/31/18 1456  TROPONINI <0.03    BNP: Invalid input(s): POCBNP  CBG: Recent Labs  Lab 07/31/18 1454 08/01/18 0151 08/01/18 0742  GLUCAP 101* 139* 121*    Microbiology: Results for orders placed or performed during the hospital encounter of 05/18/18  Urine culture     Status: Abnormal   Collection Time: 05/18/18  6:26 PM  Result Value Ref Range Status   Specimen Description   Final    URINE, RANDOM Performed at [facility], [address], [place], [place] [address]    Special Requests   Final    NONE Performed at [facility], [address] [place], [place] [address]    Culture MULTIPLE SPECIES PRESENT, SUGGEST RECOLLECTION (A)  Final   Report Status 05/20/2018 FINAL  Final    Coagulation Studies: No results for input(s): LABPROT, INR in the last 72 hours.  Urinalysis:  Recent Labs  Lab 07/31/18 1805  COLORURINE YELLOW*  LABSPEC 1.017  PHURINE 5.0  GLUCOSEU  NEGATIVE  HGBUR NEGATIVE  BILIRUBINUR NEGATIVE  KETONESUR NEGATIVE  PROTEINUR NEGATIVE  NITRITE NEGATIVE  LEUKOCYTESUR NEGATIVE    Lipid Panel:    Component Value Date/Time   CHOL 120 09/09/2017 0410   TRIG 117 09/09/2017 0410   HDL 37 (L) 09/09/2017 0410   CHOLHDL 3.2 09/09/2017 0410   VLDL 23 09/09/2017 0410  LDLCALC 60 09/09/2017 0410    HgbA1C:  Lab Results  Component Value Date   HGBA1C 5.6 01/02/2018    Urine Drug Screen:      Component Value Date/Time   LABOPIA NONE DETECTED 01/01/2018 2333   COCAINSCRNUR NONE DETECTED 01/01/2018 2333   LABBENZ NONE DETECTED 01/01/2018 2333   AMPHETMU NONE DETECTED 01/01/2018 2333   THCU NONE DETECTED 01/01/2018 2333   LABBARB NONE DETECTED 01/01/2018 2333    Alcohol Level: No results for input(s): ETH in the last 168 hours.  Other results: EKG: normal EKG, normal sinus rhythm, unchanged from previous tracings, RBBB, 1st degree AV block. Vent. rate 89 BPM PR interval 216 ms QRS duration 136 ms QT/QTc 406/493 ms P-R-T axes 60 -67 -52  Imaging: Ct Angio Head W Or Wo Contrast  Result Date: 07/31/2018 CLINICAL DATA:  Generalized weakness. History of stroke, dementia, pacemaker. EXAM: CT ANGIOGRAPHY HEAD AND NECK TECHNIQUE: Multidetector CT imaging of the head and neck was performed using the standard protocol during bolus administration of intravenous contrast. Multiplanar CT image reconstructions and MIPs were obtained to evaluate the vascular anatomy. Carotid stenosis measurements (when applicable) are obtained utilizing NASCET criteria, using the distal internal carotid diameter as the denominator. CONTRAST:  75mL OMNIPAQUE IOHEXOL 350 MG/ML SOLN COMPARISON:  CT HEAD July 31, 2018 and carotid ultrasound September 09, 2017. FINDINGS: CTA NECK FINDINGS: AORTIC ARCH: Normal appearance of the thoracic arch, normal branch pattern. Mild calcific atherosclerosis aortic arch. The origins of the innominate, left Common carotid  artery and subclavian artery are widely patent. RIGHT CAROTID SYSTEM: Common carotid artery is patent. Mild calcific atherosclerosis of the carotid bifurcation without hemodynamically significant stenosis by NASCET criteria. Patent internal carotid artery. LEFT CAROTID SYSTEM: Common carotid artery is patent. Mild calcific atherosclerosis of the carotid bifurcation without hemodynamically significant stenosis by NASCET criteria. Patent internal carotid artery. VERTEBRAL ARTERIES:RIGHT vertebral artery is dominant. Severe luminal irregularity LEFT vertebral artery with loss of contrast opacification LEFT V3 segment. No discrete dissection flap or pseudoaneurysm. SKELETON: No acute osseous process though bone windows have not been submitted. OTHER NECK: Soft tissues of the neck are nonacute though, not tailored for evaluation. UPPER CHEST: Included lung apices are clear. No superior mediastinal lymphadenopathy. LEFT cardiac pacemaker via subclavian venous approach. CTA HEAD FINDINGS: ANTERIOR CIRCULATION: Patent cervical internal carotid arteries, petrous, cavernous and supra clinoid internal carotid arteries. Severe calcific atherosclerosis carotid siphons. Patent anterior communicating artery. Patent anterior and middle cerebral arteries. Moderate stenosis RIGHT A2 segment. Moderate bilateral M1 segment stenosis. Moderate stenosis LEFT M2 segment. Moderate stenosis RIGHT M3 segment. No large vessel occlusion, significant stenosis, contrast extravasation or aneurysm. POSTERIOR CIRCULATION: Complete loss of LEFT vertebral artery contrast opacification. Patent RIGHT vertebral arteries mild calcific atherosclerosis. Moderate basilar artery tandem stenosis. Patent posterior cerebral arteries with moderate tandem stenosis. No large vessel occlusion, significant stenosis, contrast extravasation or aneurysm. VENOUS SINUSES: Major dural venous sinuses are patent though not tailored for evaluation on this angiographic  examination. ANATOMIC VARIANTS: None. DELAYED PHASE: No abnormal intracranial enhancement. MIP images reviewed. IMPRESSION: CTA NECK: 1.  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 2. No hemodynamically significant stenosis internal carotid arteries. CTA HEAD: 1. Occluded LEFT vertebral artery without reconstitution. 2. No emergent large vessel occlusion. Multifocal moderate cerebral artery stenosis compatible with atherosclerosis. Acute findings discussed with and reconfirmed by Dr.PATRICK ROBINSON on 07/31/2018 at 9:26 pm. Aortic Atherosclerosis (ICD10-I70.0). Electronically Signed   By: Awilda Metro M.D.   On: 07/31/2018 21:27  Ct Head Wo Contrast  Result Date: 07/31/2018 CLINICAL DATA:  84 year old female with generalized weakness and dizziness today. History of prior stroke. EXAM: CT HEAD WITHOUT CONTRAST TECHNIQUE: Contiguous axial images were obtained from the base of the skull through the vertex without intravenous contrast. COMPARISON:  02/21/2018 head CT. FINDINGS: Brain: No intracranial hemorrhage or CT evidence of large acute infarct. Prominent chronic microvascular changes. Global atrophy. Ventricular prominence most likely related to atrophy rather than hydrocephalus and without change. Aqueduct is patent. Prominent dural calcification lateral to the temporal lobes bilaterally. Meningiomas felt less likely consideration. Findings stable. Vascular: Vascular calcifications.  No acute hyperdense vessel. Skull: No acute abnormality. Sinuses/Orbits: No acute orbital abnormality. Visualized paranasal sinuses clear. Other: Mastoid air cells and middle ear cavities are clear. IMPRESSION: 1. No acute intracranial abnormality noted. 2. Prominent chronic microvascular changes. 3. Global atrophy. Electronically Signed   By: Lacy Duverney M.D.   On: 07/31/2018 18:45   Ct Angio Neck W Or Wo Contrast  Result Date: 07/31/2018 CLINICAL DATA:  Generalized weakness. History of stroke,  dementia, pacemaker. EXAM: CT ANGIOGRAPHY HEAD AND NECK TECHNIQUE: Multidetector CT imaging of the head and neck was performed using the standard protocol during bolus administration of intravenous contrast. Multiplanar CT image reconstructions and MIPs were obtained to evaluate the vascular anatomy. Carotid stenosis measurements (when applicable) are obtained utilizing NASCET criteria, using the distal internal carotid diameter as the denominator. CONTRAST:  75mL OMNIPAQUE IOHEXOL 350 MG/ML SOLN COMPARISON:  CT HEAD July 31, 2018 and carotid ultrasound September 09, 2017. FINDINGS: CTA NECK FINDINGS: AORTIC ARCH: Normal appearance of the thoracic arch, normal branch pattern. Mild calcific atherosclerosis aortic arch. The origins of the innominate, left Common carotid artery and subclavian artery are widely patent. RIGHT CAROTID SYSTEM: Common carotid artery is patent. Mild calcific atherosclerosis of the carotid bifurcation without hemodynamically significant stenosis by NASCET criteria. Patent internal carotid artery. LEFT CAROTID SYSTEM: Common carotid artery is patent. Mild calcific atherosclerosis of the carotid bifurcation without hemodynamically significant stenosis by NASCET criteria. Patent internal carotid artery. VERTEBRAL ARTERIES:RIGHT vertebral artery is dominant. Severe luminal irregularity LEFT vertebral artery with loss of contrast opacification LEFT V3 segment. No discrete dissection flap or pseudoaneurysm. SKELETON: No acute osseous process though bone windows have not been submitted. OTHER NECK: Soft tissues of the neck are nonacute though, not tailored for evaluation. UPPER CHEST: Included lung apices are clear. No superior mediastinal lymphadenopathy. LEFT cardiac pacemaker via subclavian venous approach. CTA HEAD FINDINGS: ANTERIOR CIRCULATION: Patent cervical internal carotid arteries, petrous, cavernous and supra clinoid internal carotid arteries. Severe calcific atherosclerosis carotid  siphons. Patent anterior communicating artery. Patent anterior and middle cerebral arteries. Moderate stenosis RIGHT A2 segment. Moderate bilateral M1 segment stenosis. Moderate stenosis LEFT M2 segment. Moderate stenosis RIGHT M3 segment. No large vessel occlusion, significant stenosis, contrast extravasation or aneurysm. POSTERIOR CIRCULATION: Complete loss of LEFT vertebral artery contrast opacification. Patent RIGHT vertebral arteries mild calcific atherosclerosis. Moderate basilar artery tandem stenosis. Patent posterior cerebral arteries with moderate tandem stenosis. No large vessel occlusion, significant stenosis, contrast extravasation or aneurysm. VENOUS SINUSES: Major dural venous sinuses are patent though not tailored for evaluation on this angiographic examination. ANATOMIC VARIANTS: None. DELAYED PHASE: No abnormal intracranial enhancement. MIP images reviewed. IMPRESSION: CTA NECK: 1.  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 2. No hemodynamically significant stenosis internal carotid arteries. CTA HEAD: 1. Occluded LEFT vertebral artery without reconstitution. 2. No emergent large vessel occlusion. Multifocal moderate cerebral artery stenosis compatible with atherosclerosis. Acute  findings discussed with and reconfirmed by Dr.PATRICK ROBINSON on 07/31/2018 at 9:26 pm. Aortic Atherosclerosis (ICD10-I70.0). Electronically Signed   By: Awilda Metro M.D.   On: 07/31/2018 21:27   Patient seen and examined.  Clinical course and management discussed.  Necessary edits performed.  I agree with the above.  Assessment and plan of care developed and discussed below.  Assessment: 84 y.o. female with pertinent history of type 2 diabetes mellitus, CKD stage III, CVA with residual left side weakness, hypertension, hyperlipidemia, obstructive sleep apnea, peripheral neuropathy, anemia, myocardial infarction, and sick sinus syndrome status post permanent pacemaker placement presenting  with  complaints of generalized weakness, dizziness and alteration of awareness. Symptoms now improved. Patient alert and able to feed herself which is at baseline per caregiver. CT head reviewed and shows no acute intracranial abnormality.  Unable to obtain MRI of the brain due to pacemaker.  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 No emergent large vessel occlusion noted. This is not likely the cause of her generalized symptoms but on review of her old records there is concern for an embolic cause to her chronic infarcts.  Unclear if patient may have had a small infarct or shower of small infarcts.  Further work up recommended.  Due to prominent lower extremity symptoms will rule out spinal stenosis as well.  Patient wheelchair bound with ability to assist with transfers at baseline.    Stroke Risk Factors - diabetes mellitus, hyperlipidemia and hypertension  Plan: 1. HgbA1c, fasting lipid panel, TSH 2. PT consult 3. Echocardiogram  4. CT lumbar spine without contrast 5. Prophylactic therapy-Antiplatelet med: continue home dose of Aggrenox 6. NPO until RN stroke swallow screen 7. Telemetry monitoring 8. Frequent neuro checks 9. Will need outpatient prolonged cardiac monitoring for evaluation of occult afib.  This patient was staffed with Dr. Verlon Au, Thad Ranger who personally evaluated patient, reviewed documentation and agreed with assessment and plan of care as above.  Webb Silversmith, DNP, FNP-BC Board certified Nurse Practitioner Neurology Department  08/01/2018, 9:42 AM  Thana Farr, MD Neurology [phone]  08/01/2018  2:57 PM

## 2018-08-01 NOTE — Progress Notes (Signed)
PT Cancellation Note  Patient Details Name: Jessica Gardner Rix MRN: [id number] DOB: 1930/02/28   Cancelled Treatment:    Reason Eval/Treat Not Completed: Medical issues which prohibited therapy: Attending physician placed consult to vascular surgery secondary to vertebral artery dissection with occlusion.  Will hold PT eval awaiting results and recommendations once vascular surgery consult is completed.     Ovidio Hanger. Scott Avenir Lozinski PT, DPT 08/01/18, 1:09 PM

## 2018-08-01 NOTE — Care Management Note (Signed)
Case Management Note  Patient Details  Name: Jessica Gardner MRN: [id number] Date of Birth: 1930/08/13  Subjective/Objective:   Admitted to [facility] under observation with the diagnosis of vertebral artery dissection. Jessica Gardner lives in the home. Daughter is Jessica Gardner [phone]). [facility]  Sees Dr. Marcello FennelHande as primary care physician.  PACE stopped 2 weeks ago. [facility] 09/2017. No home oxygen. Rolling Walker and wheelchair  in the home. Paid care giver Arlyn DunningLinda Gattis Monday Friday, 8:00am-6:00pm.  No falls Good appetite.                   Action/Plan:  Will continue to follow for discharge plans   Expected Discharge Date:  08/03/18               Expected Discharge Plan:     In-House Referral:   yes  Discharge planning Services   yes  Post Acute Care Choice:    Choice offered to:     DME Arranged:    DME Agency:     HH Arranged:    HH Agency:     Status of Service:     If discussed at [employer] Meetings, dates discussed:    Additional Comments:  Gwenette GreetBrenda S Okey Zelek, RN MSN CCM Care Management [phone] 08/01/2018, 10:08 AM

## 2018-08-01 NOTE — Progress Notes (Signed)
Advanced care plan.  Purpose of the Encounter: CODE STATUS  Parties in Attendance: Patient herself and her caregiver  Patient's Decision Capacity: Intact  Subjective/Patient's story: Patient is a 84 year old with previous history of coronary artery disease diabetes and stroke who was admitted with a vertebral artery dissection.   Objective/Medical story  I discussed with the patient in front of her caregiver regarding her desires for cardiac and pulmonary resuscitation.  Patient states based on her advanced age she does not want any cardiac or pulmonary resuscitation and would like to pass away peacefully in the event that her heart stops or she develops any type of significant lung issues.   Goals of care determination:  DNR   CODE STATUS: DNR   Time spent discussing advanced care planning: 16 minutes

## 2018-08-01 NOTE — Consult Note (Signed)
[facility] Vascular Consult Note  MRN : [id number]  Jessica Gardner is a 84 y.o. (November 26, 1929) female who presents with chief complaint of  Chief Complaint  Patient presents with  . Weakness   History of Present Illness: The patient is an 84 year old female with a past medical history of diabetes, dementia, hypertension, hyperlipidemia, coronary artery disease status post MI / pace maker placement, anemia, renal insufficiency, history of CVA with residual left-sided weakness who is wheelchair bound who presented to the [facility] emergency department with bilateral lower extremity weakness.  Patient was interviewed and examined with family member at bedside.  Patient and family member note that the patient felt abnormally "weak" yesterday and has been diagnosed with a "stroke" in the past which prompted her to seek medical attention.  The family member also notes that the patient was that not acting "normal" seem to be more "confused".  Patient denies any pain.  Patient denies any chest pain shortness of breath.  Patient still feels "weak".  Patient denies any visual disturbance or headache.  Patient denies any fever, nausea vomiting.  Patient denies any pain with urination.  During her emergency room work-up the patient underwent a CTA of the head and neck which was notable for "1.  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 2. No hemodynamically significant stenosis internal carotid arteries. 1. Occluded LEFT vertebral artery without reconstitution. 2. No emergent large vessel occlusion. Multifocal moderate cerebral artery stenosis compatible with atherosclerosis.  Vascular surgery was consulted by Dr. Allena Katz for further recommendations.  Current Facility-Administered Medications  Medication Dose Route Frequency Provider Last Rate Last Dose  . acetaminophen (TYLENOL) tablet 650 mg  650 mg Oral Q6H PRN Oralia Manis, MD       Or   . acetaminophen (TYLENOL) suppository 650 mg  650 mg Rectal Q6H PRN Oralia Manis, MD      . dipyridamole-aspirin (AGGRENOX) 200-25 MG per 12 hr capsule 1 capsule  1 capsule Oral BID Oralia Manis, MD   1 capsule at 08/01/18 1036  . enoxaparin (LOVENOX) injection 30 mg  30 mg Subcutaneous Q24H Oralia Manis, MD   30 mg at 08/01/18 0205  . gabapentin (NEURONTIN) capsule 100 mg  100 mg Oral QHS Oralia Manis, MD      . insulin aspart (novoLOG) injection 0-5 Units  0-5 Units Subcutaneous QHS Oralia Manis, MD      . insulin aspart (novoLOG) injection 0-9 Units  0-9 Units Subcutaneous TID WC Oralia Manis, MD   1 Units at 08/01/18 1222  . latanoprost (XALATAN) 0.005 % ophthalmic solution 1 drop  1 drop Both Eyes QHS Oralia Manis, MD   1 drop at 08/01/18 0206  . ondansetron (ZOFRAN) tablet 4 mg  4 mg Oral Q6H PRN Oralia Manis, MD       Or  . ondansetron [facility]) injection 4 mg  4 mg Intravenous Q6H PRN Oralia Manis, MD      . pantoprazole (PROTONIX) EC tablet 40 mg  40 mg Oral Daily Oralia Manis, MD   40 mg at 08/01/18 0837  . PARoxetine (PAXIL) tablet 20 mg  20 mg Oral Daily Oralia Manis, MD   20 mg at 08/01/18 1037  . simvastatin (ZOCOR) tablet 20 mg  20 mg Oral Daily Oralia Manis, MD       Past Medical History:  Diagnosis Date  . Dementia (HCC)   . Diabetes mellitus   . MI (myocardial infarction) (HCC)   .  Pacemaker   . Stroke [facility](HCC)    Past Surgical History:  Procedure Laterality Date  . PACEMAKER INSERTION     Social History Social History   Tobacco Use  . Smoking status: Never Smoker  . Smokeless tobacco: Never Used  Substance Use Topics  . Alcohol use: No  . Drug use: No   Family History Family History  Problem Relation Age of Onset  . Breast cancer Sister [id number]  Patient denies any family history of peripheral artery disease, venous disease or renal disease.  No Known Allergies  REVIEW OF SYSTEMS (Negative unless checked)  Constitutional: [] Weight loss  [] Fever   [] Chills Cardiac: [] Chest pain   [] Chest pressure   [] Palpitations   [] Shortness of breath when laying flat   [] Shortness of breath at rest   [] Shortness of breath with exertion. Vascular:  [] Pain in legs with walking   [] Pain in legs at rest   [] Pain in legs when laying flat   [] Claudication   [] Pain in feet when walking  [] Pain in feet at rest  [] Pain in feet when laying flat   [] History of DVT   [] Phlebitis   [] Swelling in legs   [] Varicose veins   [] Non-healing ulcers Pulmonary:   [] Uses home oxygen   [] Productive cough   [] Hemoptysis   [] Wheeze  [] COPD   [] Asthma Neurologic:  [] Dizziness  [] Blackouts   [] Seizures   [x] History of stroke   [] History of TIA  [] Aphasia   [] Temporary blindness   [] Dysphagia   [] Weakness or numbness in arms   [x] Weakness or numbness in legs Musculoskeletal:  [] Arthritis   [] Joint swelling   [] Joint pain   [] Low back pain Hematologic:  [] Easy bruising  [] Easy bleeding   [] Hypercoagulable state   [] Anemic  [] Hepatitis Gastrointestinal:  [] Blood in stool   [] Vomiting blood  [] Gastroesophageal reflux/heartburn   [] Difficulty swallowing. Genitourinary:  [x] Chronic kidney disease   [] Difficult urination  [] Frequent urination  [] Burning with urination   [] Blood in urine Skin:  [] Rashes   [] Ulcers   [] Wounds Psychological:  [] History of anxiety   []  History of major depression.  Physical Examination  Vitals:   07/31/18 2138 07/31/18 2300 07/31/18 2352 08/01/18 0352  BP: 131/62 (!) 123/58 125/66 (!) 94/50  Pulse: 67 (!) 56 70 81  Resp: 15 (!) 21 17 16   Temp:   97.9 F (36.6 C) 98 F (36.7 C)  TempSrc:   Oral Oral  SpO2: 99% 99% 98% 96%  Weight:   58.3 kg   Height:   5' (1.524 m)    Body mass index is 25.1 kg/m. Gen:  WD/WN, NAD, No facial droop Head: [place]/AT, No temporalis wasting. Prominent temp pulse not noted. Ear/Nose/Throat: Hearing grossly intact, nares w/o erythema or drainage, oropharynx w/o Erythema/Exudate Eyes: Sclera non-icteric, conjunctiva  clear Neck: Trachea midline.  No JVD.  Pulmonary:  Good air movement, respirations not labored, equal bilaterally.  Cardiac: RRR, normal S1, S2. Vascular:  Vessel Right Left  Radial Palpable Palpable  Ulnar Palpable Palpable  Brachial Palpable Palpable  Carotid Palpable, without bruit Palpable, without bruit  Aorta Not palpable N/A                   Gastrointestinal: soft, non-tender/non-distended. No guarding/reflex.  Musculoskeletal: M/S left sided weakness. Extremities without ischemic changes.  No deformity or atrophy. Minimal edema. Neurologic: Sensation grossly intact in extremities.  Symmetrical.  Speech is fluent. Motor exam as listed above. Psychiatric: Judgment intact, Mood & affect appropriate for pt's clinical  situation. Dermatologic: No rashes or ulcers noted.  No cellulitis or open wounds. Lymph : No Cervical, Axillary, or Inguinal lymphadenopathy.  CBC Lab Results  Component Value Date   WBC 5.5 08/01/2018   HGB 10.1 (L) 08/01/2018   HCT 30.6 (L) 08/01/2018   MCV 94.7 08/01/2018   PLT 308 08/01/2018   BMET    Component Value Date/Time   NA 142 08/01/2018 0609   NA 142 08/27/2012 1135   K 3.6 08/01/2018 0609   K 3.7 08/27/2012 1135   CL 106 08/01/2018 0609   CL 109 (H) 08/27/2012 1135   CO2 28 08/01/2018 0609   CO2 28 08/27/2012 1135   GLUCOSE 155 (H) 08/01/2018 0609   GLUCOSE 125 (H) 08/27/2012 1135   BUN 18 08/01/2018 0609   BUN 10 08/27/2012 1135   CREATININE 1.10 (H) 08/01/2018 0609   CREATININE 0.78 08/27/2012 1135   CALCIUM 9.1 08/01/2018 0609   CALCIUM 9.5 08/27/2012 1135   GFRNONAA 44 (L) 08/01/2018 0609   GFRNONAA >60 08/27/2012 1135   GFRAA 51 (L) 08/01/2018 0609   GFRAA >60 08/27/2012 1135   Estimated Creatinine Clearance: 29.3 mL/min (A) (by C-G formula based on SCr of 1.1 mg/dL (H)).  COAG Lab Results  Component Value Date   INR 1.03 01/01/2018   Radiology Ct Angio Head W Or Wo Contrast  Result Date: 07/31/2018 CLINICAL  DATA:  Generalized weakness. History of stroke, dementia, pacemaker. EXAM: CT ANGIOGRAPHY HEAD AND NECK TECHNIQUE: Multidetector CT imaging of the head and neck was performed using the standard protocol during bolus administration of intravenous contrast. Multiplanar CT image reconstructions and MIPs were obtained to evaluate the vascular anatomy. Carotid stenosis measurements (when applicable) are obtained utilizing NASCET criteria, using the distal internal carotid diameter as the denominator. CONTRAST:  75mL OMNIPAQUE IOHEXOL 350 MG/ML SOLN COMPARISON:  CT HEAD July 31, 2018 and carotid ultrasound September 09, 2017. FINDINGS: CTA NECK FINDINGS: AORTIC ARCH: Normal appearance of the thoracic arch, normal branch pattern. Mild calcific atherosclerosis aortic arch. The origins of the innominate, left Common carotid artery and subclavian artery are widely patent. RIGHT CAROTID SYSTEM: Common carotid artery is patent. Mild calcific atherosclerosis of the carotid bifurcation without hemodynamically significant stenosis by NASCET criteria. Patent internal carotid artery. LEFT CAROTID SYSTEM: Common carotid artery is patent. Mild calcific atherosclerosis of the carotid bifurcation without hemodynamically significant stenosis by NASCET criteria. Patent internal carotid artery. VERTEBRAL ARTERIES:RIGHT vertebral artery is dominant. Severe luminal irregularity LEFT vertebral artery with loss of contrast opacification LEFT V3 segment. No discrete dissection flap or pseudoaneurysm. SKELETON: No acute osseous process though bone windows have not been submitted. OTHER NECK: Soft tissues of the neck are nonacute though, not tailored for evaluation. UPPER CHEST: Included lung apices are clear. No superior mediastinal lymphadenopathy. LEFT cardiac pacemaker via subclavian venous approach. CTA HEAD FINDINGS: ANTERIOR CIRCULATION: Patent cervical internal carotid arteries, petrous, cavernous and supra clinoid internal carotid  arteries. Severe calcific atherosclerosis carotid siphons. Patent anterior communicating artery. Patent anterior and middle cerebral arteries. Moderate stenosis RIGHT A2 segment. Moderate bilateral M1 segment stenosis. Moderate stenosis LEFT M2 segment. Moderate stenosis RIGHT M3 segment. No large vessel occlusion, significant stenosis, contrast extravasation or aneurysm. POSTERIOR CIRCULATION: Complete loss of LEFT vertebral artery contrast opacification. Patent RIGHT vertebral arteries mild calcific atherosclerosis. Moderate basilar artery tandem stenosis. Patent posterior cerebral arteries with moderate tandem stenosis. No large vessel occlusion, significant stenosis, contrast extravasation or aneurysm. VENOUS SINUSES: Major dural venous sinuses are patent though not tailored for  evaluation on this angiographic examination. ANATOMIC VARIANTS: None. DELAYED PHASE: No abnormal intracranial enhancement. MIP images reviewed. IMPRESSION: CTA NECK: 1.  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 2. No hemodynamically significant stenosis internal carotid arteries. CTA HEAD: 1. Occluded LEFT vertebral artery without reconstitution. 2. No emergent large vessel occlusion. Multifocal moderate cerebral artery stenosis compatible with atherosclerosis. Acute findings discussed with and reconfirmed by Dr.PATRICK ROBINSON on 07/31/2018 at 9:26 pm. Aortic Atherosclerosis (ICD10-I70.0). Electronically Signed   By: Awilda Metro M.D.   On: 07/31/2018 21:27   Ct Head Wo Contrast  Result Date: 07/31/2018 CLINICAL DATA:  84 year old female with generalized weakness and dizziness today. History of prior stroke. EXAM: CT HEAD WITHOUT CONTRAST TECHNIQUE: Contiguous axial images were obtained from the base of the skull through the vertex without intravenous contrast. COMPARISON:  02/21/2018 head CT. FINDINGS: Brain: No intracranial hemorrhage or CT evidence of large acute infarct. Prominent chronic microvascular  changes. Global atrophy. Ventricular prominence most likely related to atrophy rather than hydrocephalus and without change. Aqueduct is patent. Prominent dural calcification lateral to the temporal lobes bilaterally. Meningiomas felt less likely consideration. Findings stable. Vascular: Vascular calcifications.  No acute hyperdense vessel. Skull: No acute abnormality. Sinuses/Orbits: No acute orbital abnormality. Visualized paranasal sinuses clear. Other: Mastoid air cells and middle ear cavities are clear. IMPRESSION: 1. No acute intracranial abnormality noted. 2. Prominent chronic microvascular changes. 3. Global atrophy. Electronically Signed   By: Lacy Duverney M.D.   On: 07/31/2018 18:45   Ct Angio Neck W Or Wo Contrast  Result Date: 07/31/2018 CLINICAL DATA:  Generalized weakness. History of stroke, dementia, pacemaker. EXAM: CT ANGIOGRAPHY HEAD AND NECK TECHNIQUE: Multidetector CT imaging of the head and neck was performed using the standard protocol during bolus administration of intravenous contrast. Multiplanar CT image reconstructions and MIPs were obtained to evaluate the vascular anatomy. Carotid stenosis measurements (when applicable) are obtained utilizing NASCET criteria, using the distal internal carotid diameter as the denominator. CONTRAST:  75mL OMNIPAQUE IOHEXOL 350 MG/ML SOLN COMPARISON:  CT HEAD July 31, 2018 and carotid ultrasound September 09, 2017. FINDINGS: CTA NECK FINDINGS: AORTIC ARCH: Normal appearance of the thoracic arch, normal branch pattern. Mild calcific atherosclerosis aortic arch. The origins of the innominate, left Common carotid artery and subclavian artery are widely patent. RIGHT CAROTID SYSTEM: Common carotid artery is patent. Mild calcific atherosclerosis of the carotid bifurcation without hemodynamically significant stenosis by NASCET criteria. Patent internal carotid artery. LEFT CAROTID SYSTEM: Common carotid artery is patent. Mild calcific atherosclerosis of  the carotid bifurcation without hemodynamically significant stenosis by NASCET criteria. Patent internal carotid artery. VERTEBRAL ARTERIES:RIGHT vertebral artery is dominant. Severe luminal irregularity LEFT vertebral artery with loss of contrast opacification LEFT V3 segment. No discrete dissection flap or pseudoaneurysm. SKELETON: No acute osseous process though bone windows have not been submitted. OTHER NECK: Soft tissues of the neck are nonacute though, not tailored for evaluation. UPPER CHEST: Included lung apices are clear. No superior mediastinal lymphadenopathy. LEFT cardiac pacemaker via subclavian venous approach. CTA HEAD FINDINGS: ANTERIOR CIRCULATION: Patent cervical internal carotid arteries, petrous, cavernous and supra clinoid internal carotid arteries. Severe calcific atherosclerosis carotid siphons. Patent anterior communicating artery. Patent anterior and middle cerebral arteries. Moderate stenosis RIGHT A2 segment. Moderate bilateral M1 segment stenosis. Moderate stenosis LEFT M2 segment. Moderate stenosis RIGHT M3 segment. No large vessel occlusion, significant stenosis, contrast extravasation or aneurysm. POSTERIOR CIRCULATION: Complete loss of LEFT vertebral artery contrast opacification. Patent RIGHT vertebral arteries mild calcific atherosclerosis. Moderate basilar artery  tandem stenosis. Patent posterior cerebral arteries with moderate tandem stenosis. No large vessel occlusion, significant stenosis, contrast extravasation or aneurysm. VENOUS SINUSES: Major dural venous sinuses are patent though not tailored for evaluation on this angiographic examination. ANATOMIC VARIANTS: None. DELAYED PHASE: No abnormal intracranial enhancement. MIP images reviewed. IMPRESSION: CTA NECK: 1.  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 2. No hemodynamically significant stenosis internal carotid arteries. CTA HEAD: 1. Occluded LEFT vertebral artery without reconstitution. 2. No emergent  large vessel occlusion. Multifocal moderate cerebral artery stenosis compatible with atherosclerosis. Acute findings discussed with and reconfirmed by Dr.PATRICK ROBINSON on 07/31/2018 at 9:26 pm. Aortic Atherosclerosis (ICD10-I70.0). Electronically Signed   By: Awilda Metro M.D.   On: 07/31/2018 21:27   Ct Lumbar Spine Wo Contrast  Result Date: 08/01/2018 CLINICAL DATA:  Increased weakness for 2 days. Weakness in bilateral lower extremities EXAM: CT LUMBAR SPINE WITHOUT CONTRAST TECHNIQUE: Multidetector CT imaging of the lumbar spine was performed without intravenous contrast administration. Multiplanar CT image reconstructions were also generated. COMPARISON:  None. FINDINGS: Segmentation: 5 lumbar type vertebrae. Alignment: 2 mm anterolisthesis of L4 on L5. Vertebrae: No acute fracture or focal pathologic process. Generalized osteopenia. Paraspinal and other soft tissues: No acute paraspinal abnormality. Abdominal aortic atherosclerosis. Contrast material in the renal collecting system from recent CT a of head. Disc levels: Disc spaces are maintained. T12-L1: Minimal broad-based disc bulge. Mild bilateral facet arthropathy. L1-L2: Mild broad-based disc bulge. Mild bilateral facet arthropathy. L2-L3: Mild broad-based disc bulge. Mild bilateral facet arthropathy. L3-L4: Broad-based disc bulge.  Mild bilateral facet arthropathy. L4-L5: Broad-based disc bulge. Severe bilateral facet arthropathy with ligamentum flavum infolding. Severe spinal stenosis. No foraminal stenosis. L5-S1: Mild broad-based disc bulge. Severe bilateral facet arthropathy. No foraminal stenosis. IMPRESSION: 1.  No acute osseous injury of the lumbar spine. 2. At L4-5 there is a broad-based disc bulge. Severe bilateral facet arthropathy with ligamentum flavum infolding. Severe spinal stenosis. 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 Wyn Quaker.  Patent bilateral internal carotid arteries and patent right vertebral artery.  Unilateral vertebral occlusion unlikely the cause of the patient's bilateral lower extremity weakness.  At this time, vascular surgery does not recommend any endovascular/open intervention.  Agree with continuing antiplatelet medication/therapy.  At this time, the patient does not need to follow-up with [place] as an outpatient.  At this time, vascular surgery will sign off. 2.  Hyperlipidemia: On ASA and statin. Encouraged good control as its slows the progression of atherosclerotic disease 3.  Hypertension: On appropriate medications. Encouraged good control as its slows the progression of atherosclerotic and renal disease.  Discussed with Dr. Weldon Inches, PA-C  08/01/2018 2:06 PM  This note was created with Dragon medical transcription system.  Any error is purely unintentional.

## 2018-08-01 NOTE — Care Management Obs Status (Signed)
MEDICARE OBSERVATION STATUS NOTIFICATION   Patient Details  Name: Sheppard Plumberrlania L Freel MRN: [id number] Date of Birth: Aug 02, 1930   Medicare Observation Status Notification Given:  Yes    Gwenette GreetBrenda S Vernal Hritz, RN 08/01/2018, 9:56 AM

## 2018-08-02 ENCOUNTER — Observation Stay
Admit: 2018-08-02 | Discharge: 2018-08-02 | Disposition: A | Discharge status: Home / Self Care | Payer: Medicare Other | Attending: Nurse Practitioner | Admitting: Nurse Practitioner

## 2018-08-02 DIAGNOSIS — I7774 Dissection of vertebral artery: Secondary | ICD-10-CM | POA: Diagnosis not present

## 2018-08-02 LAB — GLUCOSE, CAPILLARY
GLUCOSE-CAPILLARY: 172 mg/dL — AB (ref 70–99)
GLUCOSE-CAPILLARY: 134 mg/dL — AB (ref 70–99)
Glucose-Capillary: 131 mg/dL — ABNORMAL HIGH (ref 70–99)
Glucose-Capillary: 262 mg/dL — ABNORMAL HIGH (ref 70–99)

## 2018-08-02 NOTE — Clinical Social Work Note (Signed)
Clinical Social Work Assessment  Patient Details  Name: Jessica Gardner MRN: [id number] Date of Birth: 12-11-1929  Date of referral:  08/02/18               Reason for consult:  Facility Placement                Permission sought to share information with:  Family Supports, Magazine features editoracility Contact Representative Permission granted to share information::  Yes, Verbal Permission Granted  Name::     [facility],Angela Daughter [phone] or Jerral BonitoGattis, Linda Other   [phone]   Agency::  SNF admissions  Relationship::     Contact Information:     Housing/Transportation Living arrangements for the past 2 months:  Single Family Home Source of Information:  Adult Children, Other (Comment Required)(Patient has a caregiver who was at bedside.) Patient Interpreter Needed:  None Criminal Activity/Legal Involvement Pertinent to Current Situation/Hospitalization:  No - Comment as needed Significant Relationships:  Adult Children, Community Support Lives with:  Adult Children Do you feel safe going back to the place where you live?  No Need for family participation in patient care:  Yes (Comment)  Care giving concerns:  Patient's family feels she needs short term rehab before she is able to return back home.   Social Worker assessment / plan:  Patient is an 84 year old female who is alert and oriented x2.  Patient was sleeping, caregiver was at bedside, CSW spoke to caregiver to complete assessment.  Patient lives with her daughter, and has a caregiver who assists with ADLs.  [facility] in the past, and is familiar with facility and process, however patient's daughter would [facility] to see how patient does, and also it is closer to where patient lives.  CSW explained how insurance will pay for stay and what to expect, CSW also discussed how insurance has to approve patient to go to SNF.  Patient's family and caregiver did not have any other  questions or concerns and gave CSW permission to [place]  Employment status:  Retired Health and safety inspectornsurance information:  Managed Medicare PT Recommendations:  Skilled Nursing Facility Information / Referral to community resources:  Skilled Nursing Facility  Patient/Family's Response to care:  Patient's family is in agreement to having patient go to SNF for short term rehab.  Patient/Family's Understanding of and Emotional Response to Diagnosis, Current Treatment, and Prognosis: Patient's family is aware of current treatment plan, and prognosis.  Emotional Assessment Appearance:  Appears stated age Attitude/Demeanor/Rapport:    Affect (typically observed):  Appropriate, Calm, Quiet, Stable Orientation:  Oriented to Self, Oriented to Place Alcohol / Substance use:  Not Applicable Psych involvement (Current and /or in the community):  No (Comment)  Discharge Needs  Concerns to be addressed:  Care Coordination, Lack of Support, Cognitive Concerns Readmission within the last 30 days:  No Current discharge risk:  Lack of support system Barriers to Discharge:  English as a second language teachernsurance Authorization, Continued Medical Work up   [place] [employer], Torri Michalski R, LCSWA 08/02/2018, 5:28 PM

## 2018-08-02 NOTE — Progress Notes (Addendum)
Subjective: Patient awake and alert this morning. No complaints today.  Patient states that she is feeling fine.  No neurological changes reported overnight.  Objective: Current vital signs: BP (!) 121/58 (BP Location: Left Arm)   Pulse 81   Temp (!) 97.3 F (36.3 C) (Oral)   Resp 18   Ht 5' (1.524 m)   Wt 58.3 kg   SpO2 99%   BMI 25.10 kg/m  Vital signs in last 24 hours: Temp:  [97.3 F (36.3 C)-99.1 F (37.3 C)] 97.3 F (36.3 C) (11/20 0852) Pulse Rate:  [77-88] 81 (11/20 0852) Resp:  [18-20] 18 (11/20 0852) BP: (96-121)/(48-58) 121/58 (11/20 0852) SpO2:  [95 %-99 %] 99 % (11/20 0852)  Intake/Output from previous day: 11/19 0701 - 11/20 0700 In: 480 [P.O.:480] Out: 500 [Urine:500] Intake/Output this shift: No intake/output data recorded. Nutritional status:  Diet Order            Diet heart healthy/carb modified Room service appropriate? Yes; Fluid consistency: Thin  Diet effective now             Neurological Exam  Mental Status: Alert, oriented, thought content appropriate. Speech fluent without evidence of aphasia. Able to follow 3 step commands without difficulty. Attention span and concentration seemed appropriate  Cranial Nerves: II: Discs flat bilaterally; Visual fields grossly normal, pupils equal, round, reactive to light and accommodation III,IV, VI: ptosis not present, extra-ocular motions intact bilaterally V,VII: right facial droop, facial light touch sensationintact VIII: hearing normal bilaterally IX,X: gag reflex present XI: bilateral shoulder shrug XII: midline tongue extension Motor: Right :Upper extremity 5/5Without pronator driftLeft: Upper extremity 4/5 with drift Right:Lower extremity 4+/5able to break gravityLeft: Lower extremity 4+/5 able to break gravity Tone and bulk:normal tone throughout; no atrophy noted Sensory: Pinprick and light touchintact bilaterally Deep  Tendon Reflexes: 2+ and symmetric throughout Plantars: Right:muteLeft: mute Cerebellar: Finger-to-nosetesting intact bilaterally. Gait: not tested due to safety concerns  Data Reviewed  Lab Results: Basic Metabolic Panel: Recent Labs  Lab 07/31/18 1456 08/01/18 0609  NA 143 142  K 4.4 3.6  CL 102 106  CO2 27 28  GLUCOSE 134* 155*  BUN 14 18  CREATININE 1.20* 1.10*  CALCIUM 9.8 9.1   Liver Function Tests: Recent Labs  Lab 07/31/18 1456  AST 30  ALT 18  ALKPHOS 65  BILITOT 0.9  PROT 8.2*  ALBUMIN 4.2   No results for input(s): LIPASE, AMYLASE in the last 168 hours. No results for input(s): AMMONIA in the last 168 hours.  CBC: Recent Labs  Lab 07/31/18 1456 08/01/18 0609  WBC 6.5 5.5  HGB 12.5 10.1*  HCT 38.0 30.6*  MCV 95.0 94.7  PLT 324 308    Cardiac Enzymes: Recent Labs  Lab 07/31/18 1456  TROPONINI <0.03    Lipid Panel: Recent Labs  Lab 08/01/18 0609  CHOL 113  TRIG 70  HDL 38*  CHOLHDL 3.0  VLDL 14  LDLCALC 61    CBG: Recent Labs  Lab 08/01/18 0742 08/01/18 1141 08/01/18 1635 08/01/18 2052 08/02/18 0736  GLUCAP 121* 126* 181* 173* 131*    Microbiology: Results for orders placed or performed during the hospital encounter of 05/18/18  Urine culture     Status: Abnormal   Collection Time: 05/18/18  6:26 PM  Result Value Ref Range Status   Specimen Description   Final    URINE, RANDOM Performed at [facility], [address] [place], [place] [id number]    Special Requests  Final    NONE Performed at [facility], [address] [place], [place] [address]    Culture MULTIPLE SPECIES PRESENT, SUGGEST RECOLLECTION (A)  Final   Report Status 05/20/2018 FINAL  Final    Coagulation Studies: No results for input(s): LABPROT, INR in the last 72 hours.  Imaging: Ct Angio Head W Or Wo Contrast  Result Date: 07/31/2018 CLINICAL DATA:  Generalized weakness. History of  stroke, dementia, pacemaker. EXAM: CT ANGIOGRAPHY HEAD AND NECK TECHNIQUE: Multidetector CT imaging of the head and neck was performed using the standard protocol during bolus administration of intravenous contrast. Multiplanar CT image reconstructions and MIPs were obtained to evaluate the vascular anatomy. Carotid stenosis measurements (when applicable) are obtained utilizing NASCET criteria, using the distal internal carotid diameter as the denominator. CONTRAST:  75mL OMNIPAQUE IOHEXOL 350 MG/ML SOLN COMPARISON:  CT HEAD July 31, 2018 and carotid ultrasound September 09, 2017. FINDINGS: CTA NECK FINDINGS: AORTIC ARCH: Normal appearance of the thoracic arch, normal branch pattern. Mild calcific atherosclerosis aortic arch. The origins of the innominate, left Common carotid artery and subclavian artery are widely patent. RIGHT CAROTID SYSTEM: Common carotid artery is patent. Mild calcific atherosclerosis of the carotid bifurcation without hemodynamically significant stenosis by NASCET criteria. Patent internal carotid artery. LEFT CAROTID SYSTEM: Common carotid artery is patent. Mild calcific atherosclerosis of the carotid bifurcation without hemodynamically significant stenosis by NASCET criteria. Patent internal carotid artery. VERTEBRAL ARTERIES:RIGHT vertebral artery is dominant. Severe luminal irregularity LEFT vertebral artery with loss of contrast opacification LEFT V3 segment. No discrete dissection flap or pseudoaneurysm. SKELETON: No acute osseous process though bone windows have not been submitted. OTHER NECK: Soft tissues of the neck are nonacute though, not tailored for evaluation. UPPER CHEST: Included lung apices are clear. No superior mediastinal lymphadenopathy. LEFT cardiac pacemaker via subclavian venous approach. CTA HEAD FINDINGS: ANTERIOR CIRCULATION: Patent cervical internal carotid arteries, petrous, cavernous and supra clinoid internal carotid arteries. Severe calcific atherosclerosis  carotid siphons. Patent anterior communicating artery. Patent anterior and middle cerebral arteries. Moderate stenosis RIGHT A2 segment. Moderate bilateral M1 segment stenosis. Moderate stenosis LEFT M2 segment. Moderate stenosis RIGHT M3 segment. No large vessel occlusion, significant stenosis, contrast extravasation or aneurysm. POSTERIOR CIRCULATION: Complete loss of LEFT vertebral artery contrast opacification. Patent RIGHT vertebral arteries mild calcific atherosclerosis. Moderate basilar artery tandem stenosis. Patent posterior cerebral arteries with moderate tandem stenosis. No large vessel occlusion, significant stenosis, contrast extravasation or aneurysm. VENOUS SINUSES: Major dural venous sinuses are patent though not tailored for evaluation on this angiographic examination. ANATOMIC VARIANTS: None. DELAYED PHASE: No abnormal intracranial enhancement. MIP images reviewed. IMPRESSION: CTA NECK: 1.  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 2. No hemodynamically significant stenosis internal carotid arteries. CTA HEAD: 1. Occluded LEFT vertebral artery without reconstitution. 2. No emergent large vessel occlusion. Multifocal moderate cerebral artery stenosis compatible with atherosclerosis. Acute findings discussed with and reconfirmed by Dr.PATRICK ROBINSON on 07/31/2018 at 9:26 pm. Aortic Atherosclerosis (ICD10-I70.0). Electronically Signed   By: Awilda Metro M.D.   On: 07/31/2018 21:27   Ct Head Wo Contrast  Result Date: 07/31/2018 CLINICAL DATA:  84 year old female with generalized weakness and dizziness today. History of prior stroke. EXAM: CT HEAD WITHOUT CONTRAST TECHNIQUE: Contiguous axial images were obtained from the base of the skull through the vertex without intravenous contrast. COMPARISON:  02/21/2018 head CT. FINDINGS: Brain: No intracranial hemorrhage or CT evidence of large acute infarct. Prominent chronic microvascular changes. Global atrophy. Ventricular  prominence most likely related to  atrophy rather than hydrocephalus and without change. Aqueduct is patent. Prominent dural calcification lateral to the temporal lobes bilaterally. Meningiomas felt less likely consideration. Findings stable. Vascular: Vascular calcifications.  No acute hyperdense vessel. Skull: No acute abnormality. Sinuses/Orbits: No acute orbital abnormality. Visualized paranasal sinuses clear. Other: Mastoid air cells and middle ear cavities are clear. IMPRESSION: 1. No acute intracranial abnormality noted. 2. Prominent chronic microvascular changes. 3. Global atrophy. Electronically Signed   By: Lacy Duverney M.D.   On: 07/31/2018 18:45   Ct Angio Neck W Or Wo Contrast  Result Date: 07/31/2018 CLINICAL DATA:  Generalized weakness. History of stroke, dementia, pacemaker. EXAM: CT ANGIOGRAPHY HEAD AND NECK TECHNIQUE: Multidetector CT imaging of the head and neck was performed using the standard protocol during bolus administration of intravenous contrast. Multiplanar CT image reconstructions and MIPs were obtained to evaluate the vascular anatomy. Carotid stenosis measurements (when applicable) are obtained utilizing NASCET criteria, using the distal internal carotid diameter as the denominator. CONTRAST:  75mL OMNIPAQUE IOHEXOL 350 MG/ML SOLN COMPARISON:  CT HEAD July 31, 2018 and carotid ultrasound September 09, 2017. FINDINGS: CTA NECK FINDINGS: AORTIC ARCH: Normal appearance of the thoracic arch, normal branch pattern. Mild calcific atherosclerosis aortic arch. The origins of the innominate, left Common carotid artery and subclavian artery are widely patent. RIGHT CAROTID SYSTEM: Common carotid artery is patent. Mild calcific atherosclerosis of the carotid bifurcation without hemodynamically significant stenosis by NASCET criteria. Patent internal carotid artery. LEFT CAROTID SYSTEM: Common carotid artery is patent. Mild calcific atherosclerosis of the carotid bifurcation without  hemodynamically significant stenosis by NASCET criteria. Patent internal carotid artery. VERTEBRAL ARTERIES:RIGHT vertebral artery is dominant. Severe luminal irregularity LEFT vertebral artery with loss of contrast opacification LEFT V3 segment. No discrete dissection flap or pseudoaneurysm. SKELETON: No acute osseous process though bone windows have not been submitted. OTHER NECK: Soft tissues of the neck are nonacute though, not tailored for evaluation. UPPER CHEST: Included lung apices are clear. No superior mediastinal lymphadenopathy. LEFT cardiac pacemaker via subclavian venous approach. CTA HEAD FINDINGS: ANTERIOR CIRCULATION: Patent cervical internal carotid arteries, petrous, cavernous and supra clinoid internal carotid arteries. Severe calcific atherosclerosis carotid siphons. Patent anterior communicating artery. Patent anterior and middle cerebral arteries. Moderate stenosis RIGHT A2 segment. Moderate bilateral M1 segment stenosis. Moderate stenosis LEFT M2 segment. Moderate stenosis RIGHT M3 segment. No large vessel occlusion, significant stenosis, contrast extravasation or aneurysm. POSTERIOR CIRCULATION: Complete loss of LEFT vertebral artery contrast opacification. Patent RIGHT vertebral arteries mild calcific atherosclerosis. Moderate basilar artery tandem stenosis. Patent posterior cerebral arteries with moderate tandem stenosis. No large vessel occlusion, significant stenosis, contrast extravasation or aneurysm. VENOUS SINUSES: Major dural venous sinuses are patent though not tailored for evaluation on this angiographic examination. ANATOMIC VARIANTS: None. DELAYED PHASE: No abnormal intracranial enhancement. MIP images reviewed. IMPRESSION: CTA NECK: 1.  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 2. No hemodynamically significant stenosis internal carotid arteries. CTA HEAD: 1. Occluded LEFT vertebral artery without reconstitution. 2. No emergent large vessel occlusion. Multifocal  moderate cerebral artery stenosis compatible with atherosclerosis. Acute findings discussed with and reconfirmed by Dr.PATRICK ROBINSON on 07/31/2018 at 9:26 pm. Aortic Atherosclerosis (ICD10-I70.0). Electronically Signed   By: Awilda Metro M.D.   On: 07/31/2018 21:27   Ct Lumbar Spine Wo Contrast  Result Date: 08/01/2018 CLINICAL DATA:  Increased weakness for 2 days. Weakness in bilateral lower extremities EXAM: CT LUMBAR SPINE WITHOUT CONTRAST TECHNIQUE: Multidetector CT imaging of the lumbar spine was performed without intravenous contrast administration. Multiplanar  CT image reconstructions were also generated. COMPARISON:  None. FINDINGS: Segmentation: 5 lumbar type vertebrae. Alignment: 2 mm anterolisthesis of L4 on L5. Vertebrae: No acute fracture or focal pathologic process. Generalized osteopenia. Paraspinal and other soft tissues: No acute paraspinal abnormality. Abdominal aortic atherosclerosis. Contrast material in the renal collecting system from recent CT a of head. Disc levels: Disc spaces are maintained. T12-L1: Minimal broad-based disc bulge. Mild bilateral facet arthropathy. L1-L2: Mild broad-based disc bulge. Mild bilateral facet arthropathy. L2-L3: Mild broad-based disc bulge. Mild bilateral facet arthropathy. L3-L4: Broad-based disc bulge.  Mild bilateral facet arthropathy. L4-L5: Broad-based disc bulge. Severe bilateral facet arthropathy with ligamentum flavum infolding. Severe spinal stenosis. No foraminal stenosis. L5-S1: Mild broad-based disc bulge. Severe bilateral facet arthropathy. No foraminal stenosis. IMPRESSION: 1.  No acute osseous injury of the lumbar spine. 2. At L4-5 there is a broad-based disc bulge. Severe bilateral facet arthropathy with ligamentum flavum infolding. Severe spinal stenosis. Electronically Signed   By: Elige Ko   On: 08/01/2018 12:21    Medications:  I have reviewed the patient's current medications. Prior to Admission:  Medications Prior to  Admission  Medication Sig Dispense Refill Last Dose  . acetaminophen (TYLENOL) 325 MG tablet Take 2 tablets (650 mg total) by mouth every 4 (four) hours as needed for mild pain (or temp > 37.5 C (99.5 F)).   Past Month at PRN  . amLODipine (NORVASC) 5 MG tablet Take 5 mg by mouth daily.   05/18/2018 at 0800  . bimatoprost (LUMIGAN) 0.01 % SOLN Place 1 drop into both eyes 2 (two) times daily.   05/18/2018 at 0800  . dipyridamole-aspirin (AGGRENOX) 200-25 MG 12hr capsule Take 1 capsule by mouth 2 (two) times daily.   05/18/2018 at 0800  . Fe Fum-FePoly-FA-Vit C-Vit B3 (INTEGRA F) 125-1 MG CAPS Take 1 capsule by mouth daily.   05/18/2018 at 0800  . fluconazole (DIFLUCAN) 150 MG tablet Take 1 tablet (150 mg total) by mouth daily. 1 tablet 0   . gabapentin (NEURONTIN) 100 MG capsule Take 100 mg by mouth at bedtime.   05/17/2018 at 2100  . glipiZIDE (GLUCOTROL XL) 5 MG 24 hr tablet Take 1 tablet by mouth daily.   05/18/2018 at 0800  . Ipratropium-Albuterol (COMBIVENT RESPIMAT) 20-100 MCG/ACT AERS respimat Inhale 1 puff into the lungs every 6 (six) hours. 1 Inhaler 5 Unknown at Unknown  . latanoprost (XALATAN) 0.005 % ophthalmic solution Place 1 drop into both eyes at bedtime.   05/17/2018 at 2100  . lisinopril (PRINIVIL,ZESTRIL) 20 MG tablet Take 20 mg by mouth daily.   05/18/2018 at 0800  . metFORMIN (GLUCOPHAGE) 500 MG tablet Take 500 mg by mouth 2 (two) times daily.   05/18/2018 at 0800  . metoprolol succinate (TOPROL-XL) 50 MG 24 hr tablet Take 1 tablet by mouth daily.   05/18/2018 at 0800  . omeprazole (PRILOSEC) 20 MG capsule Take 20 mg by mouth daily.   05/18/2018 at 0800  . PARoxetine (PAXIL) 20 MG tablet Take 20 mg by mouth daily.   05/18/2018 at 0800  . simvastatin (ZOCOR) 20 MG tablet Take 20 mg by mouth daily.   05/18/2018 at 0800  . Vitamin D, Ergocalciferol, (DRISDOL) 50000 units CAPS capsule Take 50,000 Units by mouth every 7 (seven) days.   As directed at As directed   Scheduled: . dipyridamole-aspirin  1  capsule Oral BID  . enoxaparin (LOVENOX) injection  30 mg Subcutaneous Q24H  . gabapentin  100 mg Oral QHS  .  insulin aspart  0-5 Units Subcutaneous QHS  . insulin aspart  0-9 Units Subcutaneous TID WC  . latanoprost  1 drop Both Eyes QHS  . pantoprazole  40 mg Oral Daily  . PARoxetine  20 mg Oral Daily  . simvastatin  20 mg Oral Daily   Patient seen and examined.  Clinical course and management discussed.  Necessary edits performed.  I agree with the above.  Assessment and plan of care developed and discussed below.    Assessment: 84 y.o. female presenting  with complaints of generalized weakness, dizziness and alteration of awareness. Symptoms now improved. CT head unremarkable. Unable to perform MRI brain due to pacer. CTA showed left vertebral artery dissection, occluded at V3 segment. No emergent large vessel occlusion noted. Vascular has signed off. CT lumbar spine obtained due to prominent lower extremity symptoms which showed L4-5 disc bulge with severe spinal stenosis. Unlikely cause of her symptoms but will need follow up as outpatient.Hemoglobin A1c 6.0, LDL 61.  Was able to take a few steps with PT which is likely her baseline.  Plan: 1. Continue PT 2. Echocardiogram  pending 3. Prophylactic therapy-Antiplatelet med: continue home dose of Aggrenox 4. Recommend outpatient follow up with Neurology for further evaluation of spinal stenosis and possible acute infarct/TIA 5. Will need outpatient cardiology evaluation for possible prolonged cardiac monitoring for evaluation of occult afib.  This patient was staffed with Dr. Verlon Au, Thad Ranger who personally evaluated patient, reviewed documentation and agreed with assessment and plan of care as above.  Webb Silversmith, DNP, FNP-BC Board certified Nurse Practitioner Neurology Department   LOS: 0 days   08/02/2018  11:44 AM  Thana Farr, MD Neurology [phone]  08/02/2018  12:27 PM

## 2018-08-02 NOTE — Progress Notes (Signed)
[employer] - [place] [facility]                                                                                                                                                                                  Patient Demographics   Jessica Gardner, is a 84 y.o. female, DOB - [birth year], ZOX:[id number]  Admit date - 07/31/2018   Admitting Physician Oralia Manisavid Willis, MD  Outpatient Primary MD for the patient is Barbette ReichmannHande, Vishwanath, MD   LOS - 0  Subjective: Sitting in the chair currently has no complaint  Review of Systems:   CONSTITUTIONAL: No documented fever. No fatigue,  postive weakness. No weight gain, no weight loss.  EYES: No blurry or double vision.  ENT: No tinnitus. No postnasal drip. No redness of the oropharynx.  RESPIRATORY: No cough, no wheeze, no hemoptysis. No dyspnea.  CARDIOVASCULAR: No chest pain. No orthopnea. No palpitations. No syncope.  GASTROINTESTINAL: No nausea, no vomiting or diarrhea. No abdominal pain. No melena or hematochezia.  GENITOURINARY: No dysuria or hematuria.  ENDOCRINE: No polyuria or nocturia. No heat or cold intolerance.  HEMATOLOGY: No anemia. No bruising. No bleeding.  INTEGUMENTARY: No rashes. No lesions.  MUSCULOSKELETAL: No arthritis. No swelling. No gout.  NEUROLOGIC: No numbness, tingling, or ataxia. No seizure-type activity.  PSYCHIATRIC: No anxiety. No insomnia. No ADD.    Vitals:   Vitals:   08/01/18 1634 08/01/18 1932 08/02/18 0502 08/02/18 0852  BP: (!) 115/53 (!) 96/48 (!) 118/54 (!) 121/58  Pulse: 86 88 77 81  Resp: 20 18 20 18   Temp: 98.9 F (37.2 C) 99.1 F (37.3 C) 98.6 F (37 C) (!) 97.3 F (36.3 C)  TempSrc: Oral Oral Oral Oral  SpO2: 97% 95% 95% 99%  Weight:      Height:        Wt Readings from Last 3 Encounters:  07/31/18 58.3 kg  05/29/18 59.9 kg  05/18/18 61.2 kg     Intake/Output Summary (Last 24 hours) at 08/02/2018 1412 Last data filed at 08/02/2018 [id number] Gross per 24 hour   Intake -  Output 500 ml  Net -500 ml    Physical Exam:   GENERAL: Pleasant-appearing in no apparent distress.  HEAD, EYES, EARS, NOSE AND THROAT: Atraumatic, normocephalic. Extraocular muscles are intact. Pupils equal and reactive to light. Sclerae anicteric. No conjunctival injection. No oro-pharyngeal erythema.  NECK: Supple. There is no jugular venous distention. No bruits, no lymphadenopathy, no thyromegaly.  HEART: Regular rate and rhythm,. No murmurs, no rubs, no clicks.  LUNGS: Clear to auscultation bilaterally. No rales or rhonchi. No wheezes.  ABDOMEN: Soft, flat, nontender, nondistended. Has good bowel sounds. No hepatosplenomegaly appreciated.  EXTREMITIES: No evidence of any cyanosis, clubbing, or peripheral edema.  +2 pedal and radial pulses bilaterally.  NEUROLOGIC: The patient is alert, awake, and oriented x3 with no focal motor or sensory deficits appreciated bilaterally.  SKIN: Moist and warm with no rashes appreciated.  Psych: Not anxious, depressed LN: No inguinal LN enlargement    Antibiotics   Anti-infectives (From admission, onward)   None      Medications   Scheduled Meds: . dipyridamole-aspirin  1 capsule Oral BID  . enoxaparin (LOVENOX) injection  30 mg Subcutaneous Q24H  . gabapentin  100 mg Oral QHS  . insulin aspart  0-5 Units Subcutaneous QHS  . insulin aspart  0-9 Units Subcutaneous TID WC  . latanoprost  1 drop Both Eyes QHS  . pantoprazole  40 mg Oral Daily  . PARoxetine  20 mg Oral Daily  . simvastatin  20 mg Oral Daily   Continuous Infusions: PRN Meds:.acetaminophen **OR** acetaminophen, ondansetron **OR** ondansetron (ZOFRAN) IV   Data Review:   Micro Results No results found for this or any previous visit (from the past 240 hour(s)).  Radiology Reports Ct Angio Head W Or Wo Contrast  Result Date: 07/31/2018 CLINICAL DATA:  Generalized weakness. History of stroke, dementia, pacemaker. EXAM: CT ANGIOGRAPHY HEAD AND NECK  TECHNIQUE: Multidetector CT imaging of the head and neck was performed using the standard protocol during bolus administration of intravenous contrast. Multiplanar CT image reconstructions and MIPs were obtained to evaluate the vascular anatomy. Carotid stenosis measurements (when applicable) are obtained utilizing NASCET criteria, using the distal internal carotid diameter as the denominator. CONTRAST:  75mL OMNIPAQUE IOHEXOL 350 MG/ML SOLN COMPARISON:  CT HEAD July 31, 2018 and carotid ultrasound September 09, 2017. FINDINGS: CTA NECK FINDINGS: AORTIC ARCH: Normal appearance of the thoracic arch, normal branch pattern. Mild calcific atherosclerosis aortic arch. The origins of the innominate, left Common carotid artery and subclavian artery are widely patent. RIGHT CAROTID SYSTEM: Common carotid artery is patent. Mild calcific atherosclerosis of the carotid bifurcation without hemodynamically significant stenosis by NASCET criteria. Patent internal carotid artery. LEFT CAROTID SYSTEM: Common carotid artery is patent. Mild calcific atherosclerosis of the carotid bifurcation without hemodynamically significant stenosis by NASCET criteria. Patent internal carotid artery. VERTEBRAL ARTERIES:RIGHT vertebral artery is dominant. Severe luminal irregularity LEFT vertebral artery with loss of contrast opacification LEFT V3 segment. No discrete dissection flap or pseudoaneurysm. SKELETON: No acute osseous process though bone windows have not been submitted. OTHER NECK: Soft tissues of the neck are nonacute though, not tailored for evaluation. UPPER CHEST: Included lung apices are clear. No superior mediastinal lymphadenopathy. LEFT cardiac pacemaker via subclavian venous approach. CTA HEAD FINDINGS: ANTERIOR CIRCULATION: Patent cervical internal carotid arteries, petrous, cavernous and supra clinoid internal carotid arteries. Severe calcific atherosclerosis carotid siphons. Patent anterior communicating artery. Patent  anterior and middle cerebral arteries. Moderate stenosis RIGHT A2 segment. Moderate bilateral M1 segment stenosis. Moderate stenosis LEFT M2 segment. Moderate stenosis RIGHT M3 segment. No large vessel occlusion, significant stenosis, contrast extravasation or aneurysm. POSTERIOR CIRCULATION: Complete loss of LEFT vertebral artery contrast opacification. Patent RIGHT vertebral arteries mild calcific atherosclerosis. Moderate basilar artery tandem stenosis. Patent posterior cerebral arteries with moderate tandem stenosis. No large vessel occlusion, significant stenosis, contrast extravasation or aneurysm. VENOUS SINUSES: Major dural venous sinuses are patent though not tailored for evaluation on this angiographic examination. ANATOMIC VARIANTS: None. DELAYED PHASE: No abnormal intracranial enhancement. MIP images reviewed. IMPRESSION: CTA NECK: 1.  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 2. No  hemodynamically significant stenosis internal carotid arteries. CTA HEAD: 1. Occluded LEFT vertebral artery without reconstitution. 2. No emergent large vessel occlusion. Multifocal moderate cerebral artery stenosis compatible with atherosclerosis. Acute findings discussed with and reconfirmed by Dr.PATRICK ROBINSON on 07/31/2018 at 9:26 pm. Aortic Atherosclerosis (ICD10-I70.0). Electronically Signed   By: Awilda Metro M.D.   On: 07/31/2018 21:27   Ct Head Wo Contrast  Result Date: 07/31/2018 CLINICAL DATA:  84 year old female with generalized weakness and dizziness today. History of prior stroke. EXAM: CT HEAD WITHOUT CONTRAST TECHNIQUE: Contiguous axial images were obtained from the base of the skull through the vertex without intravenous contrast. COMPARISON:  02/21/2018 head CT. FINDINGS: Brain: No intracranial hemorrhage or CT evidence of large acute infarct. Prominent chronic microvascular changes. Global atrophy. Ventricular prominence most likely related to atrophy rather than hydrocephalus  and without change. Aqueduct is patent. Prominent dural calcification lateral to the temporal lobes bilaterally. Meningiomas felt less likely consideration. Findings stable. Vascular: Vascular calcifications.  No acute hyperdense vessel. Skull: No acute abnormality. Sinuses/Orbits: No acute orbital abnormality. Visualized paranasal sinuses clear. Other: Mastoid air cells and middle ear cavities are clear. IMPRESSION: 1. No acute intracranial abnormality noted. 2. Prominent chronic microvascular changes. 3. Global atrophy. Electronically Signed   By: Lacy Duverney M.D.   On: 07/31/2018 18:45   Ct Angio Neck W Or Wo Contrast  Result Date: 07/31/2018 CLINICAL DATA:  Generalized weakness. History of stroke, dementia, pacemaker. EXAM: CT ANGIOGRAPHY HEAD AND NECK TECHNIQUE: Multidetector CT imaging of the head and neck was performed using the standard protocol during bolus administration of intravenous contrast. Multiplanar CT image reconstructions and MIPs were obtained to evaluate the vascular anatomy. Carotid stenosis measurements (when applicable) are obtained utilizing NASCET criteria, using the distal internal carotid diameter as the denominator. CONTRAST:  75mL OMNIPAQUE IOHEXOL 350 MG/ML SOLN COMPARISON:  CT HEAD July 31, 2018 and carotid ultrasound September 09, 2017. FINDINGS: CTA NECK FINDINGS: AORTIC ARCH: Normal appearance of the thoracic arch, normal branch pattern. Mild calcific atherosclerosis aortic arch. The origins of the innominate, left Common carotid artery and subclavian artery are widely patent. RIGHT CAROTID SYSTEM: Common carotid artery is patent. Mild calcific atherosclerosis of the carotid bifurcation without hemodynamically significant stenosis by NASCET criteria. Patent internal carotid artery. LEFT CAROTID SYSTEM: Common carotid artery is patent. Mild calcific atherosclerosis of the carotid bifurcation without hemodynamically significant stenosis by NASCET criteria. Patent internal  carotid artery. VERTEBRAL ARTERIES:RIGHT vertebral artery is dominant. Severe luminal irregularity LEFT vertebral artery with loss of contrast opacification LEFT V3 segment. No discrete dissection flap or pseudoaneurysm. SKELETON: No acute osseous process though bone windows have not been submitted. OTHER NECK: Soft tissues of the neck are nonacute though, not tailored for evaluation. UPPER CHEST: Included lung apices are clear. No superior mediastinal lymphadenopathy. LEFT cardiac pacemaker via subclavian venous approach. CTA HEAD FINDINGS: ANTERIOR CIRCULATION: Patent cervical internal carotid arteries, petrous, cavernous and supra clinoid internal carotid arteries. Severe calcific atherosclerosis carotid siphons. Patent anterior communicating artery. Patent anterior and middle cerebral arteries. Moderate stenosis RIGHT A2 segment. Moderate bilateral M1 segment stenosis. Moderate stenosis LEFT M2 segment. Moderate stenosis RIGHT M3 segment. No large vessel occlusion, significant stenosis, contrast extravasation or aneurysm. POSTERIOR CIRCULATION: Complete loss of LEFT vertebral artery contrast opacification. Patent RIGHT vertebral arteries mild calcific atherosclerosis. Moderate basilar artery tandem stenosis. Patent posterior cerebral arteries with moderate tandem stenosis. No large vessel occlusion, significant stenosis, contrast extravasation or aneurysm. VENOUS SINUSES: Major dural venous sinuses are patent though not tailored for  evaluation on this angiographic examination. ANATOMIC VARIANTS: None. DELAYED PHASE: No abnormal intracranial enhancement. MIP images reviewed. IMPRESSION: CTA NECK: 1.  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 2. No hemodynamically significant stenosis internal carotid arteries. CTA HEAD: 1. Occluded LEFT vertebral artery without reconstitution. 2. No emergent large vessel occlusion. Multifocal moderate cerebral artery stenosis compatible with atherosclerosis. Acute  findings discussed with and reconfirmed by Dr.PATRICK ROBINSON on 07/31/2018 at 9:26 pm. Aortic Atherosclerosis (ICD10-I70.0). Electronically Signed   By: Awilda Metro M.D.   On: 07/31/2018 21:27   Ct Lumbar Spine Wo Contrast  Result Date: 08/01/2018 CLINICAL DATA:  Increased weakness for 2 days. Weakness in bilateral lower extremities EXAM: CT LUMBAR SPINE WITHOUT CONTRAST TECHNIQUE: Multidetector CT imaging of the lumbar spine was performed without intravenous contrast administration. Multiplanar CT image reconstructions were also generated. COMPARISON:  None. FINDINGS: Segmentation: 5 lumbar type vertebrae. Alignment: 2 mm anterolisthesis of L4 on L5. Vertebrae: No acute fracture or focal pathologic process. Generalized osteopenia. Paraspinal and other soft tissues: No acute paraspinal abnormality. Abdominal aortic atherosclerosis. Contrast material in the renal collecting system from recent CT a of head. Disc levels: Disc spaces are maintained. T12-L1: Minimal broad-based disc bulge. Mild bilateral facet arthropathy. L1-L2: Mild broad-based disc bulge. Mild bilateral facet arthropathy. L2-L3: Mild broad-based disc bulge. Mild bilateral facet arthropathy. L3-L4: Broad-based disc bulge.  Mild bilateral facet arthropathy. L4-L5: Broad-based disc bulge. Severe bilateral facet arthropathy with ligamentum flavum infolding. Severe spinal stenosis. No foraminal stenosis. L5-S1: Mild broad-based disc bulge. Severe bilateral facet arthropathy. No foraminal stenosis. IMPRESSION: 1.  No acute osseous injury of the lumbar spine. 2. At L4-5 there is a broad-based disc bulge. Severe bilateral facet arthropathy with ligamentum flavum infolding. Severe spinal stenosis. Electronically Signed   By: Elige Ko   On: 08/01/2018 12:21     CBC Recent Labs  Lab 07/31/18 1456 08/01/18 0609  WBC 6.5 5.5  HGB 12.5 10.1*  HCT 38.0 30.6*  PLT 324 308  MCV 95.0 94.7  MCH 31.3 31.3  MCHC 32.9 33.0  RDW 11.5 11.7     Chemistries  Recent Labs  Lab 07/31/18 1456 08/01/18 0609  NA 143 142  K 4.4 3.6  CL 102 106  CO2 27 28  GLUCOSE 134* 155*  BUN 14 18  CREATININE 1.20* 1.10*  CALCIUM 9.8 9.1  AST 30  --   ALT 18  --   ALKPHOS 65  --   BILITOT 0.9  --    ------------------------------------------------------------------------------------------------------------------ estimated creatinine clearance is 29.3 mL/min (A) (by C-G formula based on SCr of 1.1 mg/dL (H)). ------------------------------------------------------------------------------------------------------------------ Recent Labs    08/01/18 0609  HGBA1C 6.0*   ------------------------------------------------------------------------------------------------------------------ Recent Labs    08/01/18 0609  CHOL 113  HDL 38*  LDLCALC 61  TRIG 70  CHOLHDL 3.0   ------------------------------------------------------------------------------------------------------------------ Recent Labs    08/01/18 0609  TSH 2.899   ------------------------------------------------------------------------------------------------------------------ No results for input(s): VITAMINB12, FOLATE, FERRITIN, TIBC, IRON, RETICCTPCT in the last 72 hours.  Coagulation profile No results for input(s): INR, PROTIME in the last 168 hours.  No results for input(s): DDIMER in the last 72 hours.  Cardiac Enzymes Recent Labs  Lab 07/31/18 1456  TROPONINI <0.03   ------------------------------------------------------------------------------------------------------------------ Invalid input(s): POCBNP    Assessment & Plan  Patient's 84 year old with generalized weakness    Vertebral artery dissection (HCC) -unclear if this is related to her symptoms or not Appreciate vascular input continue Aggrenox    Diabetes (HCC) -carb modified diet with  sliding scale insulin coverage    HTN (hypertension) -home dose antihypertensives    CAD (coronary  artery disease) -continue home meds    Hyperlipidemia -Home dose antilipid  Generalized weakness PT evaluation recommends rehab awaiting insurance approval     Code Status Orders  (From admission, onward)         Start     Ordered   07/31/18 2352  Full code  Continuous     07/31/18 2351        Code Status History    Date Active Date Inactive Code Status Order ID Comments User Context   01/02/2018 0126 01/03/2018 1721 Full Code [id number]  Rometta Emery, MD ED   09/09/2017 0146 09/13/2017 1942 Full Code [id number]  Oralia Manis, MD Inpatient   09/17/2016 1557 09/18/2016 1728 Full Code [id number]  Katha Hamming, MD ED    Advance Directive Documentation     Most Recent Value  Type of Advance Directive  Healthcare Power of Attorney  Pre-existing out of facility DNR order (yellow form or pink MOST form)  -  "MOST" Form in Place?  -           Consults  Neuro, vascular    DVT Prophylaxis  Lovenox   Lab Results  Component Value Date   PLT 308 08/01/2018     Time Spent in minutes  Greater than 50% of time spent in care coordination and counseling patient regarding the condition and plan of care.   Auburn Bilberry M.D on 08/02/2018 at 2:12 PM  Between 7am to 6pm - Pager - [phone]  After 6pm go to [email or web address] - Social research officer, government  [employer]   Office  [phone]

## 2018-08-02 NOTE — Progress Notes (Signed)
*  PRELIMINARY RESULTS* Echocardiogram 2D Echocardiogram has been performed.  Cristela BlueHege, Zayquan Bogard 08/02/2018, 8:19 AM

## 2018-08-02 NOTE — Clinical Social Work Note (Addendum)
CSW attempted to contact patient's daughter to discuss bed offers awaiting for call back from her.  3:00pm  CSW spoke to patient's caregiver Arlyn DunningLinda Gattis, [phone] who was at bedside and presented bed offers, she spoke to patient's daughter and [facility]  [facility] to [facility], left a message on voice mail to start insurance authorization.  CSW continuing to follow patient's progress throughout discharge planning.  Ervin KnackEric R. Bilbo Carcamo, MSW, Theresia MajorsLCSWA [phone]  08/02/2018 2:13 PM

## 2018-08-03 LAB — URINALYSIS, ROUTINE W REFLEX MICROSCOPIC
BACTERIA UA: NONE SEEN
Bilirubin Urine: NEGATIVE
Glucose, UA: 50 mg/dL — AB
KETONES UR: NEGATIVE mg/dL
Leukocytes, UA: NEGATIVE
Nitrite: NEGATIVE
PH: 5 (ref 5.0–8.0)
Protein, ur: NEGATIVE mg/dL
SPECIFIC GRAVITY, URINE: 1.016 (ref 1.005–1.030)
Squamous Epithelial / LPF: NONE SEEN (ref 0–5)

## 2018-08-03 LAB — GLUCOSE, CAPILLARY
GLUCOSE-CAPILLARY: 107 mg/dL — AB (ref 70–99)
Glucose-Capillary: 159 mg/dL — ABNORMAL HIGH (ref 70–99)
Glucose-Capillary: 144 mg/dL — ABNORMAL HIGH (ref 70–99)
Glucose-Capillary: 141 mg/dL — ABNORMAL HIGH (ref 70–99)

## 2018-08-03 MED ORDER — SENNA 8.6 MG PO TABS
1.0000 | ORAL_TABLET | Freq: Every day | ORAL | Status: DC
  Administered 2018-08-04: 8.6 mg via ORAL
  Filled 2018-08-03: qty 1

## 2018-08-03 MED ORDER — TAMSULOSIN HCL 0.4 MG PO CAPS
0.4000 mg | ORAL_CAPSULE | Freq: Every day | ORAL | Status: DC
  Administered 2018-08-04: 0.4 mg via ORAL
  Filled 2018-08-03: qty 1

## 2018-08-03 MED ORDER — TAMSULOSIN HCL 0.4 MG PO CAPS: 0.4000 mg | ORAL_CAPSULE | Freq: Every day | ORAL | Status: AC

## 2018-08-03 MED ORDER — DOCUSATE SODIUM 100 MG PO CAPS
200.0000 mg | ORAL_CAPSULE | Freq: Two times a day (BID) | ORAL | Status: DC
  Administered 2018-08-03 – 2018-08-04 (×2): 200 mg via ORAL
  Filled 2018-08-03 (×2): qty 2

## 2018-08-03 NOTE — Progress Notes (Signed)
[employer] - [place] at [facility]                                                                                                                                                                                  Patient Demographics   Jessica Gardner, is a 84 y.o. female, DOB - 01/21/1930, ZOX:[id number]  Admit date - 07/31/2018   Admitting Physician Oralia Manis, MD  Outpatient Primary MD for the patient is Barbette Reichmann, MD   LOS - 0  Subjective: Patient continues to have urinary retention requiring in and out cath denies any complaint  Review of Systems:   CONSTITUTIONAL: No documented fever. No fatigue,  postive weakness. No weight gain, no weight loss.  EYES: No blurry or double vision.  ENT: No tinnitus. No postnasal drip. No redness of the oropharynx.  RESPIRATORY: No cough, no wheeze, no hemoptysis. No dyspnea.  CARDIOVASCULAR: No chest pain. No orthopnea. No palpitations. No syncope.  GASTROINTESTINAL: No nausea, no vomiting or diarrhea. No abdominal pain. No melena or hematochezia.  GENITOURINARY: No dysuria or hematuria.  ENDOCRINE: No polyuria or nocturia. No heat or cold intolerance.  HEMATOLOGY: No anemia. No bruising. No bleeding.  INTEGUMENTARY: No rashes. No lesions.  MUSCULOSKELETAL: No arthritis. No swelling. No gout.  NEUROLOGIC: No numbness, tingling, or ataxia. No seizure-type activity.  PSYCHIATRIC: No anxiety. No insomnia. No ADD.    Vitals:   Vitals:   08/02/18 0502 08/02/18 0852 08/02/18 1949 08/03/18 0428  BP: (!) 118/54 (!) 121/58 115/65 (!) 126/58  Pulse: 77 81 88 80  Resp: 20 18 (!) 21 18  Temp: 98.6 F (37 C) (!) 97.3 F (36.3 C) 98.6 F (37 C) 98.3 F (36.8 C)  TempSrc: Oral Oral Oral Oral  SpO2: 95% 99% 99% 97%  Weight:      Height:        Wt Readings from Last 3 Encounters:  07/31/18 58.3 kg  05/29/18 59.9 kg  05/18/18 61.2 kg     Intake/Output Summary (Last 24 hours) at 08/03/2018 1310 Last data filed at  08/03/2018 0300 Gross per 24 hour  Intake 360 ml  Output 1000 ml  Net -640 ml    Physical Exam:   GENERAL: Pleasant-appearing in no apparent distress.  HEAD, EYES, EARS, NOSE AND THROAT: Atraumatic, normocephalic. Extraocular muscles are intact. Pupils equal and reactive to light. Sclerae anicteric. No conjunctival injection. No oro-pharyngeal erythema.  NECK: Supple. There is no jugular venous distention. No bruits, no lymphadenopathy, no thyromegaly.  HEART: Regular rate and rhythm,. No murmurs, no rubs, no clicks.  LUNGS: Clear to auscultation bilaterally. No rales or rhonchi. No wheezes.  ABDOMEN: Soft, flat, nontender, nondistended.  Has good bowel sounds. No hepatosplenomegaly appreciated.  EXTREMITIES: No evidence of any cyanosis, clubbing, or peripheral edema.  +2 pedal and radial pulses bilaterally.  NEUROLOGIC: The patient is alert, awake, and oriented x3 with no focal motor or sensory deficits appreciated bilaterally.  SKIN: Moist and warm with no rashes appreciated.  Psych: Not anxious, depressed LN: No inguinal LN enlargement    Antibiotics   Anti-infectives (From admission, onward)   None      Medications   Scheduled Meds: . dipyridamole-aspirin  1 capsule Oral BID  . enoxaparin (LOVENOX) injection  30 mg Subcutaneous Q24H  . gabapentin  100 mg Oral QHS  . insulin aspart  0-5 Units Subcutaneous QHS  . insulin aspart  0-9 Units Subcutaneous TID WC  . latanoprost  1 drop Both Eyes QHS  . pantoprazole  40 mg Oral Daily  . PARoxetine  20 mg Oral Daily  . simvastatin  20 mg Oral Daily   Continuous Infusions: PRN Meds:.acetaminophen **OR** acetaminophen, ondansetron **OR** ondansetron (ZOFRAN) IV   Data Review:   Micro Results No results found for this or any previous visit (from the past 240 hour(s)).  Radiology Reports Ct Angio Head W Or Wo Contrast  Result Date: 07/31/2018 CLINICAL DATA:  Generalized weakness. History of stroke, dementia, pacemaker.  EXAM: CT ANGIOGRAPHY HEAD AND NECK TECHNIQUE: Multidetector CT imaging of the head and neck was performed using the standard protocol during bolus administration of intravenous contrast. Multiplanar CT image reconstructions and MIPs were obtained to evaluate the vascular anatomy. Carotid stenosis measurements (when applicable) are obtained utilizing NASCET criteria, using the distal internal carotid diameter as the denominator. CONTRAST:  75mL OMNIPAQUE IOHEXOL 350 MG/ML SOLN COMPARISON:  CT HEAD July 31, 2018 and carotid ultrasound September 09, 2017. FINDINGS: CTA NECK FINDINGS: AORTIC ARCH: Normal appearance of the thoracic arch, normal branch pattern. Mild calcific atherosclerosis aortic arch. The origins of the innominate, left Common carotid artery and subclavian artery are widely patent. RIGHT CAROTID SYSTEM: Common carotid artery is patent. Mild calcific atherosclerosis of the carotid bifurcation without hemodynamically significant stenosis by NASCET criteria. Patent internal carotid artery. LEFT CAROTID SYSTEM: Common carotid artery is patent. Mild calcific atherosclerosis of the carotid bifurcation without hemodynamically significant stenosis by NASCET criteria. Patent internal carotid artery. VERTEBRAL ARTERIES:RIGHT vertebral artery is dominant. Severe luminal irregularity LEFT vertebral artery with loss of contrast opacification LEFT V3 segment. No discrete dissection flap or pseudoaneurysm. SKELETON: No acute osseous process though bone windows have not been submitted. OTHER NECK: Soft tissues of the neck are nonacute though, not tailored for evaluation. UPPER CHEST: Included lung apices are clear. No superior mediastinal lymphadenopathy. LEFT cardiac pacemaker via subclavian venous approach. CTA HEAD FINDINGS: ANTERIOR CIRCULATION: Patent cervical internal carotid arteries, petrous, cavernous and supra clinoid internal carotid arteries. Severe calcific atherosclerosis carotid siphons. Patent  anterior communicating artery. Patent anterior and middle cerebral arteries. Moderate stenosis RIGHT A2 segment. Moderate bilateral M1 segment stenosis. Moderate stenosis LEFT M2 segment. Moderate stenosis RIGHT M3 segment. No large vessel occlusion, significant stenosis, contrast extravasation or aneurysm. POSTERIOR CIRCULATION: Complete loss of LEFT vertebral artery contrast opacification. Patent RIGHT vertebral arteries mild calcific atherosclerosis. Moderate basilar artery tandem stenosis. Patent posterior cerebral arteries with moderate tandem stenosis. No large vessel occlusion, significant stenosis, contrast extravasation or aneurysm. VENOUS SINUSES: Major dural venous sinuses are patent though not tailored for evaluation on this angiographic examination. ANATOMIC VARIANTS: None. DELAYED PHASE: No abnormal intracranial enhancement. MIP images reviewed. IMPRESSION: CTA NECK: 1.  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 2. No hemodynamically significant stenosis internal carotid arteries. CTA HEAD: 1. Occluded LEFT vertebral artery without reconstitution. 2. No emergent large vessel occlusion. Multifocal moderate cerebral artery stenosis compatible with atherosclerosis. Acute findings discussed with and reconfirmed by Dr.PATRICK ROBINSON on 07/31/2018 at 9:26 pm. Aortic Atherosclerosis (ICD10-I70.0). Electronically Signed   By: Awilda Metroourtnay  Bloomer M.D.   On: 07/31/2018 21:27   Ct Head Wo Contrast  Result Date: 07/31/2018 CLINICAL DATA:  [age] year old female with generalized weakness and dizziness today. History of prior stroke. EXAM: CT HEAD WITHOUT CONTRAST TECHNIQUE: Contiguous axial images were obtained from the base of the skull through the vertex without intravenous contrast. COMPARISON:  02/21/2018 head CT. FINDINGS: Brain: No intracranial hemorrhage or CT evidence of large acute infarct. Prominent chronic microvascular changes. Global atrophy. Ventricular prominence most likely related  to atrophy rather than hydrocephalus and without change. Aqueduct is patent. Prominent dural calcification lateral to the temporal lobes bilaterally. Meningiomas felt less likely consideration. Findings stable. Vascular: Vascular calcifications.  No acute hyperdense vessel. Skull: No acute abnormality. Sinuses/Orbits: No acute orbital abnormality. Visualized paranasal sinuses clear. Other: Mastoid air cells and middle ear cavities are clear. IMPRESSION: 1. No acute intracranial abnormality noted. 2. Prominent chronic microvascular changes. 3. Global atrophy. Electronically Signed   By: Lacy DuverneySteven  Olson M.D.   On: 07/31/2018 18:45   Ct Angio Neck W Or Wo Contrast  Result Date: 07/31/2018 CLINICAL DATA:  Generalized weakness. History of stroke, dementia, pacemaker. EXAM: CT ANGIOGRAPHY HEAD AND NECK TECHNIQUE: Multidetector CT imaging of the head and neck was performed using the standard protocol during bolus administration of intravenous contrast. Multiplanar CT image reconstructions and MIPs were obtained to evaluate the vascular anatomy. Carotid stenosis measurements (when applicable) are obtained utilizing NASCET criteria, using the distal internal carotid diameter as the denominator. CONTRAST:  75mL OMNIPAQUE IOHEXOL 350 MG/ML SOLN COMPARISON:  CT HEAD July 31, 2018 and carotid ultrasound September 09, 2017. FINDINGS: CTA NECK FINDINGS: AORTIC ARCH: Normal appearance of the thoracic arch, normal branch pattern. Mild calcific atherosclerosis aortic arch. The origins of the innominate, left Common carotid artery and subclavian artery are widely patent. RIGHT CAROTID SYSTEM: Common carotid artery is patent. Mild calcific atherosclerosis of the carotid bifurcation without hemodynamically significant stenosis by NASCET criteria. Patent internal carotid artery. LEFT CAROTID SYSTEM: Common carotid artery is patent. Mild calcific atherosclerosis of the carotid bifurcation without hemodynamically significant stenosis  by NASCET criteria. Patent internal carotid artery. VERTEBRAL ARTERIES:RIGHT vertebral artery is dominant. Severe luminal irregularity LEFT vertebral artery with loss of contrast opacification LEFT V3 segment. No discrete dissection flap or pseudoaneurysm. SKELETON: No acute osseous process though bone windows have not been submitted. OTHER NECK: Soft tissues of the neck are nonacute though, not tailored for evaluation. UPPER CHEST: Included lung apices are clear. No superior mediastinal lymphadenopathy. LEFT cardiac pacemaker via subclavian venous approach. CTA HEAD FINDINGS: ANTERIOR CIRCULATION: Patent cervical internal carotid arteries, petrous, cavernous and supra clinoid internal carotid arteries. Severe calcific atherosclerosis carotid siphons. Patent anterior communicating artery. Patent anterior and middle cerebral arteries. Moderate stenosis RIGHT A2 segment. Moderate bilateral M1 segment stenosis. Moderate stenosis LEFT M2 segment. Moderate stenosis RIGHT M3 segment. No large vessel occlusion, significant stenosis, contrast extravasation or aneurysm. POSTERIOR CIRCULATION: Complete loss of LEFT vertebral artery contrast opacification. Patent RIGHT vertebral arteries mild calcific atherosclerosis. Moderate basilar artery tandem stenosis. Patent posterior cerebral arteries with moderate tandem stenosis. No large vessel occlusion, significant stenosis, contrast extravasation or aneurysm. VENOUS SINUSES: Major dural  venous sinuses are patent though not tailored for evaluation on this angiographic examination. ANATOMIC VARIANTS: None. DELAYED PHASE: No abnormal intracranial enhancement. MIP images reviewed. IMPRESSION: CTA NECK: 1.  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 2. No hemodynamically significant stenosis internal carotid arteries. CTA HEAD: 1. Occluded LEFT vertebral artery without reconstitution. 2. No emergent large vessel occlusion. Multifocal moderate cerebral artery stenosis  compatible with atherosclerosis. Acute findings discussed with and reconfirmed by Dr.PATRICK ROBINSON on 07/31/2018 at 9:26 pm. Aortic Atherosclerosis (ICD10-I70.0). Electronically Signed   By: Awilda Metro M.D.   On: 07/31/2018 21:27   Ct Lumbar Spine Wo Contrast  Result Date: 08/01/2018 CLINICAL DATA:  Increased weakness for 2 days. Weakness in bilateral lower extremities EXAM: CT LUMBAR SPINE WITHOUT CONTRAST TECHNIQUE: Multidetector CT imaging of the lumbar spine was performed without intravenous contrast administration. Multiplanar CT image reconstructions were also generated. COMPARISON:  None. FINDINGS: Segmentation: 5 lumbar type vertebrae. Alignment: 2 mm anterolisthesis of L4 on L5. Vertebrae: No acute fracture or focal pathologic process. Generalized osteopenia. Paraspinal and other soft tissues: No acute paraspinal abnormality. Abdominal aortic atherosclerosis. Contrast material in the renal collecting system from recent CT a of head. Disc levels: Disc spaces are maintained. T12-L1: Minimal broad-based disc bulge. Mild bilateral facet arthropathy. L1-L2: Mild broad-based disc bulge. Mild bilateral facet arthropathy. L2-L3: Mild broad-based disc bulge. Mild bilateral facet arthropathy. L3-L4: Broad-based disc bulge.  Mild bilateral facet arthropathy. L4-L5: Broad-based disc bulge. Severe bilateral facet arthropathy with ligamentum flavum infolding. Severe spinal stenosis. No foraminal stenosis. L5-S1: Mild broad-based disc bulge. Severe bilateral facet arthropathy. No foraminal stenosis. IMPRESSION: 1.  No acute osseous injury of the lumbar spine. 2. At L4-5 there is a broad-based disc bulge. Severe bilateral facet arthropathy with ligamentum flavum infolding. Severe spinal stenosis. Electronically Signed   By: Elige Ko   On: 08/01/2018 12:21     CBC Recent Labs  Lab 07/31/18 1456 08/01/18 0609  WBC 6.5 5.5  HGB 12.5 10.1*  HCT 38.0 30.6*  PLT 324 308  MCV 95.0 94.7  MCH 31.3  31.3  MCHC 32.9 33.0  RDW 11.5 11.7    Chemistries  Recent Labs  Lab 07/31/18 1456 08/01/18 0609  NA 143 142  K 4.4 3.6  CL 102 106  CO2 27 28  GLUCOSE 134* 155*  BUN 14 18  CREATININE 1.20* 1.10*  CALCIUM 9.8 9.1  AST 30  --   ALT 18  --   ALKPHOS 65  --   BILITOT 0.9  --    ------------------------------------------------------------------------------------------------------------------ estimated creatinine clearance is 29.3 mL/min (A) (by C-G formula based on SCr of 1.1 mg/dL (H)). ------------------------------------------------------------------------------------------------------------------ Recent Labs    08/01/18 0609  HGBA1C 6.0*   ------------------------------------------------------------------------------------------------------------------ Recent Labs    08/01/18 0609  CHOL 113  HDL 38*  LDLCALC 61  TRIG 70  CHOLHDL 3.0   ------------------------------------------------------------------------------------------------------------------ Recent Labs    08/01/18 0609  TSH 2.899   ------------------------------------------------------------------------------------------------------------------ No results for input(s): VITAMINB12, FOLATE, FERRITIN, TIBC, IRON, RETICCTPCT in the last 72 hours.  Coagulation profile No results for input(s): INR, PROTIME in the last 168 hours.  No results for input(s): DDIMER in the last 72 hours.  Cardiac Enzymes Recent Labs  Lab 07/31/18 1456  TROPONINI <0.03   ------------------------------------------------------------------------------------------------------------------ Invalid input(s): POCBNP    Assessment & Plan  Patient's 84 year old with generalized weakness  Urinary retention Place a Foley catheter start on Flomax will need voiding trials at the nursing home    Vertebral artery  dissection (HCC) -unclear if this is related to her symptoms or not Appreciate vascular input continue Aggrenox     Diabetes (HCC) -carb modified diet with sliding scale insulin coverage    HTN (hypertension) -home dose antihypertensives    CAD (coronary artery disease) -continue home meds    Hyperlipidemia -Home dose antilipid  Generalized weakness PT evaluation recommends rehab awaiting insurance approval     Code Status Orders  (From admission, onward)         Start     Ordered   07/31/18 2352  Full code  Continuous     07/31/18 2351        Code Status History    Date Active Date Inactive Code Status Order ID Comments User Context   01/02/2018 0126 01/03/2018 1721 Full Code [id number]  Rometta Emery, MD ED   09/09/2017 0146 09/13/2017 1942 Full Code [id number]  Oralia Manis, MD Inpatient   09/17/2016 1557 09/18/2016 1728 Full Code [id number]  Katha Hamming, MD ED    Advance Directive Documentation     Most Recent Value  Type of Advance Directive  Healthcare Power of Attorney  Pre-existing out of facility DNR order (yellow form or pink MOST form)  -  "MOST" Form in Place?  -           Consults  Neuro, vascular    DVT Prophylaxis  Lovenox   Lab Results  Component Value Date   PLT 308 08/01/2018     Time Spent in minutes  Greater than 50% of time spent in care coordination and counseling patient regarding the condition and plan of care.   Auburn Bilberry M.D on 08/03/2018 at 1:10 PM  Between 7am to 6pm - Pager - [phone]  After 6pm go to [email or web address] - Social research officer, government  [employer]   Office  [phone]

## 2018-08-03 NOTE — Progress Notes (Signed)
Physical Therapy Treatment Patient Details Name: Jessica Gardner MRN: [id number] DOB: 04-Jun-1930 Today's Date: 08/03/2018    History of Present Illness 84 year old female with a past medical history of diabetes, dementia, hypertension, hyperlipidemia, coronary artery disease status post MI / pace maker placement, anemia, renal insufficiency, history of CVA with residual left-sided weakness.  She presented to the ED with bilateral lower extremity weakness, changes in speech, increased confusion.    PT Comments    Pt in bed, ready for session.  To edge of bed with encouragement and increased time but she was able to get to edge without assist and use of rail.  Sitting with supervision but no LOB.  Stood with min guard/assist to walker and she was able to transfer to recliner with min a x 1.  Participated in exercises as described below.  She was able to stand and walk and additional 12' today with walker and min assist.  She was limited by fatigue.  Short step length with decreased step height, slow but no LOB.  She required verbal and tactile cues to keep walker closer to her.     Follow Up Recommendations  SNF     Equipment Recommendations       Recommendations for Other Services       Precautions / Restrictions Precautions Precautions: Fall Restrictions Weight Bearing Restrictions: No    Mobility  Bed Mobility Overal bed mobility: Needs Assistance Bed Mobility: Supine to Sit     Supine to sit: Min guard     General bed mobility comments: encouragement given. She voiced being fearful of falling.  Transfers Overall transfer level: Needs assistance Equipment used: Rolling walker (2 wheeled) Transfers: Sit to/from Stand Sit to Stand: Min guard;Min assist            Ambulation/Gait Ambulation/Gait assistance: Min Chemical engineer (Feet): 12 Feet Assistive device: Rolling walker (2 wheeled) Gait Pattern/deviations: Step-to pattern;Decreased step length -  right;Decreased step length - left Gait velocity: decreased   General Gait Details: Overall improvement today with gait but continues with slow speed, short step length, with verbal and physical cues to keep walker close to her.  Limited by fatigue.   Stairs             Wheelchair Mobility    Modified Rankin (Stroke Patients Only)       Balance Overall balance assessment: Needs assistance Sitting-balance support: Bilateral upper extremity supported Sitting balance-Leahy Scale: Fair     Standing balance support: Bilateral upper extremity supported Standing balance-Leahy Scale: Poor Standing balance comment: No LOB today but heavily depenant on walker.                            Cognition Arousal/Alertness: Awake/alert Behavior During Therapy: WFL for tasks assessed/performed Overall Cognitive Status: Within Functional Limits for tasks assessed                                        Exercises Other Exercises Other Exercises: BLE AROM x 10 in sitting.  LAQ, marches and ankle pumps.    General Comments        Pertinent Vitals/Pain Pain Assessment: No/denies pain    Home Living                      Prior Function  PT Goals (current goals can now be found in the care plan section) Progress towards PT goals: Progressing toward goals    Frequency    Min 2X/week      PT Plan Current plan remains appropriate    Co-evaluation              AM-PAC PT "6 Clicks" Daily Activity  Outcome Measure  Difficulty turning over in bed (including adjusting bedclothes, sheets and blankets)?: A Little Difficulty moving from lying on back to sitting on the side of the bed? : A Little Difficulty sitting down on and standing up from a chair with arms (e.g., wheelchair, bedside commode, etc,.)?: A Little Help needed moving to and from a bed to chair (including a wheelchair)?: A Little Help needed walking in hospital  room?: A Little Help needed climbing 3-5 steps with a railing? : Total 6 Click Score: 16    End of Session Equipment Utilized During Treatment: Gait belt Activity Tolerance: Patient tolerated treatment well;Patient limited by fatigue Patient left: with chair alarm set;with call bell/phone within reach Nurse Communication: Mobility status       Time: 1015-1030 PT Time Calculation (min) (ACUTE ONLY): 15 min  Charges:  $Gait Training: 8-22 mins                     Jessica Gardner, PTA 08/03/18, 10:39 AM

## 2018-08-03 NOTE — Discharge Summary (Deleted)
[facility] - [place] at [facility], 84 y.o., DOB November 14, 1929, MRN [id number]. Admission date: 07/31/2018 Discharge Date 08/04/2018 Primary MD Barbette Reichmann, MD Admitting Physician Oralia Manis, MD  Admission Diagnosis  Weakness [R53.1] Vertebral artery dissection [facility] [I77.74]  Discharge Diagnosis   Principal Problem:   Vertebral artery dissection (HCC) Urinary retention   Diabetes (HCC)   Dementia (HCC)   HTN (hypertension)   Hyperlipidemia   CAD (coronary artery disease)          Hospital Course  Jessica Gardner  is a 84 y.o. female who presents with chief complaint as above.  Patient presents with 2 days of increased weakness.  Patient was unable to ambulate and was brought to the ER and was evaluated with a CT failed to show a stroke she underwent further work-up showed vertebral artery dissection therefore she was admitted to the hospital.  She was seen by vascular surgery who recommended continuing her Aggrenox.  No further recommendations were made.  Patient does not need any surgical intervention for this.  She is very weak and deconditioned need of rehab.  Also during hospitalization patient was having trouble with voiding she started on some Flomax she will have a Foley placed this needs to be removed in few days and trial of voiding should be done.  If symptoms persist she needs outpatient urology follow-up.             Consults  neurology, vascular surgery  Significant Tests:  See full reports for all details     Ct Angio Head W Or Wo Contrast  Result Date: 07/31/2018 CLINICAL DATA:  Generalized weakness. History of stroke, dementia, pacemaker. EXAM: CT ANGIOGRAPHY HEAD AND NECK TECHNIQUE: Multidetector CT imaging of the head and neck was performed using the standard protocol during bolus administration of intravenous contrast. Multiplanar CT image reconstructions and MIPs were obtained to evaluate the vascular anatomy.  Carotid stenosis measurements (when applicable) are obtained utilizing NASCET criteria, using the distal internal carotid diameter as the denominator. CONTRAST:  75mL OMNIPAQUE IOHEXOL 350 MG/ML SOLN COMPARISON:  CT HEAD July 31, 2018 and carotid ultrasound September 09, 2017. FINDINGS: CTA NECK FINDINGS: AORTIC ARCH: Normal appearance of the thoracic arch, normal branch pattern. Mild calcific atherosclerosis aortic arch. The origins of the innominate, left Common carotid artery and subclavian artery are widely patent. RIGHT CAROTID SYSTEM: Common carotid artery is patent. Mild calcific atherosclerosis of the carotid bifurcation without hemodynamically significant stenosis by NASCET criteria. Patent internal carotid artery. LEFT CAROTID SYSTEM: Common carotid artery is patent. Mild calcific atherosclerosis of the carotid bifurcation without hemodynamically significant stenosis by NASCET criteria. Patent internal carotid artery. VERTEBRAL ARTERIES:RIGHT vertebral artery is dominant. Severe luminal irregularity LEFT vertebral artery with loss of contrast opacification LEFT V3 segment. No discrete dissection flap or pseudoaneurysm. SKELETON: No acute osseous process though bone windows have not been submitted. OTHER NECK: Soft tissues of the neck are nonacute though, not tailored for evaluation. UPPER CHEST: Included lung apices are clear. No superior mediastinal lymphadenopathy. LEFT cardiac pacemaker via subclavian venous approach. CTA HEAD FINDINGS: ANTERIOR CIRCULATION: Patent cervical internal carotid arteries, petrous, cavernous and supra clinoid internal carotid arteries. Severe calcific atherosclerosis carotid siphons. Patent anterior communicating artery. Patent anterior and middle cerebral arteries. Moderate stenosis RIGHT A2 segment. Moderate bilateral M1 segment stenosis. Moderate stenosis LEFT M2 segment. Moderate stenosis RIGHT M3 segment. No large vessel occlusion, significant stenosis, contrast  extravasation or aneurysm. POSTERIOR CIRCULATION: Complete loss of LEFT vertebral artery  contrast opacification. Patent RIGHT vertebral arteries mild calcific atherosclerosis. Moderate basilar artery tandem stenosis. Patent posterior cerebral arteries with moderate tandem stenosis. No large vessel occlusion, significant stenosis, contrast extravasation or aneurysm. VENOUS SINUSES: Major dural venous sinuses are patent though not tailored for evaluation on this angiographic examination. ANATOMIC VARIANTS: None. DELAYED PHASE: No abnormal intracranial enhancement. MIP images reviewed. IMPRESSION: CTA NECK: 1.  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 2. No hemodynamically significant stenosis internal carotid arteries. CTA HEAD: 1. Occluded LEFT vertebral artery without reconstitution. 2. No emergent large vessel occlusion. Multifocal moderate cerebral artery stenosis compatible with atherosclerosis. Acute findings discussed with and reconfirmed by Dr.PATRICK ROBINSON on 07/31/2018 at 9:26 pm. Aortic Atherosclerosis (ICD10-I70.0). Electronically Signed   By: Awilda Metro M.D.   On: 07/31/2018 21:27   Ct Head Wo Contrast  Result Date: 07/31/2018 CLINICAL DATA:  84 year old female with generalized weakness and dizziness today. History of prior stroke. EXAM: CT HEAD WITHOUT CONTRAST TECHNIQUE: Contiguous axial images were obtained from the base of the skull through the vertex without intravenous contrast. COMPARISON:  02/21/2018 head CT. FINDINGS: Brain: No intracranial hemorrhage or CT evidence of large acute infarct. Prominent chronic microvascular changes. Global atrophy. Ventricular prominence most likely related to atrophy rather than hydrocephalus and without change. Aqueduct is patent. Prominent dural calcification lateral to the temporal lobes bilaterally. Meningiomas felt less likely consideration. Findings stable. Vascular: Vascular calcifications.  No acute hyperdense vessel. Skull:  No acute abnormality. Sinuses/Orbits: No acute orbital abnormality. Visualized paranasal sinuses clear. Other: Mastoid air cells and middle ear cavities are clear. IMPRESSION: 1. No acute intracranial abnormality noted. 2. Prominent chronic microvascular changes. 3. Global atrophy. Electronically Signed   By: Lacy Duverney M.D.   On: 07/31/2018 18:45   Ct Angio Neck W Or Wo Contrast  Result Date: 07/31/2018 CLINICAL DATA:  Generalized weakness. History of stroke, dementia, pacemaker. EXAM: CT ANGIOGRAPHY HEAD AND NECK TECHNIQUE: Multidetector CT imaging of the head and neck was performed using the standard protocol during bolus administration of intravenous contrast. Multiplanar CT image reconstructions and MIPs were obtained to evaluate the vascular anatomy. Carotid stenosis measurements (when applicable) are obtained utilizing NASCET criteria, using the distal internal carotid diameter as the denominator. CONTRAST:  75mL OMNIPAQUE IOHEXOL 350 MG/ML SOLN COMPARISON:  CT HEAD July 31, 2018 and carotid ultrasound September 09, 2017. FINDINGS: CTA NECK FINDINGS: AORTIC ARCH: Normal appearance of the thoracic arch, normal branch pattern. Mild calcific atherosclerosis aortic arch. The origins of the innominate, left Common carotid artery and subclavian artery are widely patent. RIGHT CAROTID SYSTEM: Common carotid artery is patent. Mild calcific atherosclerosis of the carotid bifurcation without hemodynamically significant stenosis by NASCET criteria. Patent internal carotid artery. LEFT CAROTID SYSTEM: Common carotid artery is patent. Mild calcific atherosclerosis of the carotid bifurcation without hemodynamically significant stenosis by NASCET criteria. Patent internal carotid artery. VERTEBRAL ARTERIES:RIGHT vertebral artery is dominant. Severe luminal irregularity LEFT vertebral artery with loss of contrast opacification LEFT V3 segment. No discrete dissection flap or pseudoaneurysm. SKELETON: No acute  osseous process though bone windows have not been submitted. OTHER NECK: Soft tissues of the neck are nonacute though, not tailored for evaluation. UPPER CHEST: Included lung apices are clear. No superior mediastinal lymphadenopathy. LEFT cardiac pacemaker via subclavian venous approach. CTA HEAD FINDINGS: ANTERIOR CIRCULATION: Patent cervical internal carotid arteries, petrous, cavernous and supra clinoid internal carotid arteries. Severe calcific atherosclerosis carotid siphons. Patent anterior communicating artery. Patent anterior and middle cerebral arteries. Moderate stenosis RIGHT A2 segment. Moderate  bilateral M1 segment stenosis. Moderate stenosis LEFT M2 segment. Moderate stenosis RIGHT M3 segment. No large vessel occlusion, significant stenosis, contrast extravasation or aneurysm. POSTERIOR CIRCULATION: Complete loss of LEFT vertebral artery contrast opacification. Patent RIGHT vertebral arteries mild calcific atherosclerosis. Moderate basilar artery tandem stenosis. Patent posterior cerebral arteries with moderate tandem stenosis. No large vessel occlusion, significant stenosis, contrast extravasation or aneurysm. VENOUS SINUSES: Major dural venous sinuses are patent though not tailored for evaluation on this angiographic examination. ANATOMIC VARIANTS: None. DELAYED PHASE: No abnormal intracranial enhancement. MIP images reviewed. IMPRESSION: CTA NECK: 1.  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 2. No hemodynamically significant stenosis internal carotid arteries. CTA HEAD: 1. Occluded LEFT vertebral artery without reconstitution. 2. No emergent large vessel occlusion. Multifocal moderate cerebral artery stenosis compatible with atherosclerosis. Acute findings discussed with and reconfirmed by Dr.PATRICK ROBINSON on 07/31/2018 at 9:26 pm. Aortic Atherosclerosis (ICD10-I70.0). Electronically Signed   By: Awilda Metroourtnay  Bloomer M.D.   On: 07/31/2018 21:27   Ct Lumbar Spine Wo  Contrast  Result Date: 08/01/2018 CLINICAL DATA:  Increased weakness for 2 days. Weakness in bilateral lower extremities EXAM: CT LUMBAR SPINE WITHOUT CONTRAST TECHNIQUE: Multidetector CT imaging of the lumbar spine was performed without intravenous contrast administration. Multiplanar CT image reconstructions were also generated. COMPARISON:  None. FINDINGS: Segmentation: 5 lumbar type vertebrae. Alignment: 2 mm anterolisthesis of L4 on L5. Vertebrae: No acute fracture or focal pathologic process. Generalized osteopenia. Paraspinal and other soft tissues: No acute paraspinal abnormality. Abdominal aortic atherosclerosis. Contrast material in the renal collecting system from recent CT a of head. Disc levels: Disc spaces are maintained. T12-L1: Minimal broad-based disc bulge. Mild bilateral facet arthropathy. L1-L2: Mild broad-based disc bulge. Mild bilateral facet arthropathy. L2-L3: Mild broad-based disc bulge. Mild bilateral facet arthropathy. L3-L4: Broad-based disc bulge.  Mild bilateral facet arthropathy. L4-L5: Broad-based disc bulge. Severe bilateral facet arthropathy with ligamentum flavum infolding. Severe spinal stenosis. No foraminal stenosis. L5-S1: Mild broad-based disc bulge. Severe bilateral facet arthropathy. No foraminal stenosis. IMPRESSION: 1.  No acute osseous injury of the lumbar spine. 2. At L4-5 there is a broad-based disc bulge. Severe bilateral facet arthropathy with ligamentum flavum infolding. Severe spinal stenosis. Electronically Signed   By: Elige KoHetal  Bryten Maher   On: 08/01/2018 12:21       Today   Subjective:   Jessica Gardner patient doing much better denies any complaint Objective:   Blood pressure 140/75, pulse 89, temperature 98.3 F (36.8 C), temperature source Oral, resp. rate 20, height 5' (1.524 m), weight 58.3 kg, SpO2 97 %.  .  Intake/Output Summary (Last 24 hours) at 08/04/2018 1039 Last data filed at 08/04/2018 1032 Gross per 24 hour  Intake 240 ml  Output 350  ml  Net -110 ml    Exam VITAL SIGNS: Blood pressure 140/75, pulse 89, temperature 98.3 F (36.8 C), temperature source Oral, resp. rate 20, height 5' (1.524 m), weight 58.3 kg, SpO2 97 %.  GENERAL:  84 y.o.-year-old patient lying in the bed with no acute distress.  EYES: Pupils equal, round, reactive to light and accommodation. No scleral icterus. Extraocular muscles intact.  HEENT: Head atraumatic, normocephalic. Oropharynx and nasopharynx clear.  NECK:  Supple, no jugular venous distention. No thyroid enlargement, no tenderness.  LUNGS: Normal breath sounds bilaterally, no wheezing, rales,rhonchi or crepitation. No use of accessory muscles of respiration.  CARDIOVASCULAR: S1, S2 normal. No murmurs, rubs, or gallops.  ABDOMEN: Soft, nontender, nondistended. Bowel sounds present. No organomegaly or mass.  EXTREMITIES: No pedal  edema, cyanosis, or clubbing.  NEUROLOGIC: Cranial nerves II through XII are intact. Muscle strength 5/5 in all extremities. Sensation intact. Gait not checked.  PSYCHIATRIC: The patient is alert and oriented x 3.  SKIN: No obvious rash, lesion, or ulcer.   Data Review     CBC w Diff:  Lab Results  Component Value Date   WBC 5.5 08/01/2018   HGB 10.1 (L) 08/01/2018   HGB 12.4 08/27/2012   HCT 30.6 (L) 08/01/2018   HCT 36.8 08/27/2012   PLT 308 08/01/2018   PLT 375 08/27/2012   LYMPHOPCT 52 01/01/2018   MONOPCT 7 01/01/2018   EOSPCT 4 01/01/2018   BASOPCT 1 01/01/2018   CMP:  Lab Results  Component Value Date   NA 142 08/01/2018   NA 142 08/27/2012   K 3.6 08/01/2018   K 3.7 08/27/2012   CL 106 08/01/2018   CL 109 (H) 08/27/2012   CO2 28 08/01/2018   CO2 28 08/27/2012   BUN 18 08/01/2018   BUN 10 08/27/2012   CREATININE 1.10 (H) 08/01/2018   CREATININE 0.78 08/27/2012   PROT 8.2 (H) 07/31/2018   PROT 8.7 (H) 08/27/2012   ALBUMIN 4.2 07/31/2018   ALBUMIN 4.3 08/27/2012   BILITOT 0.9 07/31/2018   BILITOT 0.5 08/27/2012   ALKPHOS 65  07/31/2018   ALKPHOS 75 08/27/2012   AST 30 07/31/2018   AST 27 08/27/2012   ALT 18 07/31/2018   ALT 21 08/27/2012  .  Micro Results No results found for this or any previous visit (from the past 240 hour(s)).      Code Status Orders  (From admission, onward)         Start     Ordered   08/01/18 1330  Do not attempt resuscitation (DNR)  Continuous    Question Answer Comment  In the event of cardiac or respiratory ARREST Do not call a "code blue"   In the event of cardiac or respiratory ARREST Do not perform Intubation, CPR, defibrillation or ACLS   In the event of cardiac or respiratory ARREST Use medication by any route, position, wound care, and other measures to relive pain and suffering. May use oxygen, suction and manual treatment of airway obstruction as needed for comfort.      08/01/18 1329        Code Status History    Date Active Date Inactive Code Status Order ID Comments User Context   07/31/2018 2352 08/01/2018 1329 Full Code [id number]  Oralia Manis, MD Inpatient   01/02/2018 0126 01/03/2018 1721 Full Code [id number]  Rometta Emery, MD ED   09/09/2017 0146 09/13/2017 1942 Full Code [id number]  Oralia Manis, MD Inpatient   09/17/2016 1557 09/18/2016 1728 Full Code [id number]  Katha Hamming, MD ED    Advance Directive Documentation     Most Recent Value  Type of Advance Directive  Healthcare Power of Attorney  Pre-existing out of facility DNR order (yellow form or pink MOST form)  -  "MOST" Form in Place?  -           Contact information for follow-up providers    Barbette Reichmann, MD Follow up in 1 week(s).   Specialty:  Internal Medicine Contact information: [facility] [address] [place] [address] [phone]            Contact information for after-discharge care    Destination    [facility] .   Service:  Skilled Nursing Contact information: [address] [place] [address] [phone]                  Discharge Medications   Allergies as of 08/04/2018   No Known Allergies     Medication List    STOP taking these medications   fluconazole 150 MG tablet Commonly known as:  DIFLUCAN     TAKE these medications   acetaminophen 325 MG tablet Commonly known as:  TYLENOL Take 2 tablets (650 mg total) by mouth every 4 (four) hours as needed for mild pain (or temp > 37.5 C (99.5 F)).   amLODipine 5 MG tablet Commonly known as:  NORVASC Take 5 mg by mouth daily.   dipyridamole-aspirin 200-25 MG 12hr capsule Commonly known as:  AGGRENOX Take 1 capsule by mouth 2 (two) times daily.   docusate sodium 100 MG capsule Commonly known as:  COLACE Take 2 capsules (200 mg total) by mouth 2 (two) times daily.   gabapentin 100 MG capsule Commonly known as:  NEURONTIN Take 100 mg by mouth at bedtime.   glipiZIDE 5 MG 24 hr tablet Commonly known as:  GLUCOTROL XL Take 5 mg by mouth daily.   INTEGRA F 125-1 MG Caps Take 1 capsule by mouth daily.   Ipratropium-Albuterol 20-100 MCG/ACT Aers respimat Commonly known as:  COMBIVENT Inhale 1 puff into the lungs every 6 (six) hours.   latanoprost 0.005 % ophthalmic solution Commonly known as:  XALATAN Place 1 drop into both eyes at bedtime.   lisinopril 20 MG tablet Commonly known as:  PRINIVIL,ZESTRIL Take 20 mg by mouth daily.   metFORMIN 500 MG tablet Commonly known as:  GLUCOPHAGE Take 500 mg by mouth 2 (two) times daily.   metoprolol succinate 50 MG 24 hr tablet Commonly known as:  TOPROL-XL Take 50 mg by mouth daily.   omeprazole 20 MG capsule Commonly known as:  PRILOSEC Take 20 mg by mouth daily.   PARoxetine 20 MG tablet Commonly known as:  PAXIL Take 20 mg by mouth daily.   polyethylene glycol packet Commonly known as:  MIRALAX / GLYCOLAX Take 17 g by mouth daily.   simvastatin 20 MG tablet Commonly known as:  ZOCOR Take 20 mg by  mouth at bedtime.   tamsulosin 0.4 MG Caps capsule Commonly known as:  FLOMAX Take 1 capsule (0.4 mg total) by mouth daily.   Vitamin D (Ergocalciferol) 1.25 MG (50000 UT) Caps capsule Commonly known as:  DRISDOL Take 50,000 Units by mouth every Sunday.          Total Time in preparing paper work, data evaluation and todays exam - 35 minutes  Auburn Bilberry M.D on 08/04/2018 at 10:39 AM [facility]   Office  [phone]

## 2018-08-03 NOTE — Progress Notes (Signed)
[facility] unsuccessful. Bladder scanned and in/out cathed per MD order, tolerated well. Will continue to monitor.

## 2018-08-04 DIAGNOSIS — I7774 Dissection of vertebral artery: Secondary | ICD-10-CM | POA: Diagnosis not present

## 2018-08-04 LAB — GLUCOSE, CAPILLARY
Glucose-Capillary: 134 mg/dL — ABNORMAL HIGH (ref 70–99)
Glucose-Capillary: 287 mg/dL — ABNORMAL HIGH (ref 70–99)

## 2018-08-04 MED ORDER — POLYETHYLENE GLYCOL 3350 17 G PO PACK: 17.0000 g | PACK | Freq: Every day | ORAL | 0 refills | Status: AC

## 2018-08-04 MED ORDER — FLEET ENEMA 7-19 GM/118ML RE ENEM
1.0000 | ENEMA | Freq: Once | RECTAL | Status: AC
  Administered 2018-08-04: 14:00:00 1 via RECTAL

## 2018-08-04 MED ORDER — POLYETHYLENE GLYCOL 3350 17 G PO PACK
17.0000 g | PACK | Freq: Every day | ORAL | Status: DC
  Administered 2018-08-04: 11:00:00 17 g via ORAL
  Filled 2018-08-04: qty 1

## 2018-08-04 MED ORDER — METOPROLOL SUCCINATE ER 50 MG PO TB24
50.0000 mg | ORAL_TABLET | Freq: Every day | ORAL | Status: DC
  Administered 2018-08-04: 50 mg via ORAL
  Filled 2018-08-04: qty 1

## 2018-08-04 MED ORDER — DOCUSATE SODIUM 100 MG PO CAPS: 200.0000 mg | ORAL_CAPSULE | Freq: Two times a day (BID) | ORAL | 0 refills | Status: AC

## 2018-08-04 MED ORDER — LACTULOSE 10 GM/15ML PO SOLN
30.0000 g | Freq: Once | ORAL | Status: AC
  Administered 2018-08-04: 30 g via ORAL
  Filled 2018-08-04: qty 60

## 2018-08-04 NOTE — Progress Notes (Signed)
Subjective: Patient remains stable. Still weak but was able to get to edge of bed without assist and use of rail. Caregiver at bedside state that she stood with minimal assistance and was able to transfer to recliner with PT. Per nursing staff, patient is having difficulty with urination. She is retaining urine per bladder scan requiring in and out catherterization. Pending discharge to SNF today  Objective: Current vital signs: BP 140/75 (BP Location: Right Arm)   Pulse 89   Temp 98.3 F (36.8 C) (Oral)   Resp 20   Ht 5' (1.524 m)   Wt 58.3 kg   SpO2 97%   BMI 25.10 kg/m  Vital signs in last 24 hours: Temp:  [97.7 F (36.5 C)-98.4 F (36.9 C)] 98.3 F (36.8 C) (11/22 0825) Pulse Rate:  [85-100] 89 (11/22 0825) Resp:  [18-21] 20 (11/22 0435) BP: (113-140)/(57-75) 140/75 (11/22 0435) SpO2:  [93 %-99 %] 97 % (11/22 0825)  Intake/Output from previous day: No intake/output data recorded. Intake/Output this shift: Total I/O In: 240 [P.O.:240] Out: 350 [Urine:350] Nutritional status:  Diet Order            Diet - low sodium heart healthy        Diet heart healthy/carb modified Room service appropriate? Yes; Fluid consistency: Thin  Diet effective now             Neurologic Exam:  Mental Status: Alert, oriented, thought content appropriate. Speech fluent without evidence of aphasia. Able to follow 3 step commands without difficulty. Attention span and concentration seemed appropriate  Cranial Nerves: II: Discs flat bilaterally; Visual fields grossly normal, pupils equal, round, reactive to light and accommodation III,IV, VI: ptosis not present, extra-ocular motions intact bilaterally V,VII:right facial droop, facial light touch sensationintact VIII: hearing normal bilaterally IX,X: gag reflex present XI: bilateral shoulder shrug XII: midline tongue extension Motor: Right :Upper extremity5/5Without pronator driftLeft: Upper extremity4/5 with  drift Right:Lower extremity4+/5able to break gravityLeft: Lower extremity4+/5able to break gravity Tone and bulk:normal tone throughout; no atrophy noted Sensory: Pinprick and light touchintact bilaterally Deep Tendon Reflexes: 2+ and symmetric throughout Plantars: Right:muteLeft: mute Cerebellar: Finger-to-nosetesting intact bilaterally. Gait: not tested due to safety concerns  Lab Results: Basic Metabolic Panel: Recent Labs  Lab 07/31/18 1456 08/01/18 0609  NA 143 142  K 4.4 3.6  CL 102 106  CO2 27 28  GLUCOSE 134* 155*  BUN 14 18  CREATININE 1.20* 1.10*  CALCIUM 9.8 9.1    Liver Function Tests: Recent Labs  Lab 07/31/18 1456  AST 30  ALT 18  ALKPHOS 65  BILITOT 0.9  PROT 8.2*  ALBUMIN 4.2   No results for input(s): LIPASE, AMYLASE in the last 168 hours. No results for input(s): AMMONIA in the last 168 hours.  CBC: Recent Labs  Lab 07/31/18 1456 08/01/18 0609  WBC 6.5 5.5  HGB 12.5 10.1*  HCT 38.0 30.6*  MCV 95.0 94.7  PLT 324 308    Cardiac Enzymes: Recent Labs  Lab 07/31/18 1456  TROPONINI <0.03    Lipid Panel: Recent Labs  Lab 08/01/18 0609  CHOL 113  TRIG 70  HDL 38*  CHOLHDL 3.0  VLDL 14  LDLCALC 61    CBG: Recent Labs  Lab 08/03/18 1155 08/03/18 1658 08/03/18 2108 08/04/18 0810 08/04/18 1158  GLUCAP 159* 144* 141* 134* 287*    Microbiology: Results for orders placed or performed during the hospital encounter of 05/18/18  Urine culture     Status: Abnormal   Collection  Time: 05/18/18  6:26 PM  Result Value Ref Range Status   Specimen Description   Final    URINE, RANDOM Performed at [facility], [address] [place], [place] [address]    Special Requests   Final    NONE Performed at [facility], [address] [place], [place] [address]    Culture MULTIPLE SPECIES PRESENT, SUGGEST RECOLLECTION (A)  Final    Report Status 05/20/2018 FINAL  Final    Coagulation Studies: No results for input(s): LABPROT, INR in the last 72 hours.  Imaging: No results found.  Medications:  I have reviewed the patient's current medications. Prior to Admission:  Medications Prior to Admission  Medication Sig Dispense Refill Last Dose  . acetaminophen (TYLENOL) 325 MG tablet Take 2 tablets (650 mg total) by mouth every 4 (four) hours as needed for mild pain (or temp > 37.5 C (99.5 F)).   unknown at unknown  . amLODipine (NORVASC) 5 MG tablet Take 5 mg by mouth daily.   unknown at unknown  . dipyridamole-aspirin (AGGRENOX) 200-25 MG 12hr capsule Take 1 capsule by mouth 2 (two) times daily.   unknown at unknown  . Fe Fum-FePoly-FA-Vit C-Vit B3 (INTEGRA F) 125-1 MG CAPS Take 1 capsule by mouth daily.   unknown at unknown  . gabapentin (NEURONTIN) 100 MG capsule Take 100 mg by mouth at bedtime.   unknown at unknown  . glipiZIDE (GLUCOTROL XL) 5 MG 24 hr tablet Take 5 mg by mouth daily.    unknown at unknown  . latanoprost (XALATAN) 0.005 % ophthalmic solution Place 1 drop into both eyes at bedtime.   unknown at unknown  . lisinopril (PRINIVIL,ZESTRIL) 20 MG tablet Take 20 mg by mouth daily.   unknown at unknown  . metFORMIN (GLUCOPHAGE) 500 MG tablet Take 500 mg by mouth 2 (two) times daily.   unknown at unknown  . metoprolol succinate (TOPROL-XL) 50 MG 24 hr tablet Take 50 mg by mouth daily.    unknown at unknown  . omeprazole (PRILOSEC) 20 MG capsule Take 20 mg by mouth daily.   unknown at unknown  . PARoxetine (PAXIL) 20 MG tablet Take 20 mg by mouth daily.   unknown at unknown  . simvastatin (ZOCOR) 20 MG tablet Take 20 mg by mouth at bedtime.    unknown at unknown  . Vitamin D, Ergocalciferol, (DRISDOL) 50000 units CAPS capsule Take 50,000 Units by mouth every Sunday.    unknown at unknown  . fluconazole (DIFLUCAN) 150 MG tablet Take 1 tablet (150 mg total) by mouth daily. (Patient not taking: Reported on  08/03/2018) 1 tablet 0 Not Taking at Unknown time  . Ipratropium-Albuterol (COMBIVENT RESPIMAT) 20-100 MCG/ACT AERS respimat Inhale 1 puff into the lungs every 6 (six) hours. (Patient not taking: Reported on 08/03/2018) 1 Inhaler 5 Not Taking at Unknown time   Scheduled: . dipyridamole-aspirin  1 capsule Oral BID  . docusate sodium  200 mg Oral BID  . enoxaparin (LOVENOX) injection  30 mg Subcutaneous Q24H  . gabapentin  100 mg Oral QHS  . insulin aspart  0-5 Units Subcutaneous QHS  . insulin aspart  0-9 Units Subcutaneous TID WC  . latanoprost  1 drop Both Eyes QHS  . metoprolol succinate  50 mg Oral Daily  . pantoprazole  40 mg Oral Daily  . PARoxetine  20 mg Oral Daily  . polyethylene glycol  17 g Oral Daily  . senna  1 tablet Oral Daily  . simvastatin  20 mg Oral Daily  . tamsulosin  0.4 mg Oral Daily   Patient seen and examined.  Clinical course and management discussed.  Necessary edits performed.  I agree with the above.  Assessment and plan of care developed and discussed below.   Assessment:84 y.o.femalepresenting with complaints of generalized weakness, dizziness and alteration of awareness. Symptoms now improved. CT head unremarkable. 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 No emergent large vessel occlusion noted.Vascular has signed off. CT lumbar spine obtained due to prominent lower extremity symptoms which showed L4-5 disc bulge with severe spinal stenosis. Unlikely cause of her symptoms but will need follow up as outpatient due to current issue with urinary retention.  Patient not a great surgical candidate.  Echocardiogram did not show cardiac source of emboli.  Plan: 1. Continue PT 2. Prophylactic therapy-Antiplatelet EAV:[id number] home doseofAggrenox 3. Recommend outpatient follow up with Neurology for further evaluation of spinal stenosis and possible acute infarct/TIA 4. Will need outpatient  cardiology evaluation for possible prolonged cardiac monitoring for evaluation of occult afib.  This patient was staffed with Dr. Verlon Au, Thad Ranger who personally evaluated patient, reviewed documentation and agreed with assessment and plan of care as above.  Webb Silversmith, DNP, FNP-BC Board certified Nurse Practitioner Neurology Department   LOS: 0 days   08/04/2018  12:48 PM   Thana Farr, MD Neurology [phone]  08/04/2018  1:36 PM

## 2018-08-04 NOTE — Progress Notes (Signed)
EMS called for transport. Anthonie Lotito S, RN  

## 2018-08-04 NOTE — Progress Notes (Signed)
Report called to [facility]. Tiaja Hagan S, RN  

## 2018-08-04 NOTE — Progress Notes (Signed)
[facility] No distress noted.

## 2018-08-04 NOTE — Discharge Summary (Addendum)
[facility] - [place] [facility]  Jessica Gardner, 84 y.o., DOB 02/18/1930, MRN [id number]. Admission date: 07/31/2018 Discharge Date 08/04/2018 Primary MD Hande, Vishwanath, MD Admitting Physician David Willis, MD  Admission Diagnosis  Weakness [R53.1] Vertebral artery dissection (HCC) [I77.74]  Discharge Diagnosis   Principal Problem:   Vertebral artery dissection (HCC) Urinary retention   Diabetes (HCC)   Dementia (HCC)   HTN (hypertension)   Hyperlipidemia   CAD (coronary artery disease)          Hospital Course  Jessica Gardner  is a 84 y.o. female who presents with chief complaint as above.  Jessica Gardner presents with 2 days of increased weakness.  Jessica Gardner was unable to ambulate and was brought to the ER and was evaluated with a CT failed to show a stroke she underwent further work-up showed vertebral artery dissection therefore she was admitted to the hospital.  She was seen by vascular surgery who recommended continuing her Aggrenox.  No further recommendations were made.  Jessica Gardner does not need any surgical intervention for this.  She is very weak and deconditioned need of rehab.  Also during hospitalization Jessica Gardner was having trouble with voiding she started on some Flomax she will have a Foley placed this needs to be removed in few days and trial of voiding should be done.  If symptoms persist she needs outpatient urology follow-up.             Consults  neurology, vascular surgery  Significant Tests:  See full reports for all details     Ct Angio Head W Or Wo Contrast  Result Date: 07/31/2018 CLINICAL DATA:  Generalized weakness. History of stroke, dementia, pacemaker. EXAM: CT ANGIOGRAPHY HEAD AND NECK TECHNIQUE: Multidetector CT imaging of the head and neck was performed using the standard protocol during bolus administration of intravenous contrast. Multiplanar CT image reconstructions and MIPs were obtained to evaluate the vascular anatomy.  Carotid stenosis measurements (when applicable) are obtained utilizing NASCET criteria, using the distal internal carotid diameter as the denominator. CONTRAST:  75mL OMNIPAQUE IOHEXOL 350 MG/ML SOLN COMPARISON:  CT HEAD July 31, 2018 and carotid ultrasound September 09, 2017. FINDINGS: CTA NECK FINDINGS: AORTIC ARCH: Normal appearance of the thoracic arch, normal branch pattern. Mild calcific atherosclerosis aortic arch. The origins of the innominate, left Common carotid artery and subclavian artery are widely patent. RIGHT CAROTID SYSTEM: Common carotid artery is patent. Mild calcific atherosclerosis of the carotid bifurcation without hemodynamically significant stenosis by NASCET criteria. Patent internal carotid artery. LEFT CAROTID SYSTEM: Common carotid artery is patent. Mild calcific atherosclerosis of the carotid bifurcation without hemodynamically significant stenosis by NASCET criteria. Patent internal carotid artery. VERTEBRAL ARTERIES:RIGHT vertebral artery is dominant. Severe luminal irregularity LEFT vertebral artery with loss of contrast opacification LEFT V3 segment. No discrete dissection flap or pseudoaneurysm. SKELETON: No acute osseous process though bone windows have not been submitted. OTHER NECK: Soft tissues of the neck are nonacute though, not tailored for evaluation. UPPER CHEST: Included lung apices are clear. No superior mediastinal lymphadenopathy. LEFT cardiac pacemaker via subclavian venous approach. CTA HEAD FINDINGS: ANTERIOR CIRCULATION: Patent cervical internal carotid arteries, petrous, cavernous and supra clinoid internal carotid arteries. Severe calcific atherosclerosis carotid siphons. Patent anterior communicating artery. Patent anterior and middle cerebral arteries. Moderate stenosis RIGHT A2 segment. Moderate bilateral M1 segment stenosis. Moderate stenosis LEFT M2 segment. Moderate stenosis RIGHT M3 segment. No large vessel occlusion, significant stenosis, contrast  extravasation or aneurysm. POSTERIOR CIRCULATION: Complete loss of LEFT vertebral artery   contrast opacification. Patent RIGHT vertebral arteries mild calcific atherosclerosis. Moderate basilar artery tandem stenosis. Patent posterior cerebral arteries with moderate tandem stenosis. No large vessel occlusion, significant stenosis, contrast extravasation or aneurysm. VENOUS SINUSES: Major dural venous sinuses are patent though not tailored for evaluation on this angiographic examination. ANATOMIC VARIANTS: None. DELAYED PHASE: No abnormal intracranial enhancement. MIP images reviewed. IMPRESSION: CTA NECK: 1.  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 2. No hemodynamically significant stenosis internal carotid arteries. CTA HEAD: 1. Occluded LEFT vertebral artery without reconstitution. 2. No emergent large vessel occlusion. Multifocal moderate cerebral artery stenosis compatible with atherosclerosis. Acute findings discussed with and reconfirmed by Dr.PATRICK ROBINSON on 07/31/2018 at 9:26 pm. Aortic Atherosclerosis (ICD10-I70.0). Electronically Signed   By: Courtnay  Bloomer M.D.   On: 07/31/2018 21:27   Ct Head Wo Contrast  Result Date: 07/31/2018 CLINICAL DATA:  84-year-old female with generalized weakness and dizziness today. History of prior stroke. EXAM: CT HEAD WITHOUT CONTRAST TECHNIQUE: Contiguous axial images were obtained from the base of the skull through the vertex without intravenous contrast. COMPARISON:  02/21/2018 head CT. FINDINGS: Brain: No intracranial hemorrhage or CT evidence of large acute infarct. Prominent chronic microvascular changes. Global atrophy. Ventricular prominence most likely related to atrophy rather than hydrocephalus and without change. Aqueduct is patent. Prominent dural calcification lateral to the temporal lobes bilaterally. Meningiomas felt less likely consideration. Findings stable. Vascular: Vascular calcifications.  No acute hyperdense vessel. Skull:  No acute abnormality. Sinuses/Orbits: No acute orbital abnormality. Visualized paranasal sinuses clear. Other: Mastoid air cells and middle ear cavities are clear. IMPRESSION: 1. No acute intracranial abnormality noted. 2. Prominent chronic microvascular changes. 3. Global atrophy. Electronically Signed   By: Steven  Olson M.D.   On: 07/31/2018 18:45   Ct Angio Neck W Or Wo Contrast  Result Date: 07/31/2018 CLINICAL DATA:  Generalized weakness. History of stroke, dementia, pacemaker. EXAM: CT ANGIOGRAPHY HEAD AND NECK TECHNIQUE: Multidetector CT imaging of the head and neck was performed using the standard protocol during bolus administration of intravenous contrast. Multiplanar CT image reconstructions and MIPs were obtained to evaluate the vascular anatomy. Carotid stenosis measurements (when applicable) are obtained utilizing NASCET criteria, using the distal internal carotid diameter as the denominator. CONTRAST:  75mL OMNIPAQUE IOHEXOL 350 MG/ML SOLN COMPARISON:  CT HEAD July 31, 2018 and carotid ultrasound September 09, 2017. FINDINGS: CTA NECK FINDINGS: AORTIC ARCH: Normal appearance of the thoracic arch, normal branch pattern. Mild calcific atherosclerosis aortic arch. The origins of the innominate, left Common carotid artery and subclavian artery are widely patent. RIGHT CAROTID SYSTEM: Common carotid artery is patent. Mild calcific atherosclerosis of the carotid bifurcation without hemodynamically significant stenosis by NASCET criteria. Patent internal carotid artery. LEFT CAROTID SYSTEM: Common carotid artery is patent. Mild calcific atherosclerosis of the carotid bifurcation without hemodynamically significant stenosis by NASCET criteria. Patent internal carotid artery. VERTEBRAL ARTERIES:RIGHT vertebral artery is dominant. Severe luminal irregularity LEFT vertebral artery with loss of contrast opacification LEFT V3 segment. No discrete dissection flap or pseudoaneurysm. SKELETON: No acute  osseous process though bone windows have not been submitted. OTHER NECK: Soft tissues of the neck are nonacute though, not tailored for evaluation. UPPER CHEST: Included lung apices are clear. No superior mediastinal lymphadenopathy. LEFT cardiac pacemaker via subclavian venous approach. CTA HEAD FINDINGS: ANTERIOR CIRCULATION: Patent cervical internal carotid arteries, petrous, cavernous and supra clinoid internal carotid arteries. Severe calcific atherosclerosis carotid siphons. Patent anterior communicating artery. Patent anterior and middle cerebral arteries. Moderate stenosis RIGHT A2 segment. Moderate   bilateral M1 segment stenosis. Moderate stenosis LEFT M2 segment. Moderate stenosis RIGHT M3 segment. No large vessel occlusion, significant stenosis, contrast extravasation or aneurysm. POSTERIOR CIRCULATION: Complete loss of LEFT vertebral artery contrast opacification. Patent RIGHT vertebral arteries mild calcific atherosclerosis. Moderate basilar artery tandem stenosis. Patent posterior cerebral arteries with moderate tandem stenosis. No large vessel occlusion, significant stenosis, contrast extravasation or aneurysm. VENOUS SINUSES: Major dural venous sinuses are patent though not tailored for evaluation on this angiographic examination. ANATOMIC VARIANTS: None. DELAYED PHASE: No abnormal intracranial enhancement. MIP images reviewed. IMPRESSION: CTA NECK: 1.  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 2. No hemodynamically significant stenosis internal carotid arteries. CTA HEAD: 1. Occluded LEFT vertebral artery without reconstitution. 2. No emergent large vessel occlusion. Multifocal moderate cerebral artery stenosis compatible with atherosclerosis. Acute findings discussed with and reconfirmed by Dr.PATRICK ROBINSON on 07/31/2018 at 9:26 pm. Aortic Atherosclerosis (ICD10-I70.0). Electronically Signed   By: Courtnay  Bloomer M.D.   On: 07/31/2018 21:27   Ct Lumbar Spine Wo  Contrast  Result Date: 08/01/2018 CLINICAL DATA:  Increased weakness for 2 days. Weakness in bilateral lower extremities EXAM: CT LUMBAR SPINE WITHOUT CONTRAST TECHNIQUE: Multidetector CT imaging of the lumbar spine was performed without intravenous contrast administration. Multiplanar CT image reconstructions were also generated. COMPARISON:  None. FINDINGS: Segmentation: 5 lumbar type vertebrae. Alignment: 2 mm anterolisthesis of L4 on L5. Vertebrae: No acute fracture or focal pathologic process. Generalized osteopenia. Paraspinal and other soft tissues: No acute paraspinal abnormality. Abdominal aortic atherosclerosis. Contrast material in the renal collecting system from recent CT a of head. Disc levels: Disc spaces are maintained. T12-L1: Minimal broad-based disc bulge. Mild bilateral facet arthropathy. L1-L2: Mild broad-based disc bulge. Mild bilateral facet arthropathy. L2-L3: Mild broad-based disc bulge. Mild bilateral facet arthropathy. L3-L4: Broad-based disc bulge.  Mild bilateral facet arthropathy. L4-L5: Broad-based disc bulge. Severe bilateral facet arthropathy with ligamentum flavum infolding. Severe spinal stenosis. No foraminal stenosis. L5-S1: Mild broad-based disc bulge. Severe bilateral facet arthropathy. No foraminal stenosis. IMPRESSION: 1.  No acute osseous injury of the lumbar spine. 2. At L4-5 there is a broad-based disc bulge. Severe bilateral facet arthropathy with ligamentum flavum infolding. Severe spinal stenosis. Electronically Signed   By: Hetal  Hue Frick   On: 08/01/2018 12:21       Today   Subjective:   Jessica Gardner Jessica Gardner doing much better denies any complaint Objective:   Blood pressure 140/75, pulse 89, temperature 98.3 F (36.8 C), temperature source Oral, resp. rate 20, height 5' (1.524 m), weight 58.3 kg, SpO2 97 %.  .  Intake/Output Summary (Last 24 hours) at 08/04/2018 1039 Last data filed at 08/04/2018 1032 Gross per 24 hour  Intake 240 ml  Output 350  ml  Net -110 ml    Exam VITAL SIGNS: Blood pressure 140/75, pulse 89, temperature 98.3 F (36.8 C), temperature source Oral, resp. rate 20, height 5' (1.524 m), weight 58.3 kg, SpO2 97 %.  GENERAL:  84 y.o.-year-old Jessica Gardner lying in the bed with no acute distress.  EYES: Pupils equal, round, reactive to light and accommodation. No scleral icterus. Extraocular muscles intact.  HEENT: Head atraumatic, normocephalic. Oropharynx and nasopharynx clear.  NECK:  Supple, no jugular venous distention. No thyroid enlargement, no tenderness.  LUNGS: Normal breath sounds bilaterally, no wheezing, rales,rhonchi or crepitation. No use of accessory muscles of respiration.  CARDIOVASCULAR: S1, S2 normal. No murmurs, rubs, or gallops.  ABDOMEN: Soft, nontender, nondistended. Bowel sounds present. No organomegaly or mass.  EXTREMITIES: No pedal   edema, cyanosis, or clubbing.  NEUROLOGIC: Cranial nerves II through XII are intact. Muscle strength 5/5 in all extremities. Sensation intact. Gait not checked.  PSYCHIATRIC: The Jessica Gardner is alert and oriented x 3.  SKIN: No obvious rash, lesion, or ulcer.   Data Review     CBC w Diff:  Lab Results  Component Value Date   WBC 5.5 08/01/2018   HGB 10.1 (L) 08/01/2018   HGB 12.4 08/27/2012   HCT 30.6 (L) 08/01/2018   HCT 36.8 08/27/2012   PLT 308 08/01/2018   PLT 375 08/27/2012   LYMPHOPCT 52 01/01/2018   MONOPCT 7 01/01/2018   EOSPCT 4 01/01/2018   BASOPCT 1 01/01/2018   CMP:  Lab Results  Component Value Date   NA 142 08/01/2018   NA 142 08/27/2012   K 3.6 08/01/2018   K 3.7 08/27/2012   CL 106 08/01/2018   CL 109 (H) 08/27/2012   CO2 28 08/01/2018   CO2 28 08/27/2012   BUN 18 08/01/2018   BUN 10 08/27/2012   CREATININE 1.10 (H) 08/01/2018   CREATININE 0.78 08/27/2012   PROT 8.2 (H) 07/31/2018   PROT 8.7 (H) 08/27/2012   ALBUMIN 4.2 07/31/2018   ALBUMIN 4.3 08/27/2012   BILITOT 0.9 07/31/2018   BILITOT 0.5 08/27/2012   ALKPHOS 65  07/31/2018   ALKPHOS 75 08/27/2012   AST 30 07/31/2018   AST 27 08/27/2012   ALT 18 07/31/2018   ALT 21 08/27/2012  .  Micro Results No results found for this or any previous visit (from the past 240 hour(s)).      Code Status Orders  (From admission, onward)         Start     Ordered   08/01/18 1330  Do not attempt resuscitation (DNR)  Continuous    Question Answer Comment  In the event of cardiac or respiratory ARREST Do not call a "code blue"   In the event of cardiac or respiratory ARREST Do not perform Intubation, CPR, defibrillation or ACLS   In the event of cardiac or respiratory ARREST Use medication by any route, position, wound care, and other measures to relive pain and suffering. May use oxygen, suction and manual treatment of airway obstruction as needed for comfort.      08/01/18 1329        Code Status History    Date Active Date Inactive Code Status Order ID Comments User Context   07/31/2018 2352 08/01/2018 1329 Full Code [id number]  Willis, David, MD Inpatient   01/02/2018 0126 01/03/2018 1721 Full Code [id number]  Garba, Mohammad L, MD ED   09/09/2017 0146 09/13/2017 1942 Full Code [id number]  Willis, David, MD Inpatient   09/17/2016 1557 09/18/2016 1728 Full Code [id number]  Konidena, Snehalatha, MD ED    Advance Directive Documentation     Most Recent Value  Type of Advance Directive  Healthcare Power of Attorney  Pre-existing out of facility DNR order (yellow form or pink MOST form)  -  "MOST" Form in Place?  -           Contact information for follow-up providers    Hande, Vishwanath, MD Follow up in 1 week(s).   Specialty:  Internal Medicine Contact information: [facility] [address] [place] [address] [phone]            Contact information for after-discharge care    Destination    [facility]  Preferred SNF .   Service:    Skilled Nursing Contact information: [address] [place] [address] [phone]                  Discharge Medications   Allergies as of 08/04/2018   No Known Allergies     Medication List    STOP taking these medications   fluconazole 150 MG tablet Commonly known as:  DIFLUCAN     TAKE these medications   acetaminophen 325 MG tablet Commonly known as:  TYLENOL Take 2 tablets (650 mg total) by mouth every 4 (four) hours as needed for mild pain (or temp > 37.5 C (99.5 F)).   amLODipine 5 MG tablet Commonly known as:  NORVASC Take 5 mg by mouth daily.   dipyridamole-aspirin 200-25 MG 12hr capsule Commonly known as:  AGGRENOX Take 1 capsule by mouth 2 (two) times daily.   docusate sodium 100 MG capsule Commonly known as:  COLACE Take 2 capsules (200 mg total) by mouth 2 (two) times daily.   gabapentin 100 MG capsule Commonly known as:  NEURONTIN Take 100 mg by mouth at bedtime.   glipiZIDE 5 MG 24 hr tablet Commonly known as:  GLUCOTROL XL Take 5 mg by mouth daily.   INTEGRA F 125-1 MG Caps Take 1 capsule by mouth daily.   Ipratropium-Albuterol 20-100 MCG/ACT Aers respimat Commonly known as:  COMBIVENT Inhale 1 puff into the lungs every 6 (six) hours.   latanoprost 0.005 % ophthalmic solution Commonly known as:  XALATAN Place 1 drop into both eyes at bedtime.   lisinopril 20 MG tablet Commonly known as:  PRINIVIL,ZESTRIL Take 20 mg by mouth daily.   metFORMIN 500 MG tablet Commonly known as:  GLUCOPHAGE Take 500 mg by mouth 2 (two) times daily.   metoprolol succinate 50 MG 24 hr tablet Commonly known as:  TOPROL-XL Take 50 mg by mouth daily.   omeprazole 20 MG capsule Commonly known as:  PRILOSEC Take 20 mg by mouth daily.   PARoxetine 20 MG tablet Commonly known as:  PAXIL Take 20 mg by mouth daily.   polyethylene glycol packet Commonly known as:  MIRALAX / GLYCOLAX Take 17 g by mouth daily.   simvastatin 20 MG tablet Commonly known as:  ZOCOR Take 20 mg by  mouth at bedtime.   tamsulosin 0.4 MG Caps capsule Commonly known as:  FLOMAX Take 1 capsule (0.4 mg total) by mouth daily.   Vitamin D (Ergocalciferol) 1.25 MG (50000 UT) Caps capsule Commonly known as:  DRISDOL Take 50,000 Units by mouth every Sunday.          Total Time in preparing paper work, data evaluation and todays exam - 35 minutes  Chayil Gantt M.D on 08/04/2018 at 10:39 AM [facility]   Office  [phone] 

## 2018-08-04 NOTE — Clinical Social Work Note (Signed)
Patient is medically ready for discharge today. CSW notified patient's [facility] of [facility] CS[facility]. Patient will be transported by EMS. RN to call report and call for transport.   Ruthe Mannanandace Niamh Rada MSW, [address]CSWA [phone]

## 2018-10-23 ENCOUNTER — Other Ambulatory Visit: Payer: Self-pay

## 2018-10-23 ENCOUNTER — Emergency Department: Payer: Medicare Other

## 2018-10-23 ENCOUNTER — Encounter: Payer: Self-pay | Admitting: Emergency Medicine

## 2018-10-23 ENCOUNTER — Inpatient Hospital Stay
Admission: EM | Admit: 2018-10-23 | Discharge: 2018-10-24 | DRG: 065 | Disposition: A | Discharge status: Skilled Nursing Facility | Payer: Medicare Other | Source: Skilled Nursing Facility | Attending: Internal Medicine | Admitting: Internal Medicine

## 2018-10-23 DIAGNOSIS — E875 Hyperkalemia: Secondary | ICD-10-CM | POA: Diagnosis present

## 2018-10-23 DIAGNOSIS — Z90710 Acquired absence of both cervix and uterus: Secondary | ICD-10-CM | POA: Diagnosis not present

## 2018-10-23 DIAGNOSIS — Z993 Dependence on wheelchair: Secondary | ICD-10-CM | POA: Diagnosis not present

## 2018-10-23 DIAGNOSIS — I639 Cerebral infarction, unspecified: Secondary | ICD-10-CM | POA: Diagnosis present

## 2018-10-23 DIAGNOSIS — Z66 Do not resuscitate: Secondary | ICD-10-CM | POA: Diagnosis present

## 2018-10-23 DIAGNOSIS — I2510 Atherosclerotic heart disease of native coronary artery without angina pectoris: Secondary | ICD-10-CM | POA: Diagnosis present

## 2018-10-23 DIAGNOSIS — H409 Unspecified glaucoma: Secondary | ICD-10-CM | POA: Diagnosis present

## 2018-10-23 DIAGNOSIS — Z7984 Long term (current) use of oral hypoglycemic drugs: Secondary | ICD-10-CM | POA: Diagnosis not present

## 2018-10-23 DIAGNOSIS — N183 Chronic kidney disease, stage 3 (moderate): Secondary | ICD-10-CM | POA: Diagnosis present

## 2018-10-23 DIAGNOSIS — F0390 Unspecified dementia without behavioral disturbance: Secondary | ICD-10-CM | POA: Diagnosis present

## 2018-10-23 DIAGNOSIS — I69354 Hemiplegia and hemiparesis following cerebral infarction affecting left non-dominant side: Secondary | ICD-10-CM | POA: Diagnosis not present

## 2018-10-23 DIAGNOSIS — Z79899 Other long term (current) drug therapy: Secondary | ICD-10-CM | POA: Diagnosis not present

## 2018-10-23 DIAGNOSIS — I252 Old myocardial infarction: Secondary | ICD-10-CM

## 2018-10-23 DIAGNOSIS — E1122 Type 2 diabetes mellitus with diabetic chronic kidney disease: Secondary | ICD-10-CM | POA: Diagnosis present

## 2018-10-23 DIAGNOSIS — N390 Urinary tract infection, site not specified: Secondary | ICD-10-CM | POA: Diagnosis present

## 2018-10-23 DIAGNOSIS — E785 Hyperlipidemia, unspecified: Secondary | ICD-10-CM | POA: Diagnosis present

## 2018-10-23 DIAGNOSIS — R29810 Facial weakness: Secondary | ICD-10-CM | POA: Diagnosis present

## 2018-10-23 DIAGNOSIS — E1151 Type 2 diabetes mellitus with diabetic peripheral angiopathy without gangrene: Secondary | ICD-10-CM | POA: Diagnosis present

## 2018-10-23 DIAGNOSIS — Z803 Family history of malignant neoplasm of breast: Secondary | ICD-10-CM

## 2018-10-23 DIAGNOSIS — Z950 Presence of cardiac pacemaker: Secondary | ICD-10-CM

## 2018-10-23 LAB — COMPREHENSIVE METABOLIC PANEL
ALT: 19 U/L (ref 0–44)
AST: 24 U/L (ref 15–41)
Albumin: 3.7 g/dL (ref 3.5–5.0)
Alkaline Phosphatase: 56 U/L (ref 38–126)
Anion gap: 11 (ref 5–15)
BUN: 20 mg/dL (ref 8–23)
CHLORIDE: 106 mmol/L (ref 98–111)
CO2: 25 mmol/L (ref 22–32)
Calcium: 9.4 mg/dL (ref 8.9–10.3)
Creatinine, Ser: 1.25 mg/dL — ABNORMAL HIGH (ref 0.44–1.00)
GFR calc Af Amer: 45 mL/min — ABNORMAL LOW (ref >60)
GFR calc non Af Amer: 39 mL/min — ABNORMAL LOW (ref >60)
Glucose, Bld: 129 mg/dL — ABNORMAL HIGH (ref 70–99)
Potassium: 5 mmol/L (ref 3.5–5.1)
Sodium: 142 mmol/L (ref 135–145)
Total Bilirubin: 0.7 mg/dL (ref 0.3–1.2)
Total Protein: 7.5 g/dL (ref 6.5–8.1)

## 2018-10-23 LAB — CBC
HCT: 32.2 % — ABNORMAL LOW (ref 36.0–46.0)
Hemoglobin: 10.3 g/dL — ABNORMAL LOW (ref 12.0–15.0)
MCH: 30.6 pg (ref 26.0–34.0)
MCHC: 32 g/dL (ref 30.0–36.0)
MCV: 95.5 fL (ref 80.0–100.0)
Platelets: 397 10*3/uL (ref 150–400)
RBC: 3.37 MIL/uL — ABNORMAL LOW (ref 3.87–5.11)
RDW: 13.3 % (ref 11.5–15.5)
WBC: 7.2 10*3/uL (ref 4.0–10.5)
nRBC: 0 % (ref 0.0–0.2)

## 2018-10-23 LAB — ETHANOL: Alcohol, Ethyl (B): <10 mg/dL (ref <10)

## 2018-10-23 LAB — DIFFERENTIAL
Abs Immature Granulocytes: 0.01 10*3/uL (ref 0.00–0.07)
BASOS PCT: 1 %
Basophils Absolute: 0.1 10*3/uL (ref 0.0–0.1)
Eosinophils Absolute: 0.4 10*3/uL (ref 0.0–0.5)
Eosinophils Relative: 6 %
Immature Granulocytes: 0 %
Lymphocytes Relative: 43 %
Lymphs Abs: 3.1 10*3/uL (ref 0.7–4.0)
Monocytes Absolute: 0.7 10*3/uL (ref 0.1–1.0)
Monocytes Relative: 10 %
NEUTROS PCT: 40 %
Neutro Abs: 2.8 10*3/uL (ref 1.7–7.7)

## 2018-10-23 LAB — URINALYSIS, ROUTINE W REFLEX MICROSCOPIC
Bilirubin Urine: NEGATIVE
GLUCOSE, UA: NEGATIVE mg/dL
Hgb urine dipstick: NEGATIVE
Ketones, ur: NEGATIVE mg/dL
Nitrite: NEGATIVE
PH: 5 (ref 5.0–8.0)
Protein, ur: NEGATIVE mg/dL
Specific Gravity, Urine: >1.046 — ABNORMAL HIGH (ref 1.005–1.030)
Squamous Epithelial / HPF: NONE SEEN (ref 0–5)
WBC, UA: >50 WBC/hpf — ABNORMAL HIGH (ref 0–5)

## 2018-10-23 LAB — URINE DRUG SCREEN, QUALITATIVE (ARMC ONLY)
Amphetamines, Ur Screen: NOT DETECTED
Barbiturates, Ur Screen: NOT DETECTED
Benzodiazepine, Ur Scrn: NOT DETECTED
Cannabinoid 50 Ng, Ur ~~LOC~~: NOT DETECTED
Cocaine Metabolite,Ur ~~LOC~~: NOT DETECTED
MDMA (Ecstasy)Ur Screen: NOT DETECTED
Methadone Scn, Ur: NOT DETECTED
Opiate, Ur Screen: NOT DETECTED
Phencyclidine (PCP) Ur S: NOT DETECTED
Tricyclic, Ur Screen: NOT DETECTED

## 2018-10-23 LAB — GLUCOSE, CAPILLARY: Glucose-Capillary: 107 mg/dL — ABNORMAL HIGH (ref 70–99)

## 2018-10-23 LAB — PROTIME-INR
INR: 0.92
Prothrombin Time: 12.3 seconds (ref 11.4–15.2)

## 2018-10-23 LAB — LIPID PANEL
Cholesterol: 166 mg/dL (ref 0–200)
HDL: 44 mg/dL (ref >40)
LDL Cholesterol: 88 mg/dL (ref 0–99)
Total CHOL/HDL Ratio: 3.8 RATIO
Triglycerides: 172 mg/dL — ABNORMAL HIGH (ref <150)
VLDL: 34 mg/dL (ref 0–40)

## 2018-10-23 LAB — TSH: TSH: 2.975 u[IU]/mL (ref 0.350–4.500)

## 2018-10-23 LAB — HEMOGLOBIN A1C
Hgb A1c MFr Bld: 6.8 % — ABNORMAL HIGH (ref 4.8–5.6)
Mean Plasma Glucose: 148.46 mg/dL

## 2018-10-23 LAB — TROPONIN I: Troponin I: <0.03 ng/mL (ref <0.03)

## 2018-10-23 LAB — APTT: aPTT: 45 seconds — ABNORMAL HIGH (ref 24–36)

## 2018-10-23 MED ORDER — DOCUSATE SODIUM 100 MG PO CAPS
200.0000 mg | ORAL_CAPSULE | Freq: Two times a day (BID) | ORAL | Status: DC
  Administered 2018-10-23 – 2018-10-24 (×2): 200 mg via ORAL
  Filled 2018-10-23 (×2): qty 2

## 2018-10-23 MED ORDER — ENOXAPARIN SODIUM 40 MG/0.4ML ~~LOC~~ SOLN: 30.0000 mg | SUBCUTANEOUS | Status: DC

## 2018-10-23 MED ORDER — FE FUMARATE-B12-VIT C-FA-IFC PO CAPS
1.0000 | ORAL_CAPSULE | Freq: Every day | ORAL | Status: DC
  Administered 2018-10-24: 09:00:00 1 via ORAL
  Filled 2018-10-23: qty 1

## 2018-10-23 MED ORDER — SIMVASTATIN 10 MG PO TABS
20.0000 mg | ORAL_TABLET | Freq: Every day | ORAL | Status: DC
  Administered 2018-10-23: 22:00:00 20 mg via ORAL
  Filled 2018-10-23 (×2): qty 2

## 2018-10-23 MED ORDER — BRINZOLAMIDE 1 % OP SUSP
1.0000 [drp] | Freq: Two times a day (BID) | OPHTHALMIC | Status: DC
  Administered 2018-10-23 – 2018-10-24 (×2): 1 [drp] via OPHTHALMIC
  Filled 2018-10-23: qty 10

## 2018-10-23 MED ORDER — SODIUM CHLORIDE 0.9 % IV SOLN
INTRAVENOUS | Status: DC
  Administered 2018-10-23: 22:00:00 via INTRAVENOUS

## 2018-10-23 MED ORDER — INTEGRA F 125-1 MG PO CAPS: 1.0000 | ORAL_CAPSULE | Freq: Every day | ORAL | Status: DC

## 2018-10-23 MED ORDER — POLYVINYL ALCOHOL 1.4 % OP SOLN
1.0000 [drp] | Freq: Three times a day (TID) | OPHTHALMIC | Status: DC
  Administered 2018-10-23 – 2018-10-24 (×2): 1 [drp] via OPHTHALMIC
  Filled 2018-10-23: qty 15

## 2018-10-23 MED ORDER — POLYETHYLENE GLYCOL 3350 17 G PO PACK
17.0000 g | PACK | Freq: Every day | ORAL | Status: DC
  Administered 2018-10-23 – 2018-10-24 (×2): 17 g via ORAL
  Filled 2018-10-23: qty 1

## 2018-10-23 MED ORDER — ASPIRIN 81 MG PO CHEW
324.0000 mg | CHEWABLE_TABLET | Freq: Once | ORAL | Status: AC
  Administered 2018-10-23: 324 mg via ORAL
  Filled 2018-10-23: qty 4

## 2018-10-23 MED ORDER — TAMSULOSIN HCL 0.4 MG PO CAPS
0.4000 mg | ORAL_CAPSULE | Freq: Every day | ORAL | Status: DC
  Administered 2018-10-24: 0.4 mg via ORAL
  Filled 2018-10-23: qty 1

## 2018-10-23 MED ORDER — CLOPIDOGREL BISULFATE 75 MG PO TABS
75.0000 mg | ORAL_TABLET | Freq: Every day | ORAL | Status: DC
  Administered 2018-10-23 – 2018-10-24 (×2): 75 mg via ORAL
  Filled 2018-10-23 (×2): qty 1

## 2018-10-23 MED ORDER — IOHEXOL 350 MG/ML SOLN
75.0000 mL | Freq: Once | INTRAVENOUS | Status: AC | PRN
  Administered 2018-10-23: 60 mL via INTRAVENOUS

## 2018-10-23 MED ORDER — ENOXAPARIN SODIUM 40 MG/0.4ML ~~LOC~~ SOLN: 40.0000 mg | SUBCUTANEOUS | Status: DC

## 2018-10-23 MED ORDER — ACETAMINOPHEN 650 MG RE SUPP: 650.0000 mg | Freq: Four times a day (QID) | RECTAL | Status: DC | PRN

## 2018-10-23 MED ORDER — LATANOPROST 0.005 % OP SOLN
1.0000 [drp] | Freq: Every day | OPHTHALMIC | Status: DC
  Administered 2018-10-23: 1 [drp] via OPHTHALMIC
  Filled 2018-10-23: qty 2.5

## 2018-10-23 MED ORDER — ONDANSETRON HCL 4 MG PO TABS: 4.0000 mg | ORAL_TABLET | Freq: Four times a day (QID) | ORAL | Status: DC | PRN

## 2018-10-23 MED ORDER — ONDANSETRON HCL 4 MG/2ML IJ SOLN: 4.0000 mg | Freq: Four times a day (QID) | INTRAMUSCULAR | Status: DC | PRN

## 2018-10-23 MED ORDER — PAROXETINE HCL 20 MG PO TABS
20.0000 mg | ORAL_TABLET | Freq: Every day | ORAL | Status: DC
  Administered 2018-10-24: 09:00:00 20 mg via ORAL
  Filled 2018-10-23: qty 1

## 2018-10-23 MED ORDER — METOPROLOL SUCCINATE ER 50 MG PO TB24
50.0000 mg | ORAL_TABLET | Freq: Every day | ORAL | Status: DC
  Administered 2018-10-24: 09:00:00 50 mg via ORAL
  Filled 2018-10-23: qty 1

## 2018-10-23 MED ORDER — AMLODIPINE BESYLATE 5 MG PO TABS
5.0000 mg | ORAL_TABLET | Freq: Every day | ORAL | Status: DC
  Administered 2018-10-24: 5 mg via ORAL
  Filled 2018-10-23: qty 1

## 2018-10-23 MED ORDER — ACETAMINOPHEN 325 MG PO TABS: 650.0000 mg | ORAL_TABLET | Freq: Four times a day (QID) | ORAL | Status: DC | PRN

## 2018-10-23 MED ORDER — GABAPENTIN 100 MG PO CAPS
100.0000 mg | ORAL_CAPSULE | Freq: Every day | ORAL | Status: DC
  Administered 2018-10-23: 22:00:00 100 mg via ORAL
  Filled 2018-10-23: qty 1

## 2018-10-23 MED ORDER — PANTOPRAZOLE SODIUM 40 MG PO TBEC
40.0000 mg | DELAYED_RELEASE_TABLET | Freq: Every day | ORAL | Status: DC
  Administered 2018-10-24: 09:00:00 40 mg via ORAL
  Filled 2018-10-23: qty 1

## 2018-10-23 NOTE — Progress Notes (Signed)
PHARMACIST - PHYSICIAN COMMUNICATION  CONCERNING:  Enoxaparin (Lovenox) for DVT Prophylaxis    RECOMMENDATION: Patient was prescribed enoxaprin 40mg  q24 hours for VTE prophylaxis.   Filed Weights   10/23/18 1505  Weight: 129 lb 9.6 oz (58.8 kg)    Body mass index is 23.7 kg/m.  Estimated Creatinine Clearance: 25.6 mL/min (A) (by C-G formula based on SCr of 1.25 mg/dL (H)).  Patient is candidate for enoxaparin 30mg  every 24 hours based on CrCl <37ml/min or Weight less then 45kg for female and 50kg for female  DESCRIPTION: Pharmacy has adjusted enoxaparin dose per [facility] policy.   Patient is now receiving enoxaparin 30mg  every 24 hours.  Orinda Kenner, PharmD Clinical Pharmacist  10/23/2018 7:52 PM

## 2018-10-23 NOTE — ED Notes (Signed)
Patient transported to CT 

## 2018-10-23 NOTE — ED Notes (Signed)
Patient returned from CT

## 2018-10-23 NOTE — ED Triage Notes (Signed)
Pt presents to ED via [facility] from [facility] c/o LUE and LLE weakness with L-sided facial droop. EMS report [facility] 10/19/18. Pt has hx CVAs and dementia, states weakness is worse than usual but is unsure if she had residual symptoms from previous strokes.

## 2018-10-23 NOTE — ED Notes (Signed)
ED TO INPATIENT HANDOFF REPORT  ED Nurse Name and Phone #: Cala Bradford RN [phone]  Name/Age/Gender Jessica Gardner 85 y.o. female Room/Bed: ED06A/ED06A  Code Status   Code Status: Prior  Home/SNF/Other Nursing Home Patient oriented to: self, place and time Is this baseline? Yes   Triage Complete: Triage complete   Chief Complaint L sided weakness   Triage Note Pt presents to ED via [facility] from [facility] c/o LUE and LLE weakness with L-sided facial droop. EMS report [facility] 10/19/18. Pt has hx CVAs and dementia, states weakness is worse than usual but is unsure if she had residual symptoms from previous strokes.    Allergies No Known Allergies  Level of Care/Admitting Diagnosis ED Disposition    ED Disposition Condition Comment   Admit  Hospital Area: [facility] [[id number]]  Level of Care: Med-Surg [16]  Diagnosis: Stroke (cerebrum) [facility]) [[id number]]  Admitting Physician: Enid Baas [[id number]]  Attending Physician: Enid Baas [[id number]]  Estimated length of stay: past midnight tomorrow  Certification:: I certify this patient will need inpatient services for at least 2 midnights  PT Class (Do Not Modify): Inpatient [101]  PT Acc Code (Do Not Modify): Private [1]       Medical/Surgery History Past Medical History:  Diagnosis Date  . Dementia (HCC)   . Diabetes mellitus   . MI (myocardial infarction) (HCC)   . Pacemaker   . Stroke [facility])    Past Surgical History:  Procedure Laterality Date  . ABDOMINAL HYSTERECTOMY    . PACEMAKER INSERTION    . right ear surgery    . TUBAL LIGATION       IV Location/Drains/Wounds Patient Lines/Drains/Airways Status   Active Line/Drains/Airways    Name:   Placement date:   Placement time:   Site:   Days:   Peripheral IV 10/23/18 Right Antecubital   10/23/18    1511    Antecubital   less than 1   Urethral Catheter Wynona Dove, RN 14 Fr.   08/03/18    1349    -   81           Intake/Output Last 24 hours No intake or output data in the 24 hours ending 10/23/18 1930  Labs/Imaging Results for orders placed or performed during the hospital encounter of 10/23/18 (from the past 48 hour(s))  Ethanol     Status: None   Collection Time: 10/23/18  3:05 PM  Result Value Ref Range   Alcohol, Ethyl (B) <10 <10 mg/dL    Comment: (NOTE) Lowest detectable limit for serum alcohol is 10 mg/dL. For medical purposes only. Performed at [facility], [address] [place], [place] [address]   Protime-INR     Status: None   Collection Time: 10/23/18  3:05 PM  Result Value Ref Range   Prothrombin Time 12.3 11.4 - 15.2 seconds   INR 0.92     Comment: Performed at [facility], [address] [place], [place] [address]  APTT     Status: Abnormal   Collection Time: 10/23/18  3:05 PM  Result Value Ref Range   aPTT 45 (H) 24 - 36 seconds    Comment:        IF BASELINE aPTT IS ELEVATED, SUGGEST PATIENT RISK ASSESSMENT BE USED TO DETERMINE APPROPRIATE ANTICOAGULANT THERAPY. Performed at [facility], [address], [place], [place] [address]   CBC     Status: Abnormal   Collection Time: 10/23/18  3:05 PM  Result Value Ref Range   WBC 7.2 4.0 - 10.5 K/uL   RBC 3.37 (L) 3.87 - 5.11 MIL/uL   Hemoglobin 10.3 (L) 12.0 - 15.0 g/dL   HCT 69.632.2 (L) 29.536.0 - 28.446.0 %   MCV 95.5 80.0 - 100.0 fL   MCH 30.6 26.0 - 34.0 pg   MCHC 32.0 30.0 - 36.0 g/dL   RDW 13.213.3 44.011.5 - 10.215.5 %   Platelets 397 150 - 400 K/uL   nRBC 0.0 0.0 - 0.2 %    Comment: Performed at [facility], [address] [place], [place] [address]  Differential     Status: None   Collection Time: 10/23/18  3:05 PM  Result Value Ref Range   Neutrophils Relative % 40 %   Neutro Abs 2.8 1.7 - 7.7 K/uL   Lymphocytes Relative 43 %   Lymphs Abs 3.1 0.7 - 4.0 K/uL   Monocytes Relative 10 %   Monocytes Absolute 0.7 0.1 - 1.0 K/uL   Eosinophils Relative 6 %   Eosinophils  Absolute 0.4 0.0 - 0.5 K/uL   Basophils Relative 1 %   Basophils Absolute 0.1 0.0 - 0.1 K/uL   Immature Granulocytes 0 %   Abs Immature Granulocytes 0.01 0.00 - 0.07 K/uL    Comment: Performed at [facility], [address] [place], [place] [address]  Comprehensive metabolic panel     Status: Abnormal   Collection Time: 10/23/18  3:05 PM  Result Value Ref Range   Sodium 142 135 - 145 mmol/L   Potassium 5.0 3.5 - 5.1 mmol/L   Chloride 106 98 - 111 mmol/L   CO2 25 22 - 32 mmol/L   Glucose, Bld 129 (H) 70 - 99 mg/dL   BUN 20 8 - 23 mg/dL   Creatinine, Ser 4.741.25 (H) 0.44 - 1.00 mg/dL   Calcium 9.4 8.9 - 25.910.3 mg/dL   Total Protein 7.5 6.5 - 8.1 g/dL   Albumin 3.7 3.5 - 5.0 g/dL   AST 24 15 - 41 U/L   ALT 19 0 - 44 U/L   Alkaline Phosphatase 56 38 - 126 U/L   Total Bilirubin 0.7 0.3 - 1.2 mg/dL   GFR calc non Af Amer 39 (L) >60 mL/min   GFR calc Af Amer 45 (L) >60 mL/min   Anion gap 11 5 - 15    Comment: Performed at [facility], [address] [place], [place] [address]  Troponin I - ONCE - STAT     Status: None   Collection Time: 10/23/18  3:05 PM  Result Value Ref Range   Troponin I <0.03 <0.03 ng/mL    Comment: Performed at [facility], [address] [place], [place] [address]  Glucose, capillary     Status: Abnormal   Collection Time: 10/23/18  3:06 PM  Result Value Ref Range   Glucose-Capillary 107 (H) 70 - 99 mg/dL   Ct Angio Head W Or Wo Contrast  Result Date: 10/23/2018 CLINICAL DATA:  Follow-up examination for acute stroke. EXAM: CT ANGIOGRAPHY HEAD AND NECK TECHNIQUE: Multidetector CT imaging of the head and neck was performed using the standard protocol during bolus administration of intravenous contrast. Multiplanar CT image reconstructions and MIPs were obtained to evaluate the vascular anatomy. Carotid stenosis measurements (when applicable) are obtained utilizing NASCET criteria, using the distal internal carotid diameter as the  denominator. CONTRAST:  60mL OMNIPAQUE IOHEXOL 350 MG/ML SOLN COMPARISON:  Prior head CT from earlier the  same day. FINDINGS: CTA NECK FINDINGS Aortic arch: Visualized aortic arch of normal caliber with normal 3 vessel morphology. Moderate atheromatous plaque at the origin of the left subclavian artery without significant stenosis. No high-grade stenosis about the origin of the great vessels. Visualized subclavian arteries widely patent. Right carotid system: Right CCA patent from its origin to the bifurcation without stenosis. Calcified plaque at the right bifurcation/proximal right ICA with associated stenosis of up to approximately 40% by NASCET criteria. Right ICA otherwise widely patent to the skull base without stenosis, dissection, or occlusion. Left carotid system: Left CCA patent from its origin to the bifurcation without stenosis. Calcified plaque at the left bifurcation with associated stenosis of up to approximately 50% by NASCET criteria. Left ICA otherwise widely patent distally to the skull base without stenosis, dissection, or occlusion. Vertebral arteries: Both of the vertebral arteries arise from the subclavian arteries. Right vertebral artery dominant and widely patent to the vertebrobasilar junction. Left vertebral artery diffusely hypoplastic, and essentially occludes at the V3 segment, with no significant flow into the cranial vault. Skeleton: No acute osseous finding. No discrete lytic or blastic osseous lesions. Other neck: No other acute soft tissue abnormality within the neck. Left maxillary sinus retention cyst noted. Upper chest: Visualized upper chest demonstrates no acute finding. Partially visualized lungs are clear. Left-sided pacemaker/AICD noted. Review of the MIP images confirms the above findings CTA HEAD FINDINGS Anterior circulation: Calcified plaque within the petrous segments bilaterally with resultant mild multifocal narrowing. Prominent carotid siphon atherosclerosis with  resultant moderate stenoses bilaterally (up to approximately 50-70% bilaterally, slightly worse on the left). ICA termini patent. A1 segments patent bilaterally. Normal anterior communicating artery. Anterior cerebral arteries demonstrate scattered atheromatous irregularity but are patent to their distal aspects without significant stenosis. Short-segment moderate stenosis at the origin of the right M1 segment. Right M1 other wise irregular but patent to its distal aspect. Normal right MCA bifurcation. Distal right MCA branches well perfused. Short-segment moderate stenosis at the origin of the left M1 (series 6, image 207). Left M1 widely patent distally. Distal left MCA branches well perfused. Distal small vessel atheromatous irregularity noted throughout the MCA branches bilaterally. Posterior circulation: Scattered atheromatous plaque within the proximal right V4 segment with up to moderate approximate 30-50% stenosis. Left vertebral occluded at the skull base and not seen. Posterior inferior cerebral arteries not visualized bilaterally. Tortuous with short-segment moderate tandem stenoses involving its proximal and mid aspect. Basilar patent distally to the basilar tip. Superior cerebral arteries patent bilaterally. Left PCA primarily supplied via the basilar. Right PCA supplied via a hypoplastic right P1 and prominent right posterior communicating artery. Venous sinuses: Patent. Anatomic variants: None significant. Delayed phase: No abnormal enhancement. Review of the MIP images confirms the above findings IMPRESSION: 1. Negative CTA for large vessel occlusion. 2. 40-50% atheromatous stenoses about the carotid bifurcations bilaterally, left worse than right. 3. Diffusely hypoplastic left vertebral artery, which occludes at its distal aspect prior to the skull base. Dominant right vertebral artery widely patent within the neck. 4. Advanced carotid siphon atherosclerosis with associated moderate multifocal  narrowing (approximately 50-70% bilaterally). 5. Short-segment moderate stenoses at the origin of the M1 segments bilaterally. 6. Moderate tandem proximal-mid basilar stenoses. Electronically Signed   By: Rise Mu M.D.   On: 10/23/2018 17:57   Ct Head Wo Contrast  Result Date: 10/23/2018 CLINICAL DATA:  85 year old female with left upper extremity and left lower extremity weakness with left-sided facial droop. History of infarcts and dementia. Initial  encounter. EXAM: CT HEAD WITHOUT CONTRAST TECHNIQUE: Contiguous axial images were obtained from the base of the skull through the vertex without intravenous contrast. COMPARISON:  07/31/2018 head CT. FINDINGS: Brain: Infarct posterior limb right internal capsule new from the prior examination of indeterminate age. Prominent chronic microvascular changes. Global atrophy. No intracranial mass lesion noted on this unenhanced exam. Vascular: Prominent vascular calcifications. No definitive evidence of acute hyperdense vessel. Skull: No acute abnormality. Sinuses/Orbits: No acute orbital abnormality. Visualized paranasal sinuses clear. Other: Mastoid air cells and middle ear cavities are clear. IMPRESSION: 1. Infarct posterior limb right internal capsule new from the prior examination of indeterminate age. 2. Prominent chronic microvascular changes. 3. Global atrophy. 4. Prominent vascular calcifications. Electronically Signed   By: Lacy Duverney M.D.   On: 10/23/2018 15:34   Ct Angio Neck W Or Wo Contrast  Result Date: 10/23/2018 CLINICAL DATA:  Follow-up examination for acute stroke. EXAM: CT ANGIOGRAPHY HEAD AND NECK TECHNIQUE: Multidetector CT imaging of the head and neck was performed using the standard protocol during bolus administration of intravenous contrast. Multiplanar CT image reconstructions and MIPs were obtained to evaluate the vascular anatomy. Carotid stenosis measurements (when applicable) are obtained utilizing NASCET criteria, using  the distal internal carotid diameter as the denominator. CONTRAST:  60mL OMNIPAQUE IOHEXOL 350 MG/ML SOLN COMPARISON:  Prior head CT from earlier the same day. FINDINGS: CTA NECK FINDINGS Aortic arch: Visualized aortic arch of normal caliber with normal 3 vessel morphology. Moderate atheromatous plaque at the origin of the left subclavian artery without significant stenosis. No high-grade stenosis about the origin of the great vessels. Visualized subclavian arteries widely patent. Right carotid system: Right CCA patent from its origin to the bifurcation without stenosis. Calcified plaque at the right bifurcation/proximal right ICA with associated stenosis of up to approximately 40% by NASCET criteria. Right ICA otherwise widely patent to the skull base without stenosis, dissection, or occlusion. Left carotid system: Left CCA patent from its origin to the bifurcation without stenosis. Calcified plaque at the left bifurcation with associated stenosis of up to approximately 50% by NASCET criteria. Left ICA otherwise widely patent distally to the skull base without stenosis, dissection, or occlusion. Vertebral arteries: Both of the vertebral arteries arise from the subclavian arteries. Right vertebral artery dominant and widely patent to the vertebrobasilar junction. Left vertebral artery diffusely hypoplastic, and essentially occludes at the V3 segment, with no significant flow into the cranial vault. Skeleton: No acute osseous finding. No discrete lytic or blastic osseous lesions. Other neck: No other acute soft tissue abnormality within the neck. Left maxillary sinus retention cyst noted. Upper chest: Visualized upper chest demonstrates no acute finding. Partially visualized lungs are clear. Left-sided pacemaker/AICD noted. Review of the MIP images confirms the above findings CTA HEAD FINDINGS Anterior circulation: Calcified plaque within the petrous segments bilaterally with resultant mild multifocal narrowing.  Prominent carotid siphon atherosclerosis with resultant moderate stenoses bilaterally (up to approximately 50-70% bilaterally, slightly worse on the left). ICA termini patent. A1 segments patent bilaterally. Normal anterior communicating artery. Anterior cerebral arteries demonstrate scattered atheromatous irregularity but are patent to their distal aspects without significant stenosis. Short-segment moderate stenosis at the origin of the right M1 segment. Right M1 other wise irregular but patent to its distal aspect. Normal right MCA bifurcation. Distal right MCA branches well perfused. Short-segment moderate stenosis at the origin of the left M1 (series 6, image 207). Left M1 widely patent distally. Distal left MCA branches well perfused. Distal small vessel atheromatous irregularity noted  throughout the MCA branches bilaterally. Posterior circulation: Scattered atheromatous plaque within the proximal right V4 segment with up to moderate approximate 30-50% stenosis. Left vertebral occluded at the skull base and not seen. Posterior inferior cerebral arteries not visualized bilaterally. Tortuous with short-segment moderate tandem stenoses involving its proximal and mid aspect. Basilar patent distally to the basilar tip. Superior cerebral arteries patent bilaterally. Left PCA primarily supplied via the basilar. Right PCA supplied via a hypoplastic right P1 and prominent right posterior communicating artery. Venous sinuses: Patent. Anatomic variants: None significant. Delayed phase: No abnormal enhancement. Review of the MIP images confirms the above findings IMPRESSION: 1. Negative CTA for large vessel occlusion. 2. 40-50% atheromatous stenoses about the carotid bifurcations bilaterally, left worse than right. 3. Diffusely hypoplastic left vertebral artery, which occludes at its distal aspect prior to the skull base. Dominant right vertebral artery widely patent within the neck. 4. Advanced carotid siphon  atherosclerosis with associated moderate multifocal narrowing (approximately 50-70% bilaterally). 5. Short-segment moderate stenoses at the origin of the M1 segments bilaterally. 6. Moderate tandem proximal-mid basilar stenoses. Electronically Signed   By: Rise MuBenjamin  McClintock M.D.   On: 10/23/2018 17:57    Pending Labs Unresulted Labs (From admission, onward)    Start     Ordered   10/23/18 1505  Urine Drug Screen, Qualitative  Once,   STAT     10/23/18 1505   10/23/18 1505  Urinalysis, Routine w reflex microscopic  ONCE - STAT,   STAT     10/23/18 1505   Signed and Held  Basic metabolic panel  Tomorrow morning,   R     Signed and Held   Signed and Held  CBC  Tomorrow morning,   R     Signed and Held   Signed and Held  TSH  Once,   R     Signed and Held   Signed and Held  Hemoglobin A1c  Once,   R     Signed and Held   Signed and Held  Lipid panel  Add-on,   R     Signed and Held          Vitals/Pain Today's Vitals   10/23/18 1505 10/23/18 1630 10/23/18 1924 10/23/18 1925  BP:  (!) 111/57 (!) 106/53   Pulse:  80  85  Resp:   20 15  Temp:      TempSrc:      SpO2:  96%  97%  Weight: 58.8 kg     Height: 5\' 2"  (1.575 m)       Isolation Precautions No active isolations  Medications Medications  aspirin chewable tablet 324 mg (324 mg Oral Given 10/23/18 1729)  iohexol (OMNIPAQUE) 350 MG/ML injection 75 mL (60 mLs Intravenous Contrast Given 10/23/18 1651)    Mobility manual wheelchair Low fall risk   Focused Assessments Neuro Assessment Handoff:  Swallow screen pass? Yes          Neuro Assessment:   Neuro Checks:      Last Documented NIHSS Modified Score:   Has TPA been given? No If patient is a Neuro Trauma and patient is going to OR before floor call report to 4N Charge nurse: [phone] or [phone]     Recommendations: See Admitting Provider Note  Report given to:   Additional Notes:

## 2018-10-23 NOTE — ED Notes (Signed)
Patient in bed with no complaints at this time. Will continue to monitor.

## 2018-10-23 NOTE — ED Provider Notes (Signed)
[facility] Emergency Department Provider Note  ____________________________________________  Time seen: Approximately 3:28 PM  I have reviewed the triage vital signs and the nursing notes.   HISTORY  Chief Complaint Cerebrovascular Accident  Level 5 caveat:  Portions of the history and physical were unable to be obtained due to dementia   HPI Jessica Gardner is a 85 y.o. female with a history of CVA with left-sided weakness, diabetes, CAD, dementia who presents from her nursing home for 4 days of worsening left-sided weakness.  History is very limited due to patient's history of dementia.  According to notes from recent admission and EMS report patient has some left-sided weakness from a old stroke.  However for the last 4 days she has been unable to move the left side and has a new left facial droop.  Patient denies chest pain or shortness of breath or headache.  Past Medical History:  Diagnosis Date  . Dementia (HCC)   . Diabetes mellitus   . MI (myocardial infarction) (HCC)   . Pacemaker   . Stroke [facility])     Patient Active Problem List   Diagnosis Date Noted  . CAD (coronary artery disease) 07/31/2018  . Vertebral artery dissection (HCC) 07/31/2018  . Presyncope 01/02/2018  . UTI (urinary tract infection) 01/02/2018  . Dehydration 01/02/2018  . HTN (hypertension) 01/02/2018  . Hyperlipidemia 01/02/2018  . Postural dizziness with presyncope 01/02/2018  . Hypokalemia 01/02/2018  . Left leg weakness 09/09/2017  . Diabetes (HCC) 09/09/2017  . Dementia (HCC) 09/09/2017  . History of stroke 09/09/2017  . Acute bronchitis 09/18/2016  . Elevated troponin 09/18/2016  . Anemia 09/18/2016  . Renal insufficiency 09/18/2016  . Generalized weakness 09/18/2016  . Chest pain 09/17/2016    Past Surgical History:  Procedure Laterality Date  . PACEMAKER INSERTION      Prior to Admission medications   Medication Sig Start Date End Date Taking?  Authorizing Provider  acetaminophen (TYLENOL) 325 MG tablet Take 2 tablets (650 mg total) by mouth every 4 (four) hours as needed for mild pain (or temp > 37.5 C (99.5 F)). 09/12/17  Yes Wieting, Richard, MD  amLODipine (NORVASC) 5 MG tablet Take 5 mg by mouth daily. 09/26/17  Yes [provider]  brinzolamide (AZOPT) 1 % ophthalmic suspension Place 1 drop into both eyes 2 (two) times daily.   Yes [provider]  dipyridamole-aspirin (AGGRENOX) 200-25 MG 12hr capsule Take 1 capsule by mouth 2 (two) times daily. 09/26/17  Yes [provider]  docusate sodium (COLACE) 100 MG capsule Take 2 capsules (200 mg total) by mouth 2 (two) times daily. 08/04/18  Yes Auburn Bilberry, MD  Fe Fum-FePoly-FA-Vit C-Vit B3 (INTEGRA F) 125-1 MG CAPS Take 1 capsule by mouth daily. 10/24/17  Yes [provider]  gabapentin (NEURONTIN) 100 MG capsule Take 100 mg by mouth at bedtime.   Yes [provider]  Ipratropium-Albuterol (COMBIVENT RESPIMAT) 20-100 MCG/ACT AERS respimat Inhale 1 puff into the lungs every 6 (six) hours. 09/18/16  Yes Katharina Caper, MD  latanoprost (XALATAN) 0.005 % ophthalmic solution Place 1 drop into both eyes at bedtime.   Yes [provider]  lisinopril (PRINIVIL,ZESTRIL) 20 MG tablet Take 20 mg by mouth daily. 09/26/17  Yes [provider]  metFORMIN (GLUCOPHAGE) 500 MG tablet Take 500 mg by mouth 2 (two) times daily.   Yes [provider]  metoprolol succinate (TOPROL-XL) 50 MG 24 hr tablet Take 50 mg by mouth daily.  Yes [provider]  omeprazole (PRILOSEC) 10 MG capsule Take 10 mg by mouth daily.    Yes [provider]  PARoxetine (PAXIL) 20 MG tablet Take 20 mg by mouth daily.   Yes [provider]  polyethylene glycol (MIRALAX / GLYCOLAX) packet Take 17 g by mouth daily. 08/04/18  Yes Auburn BilberryPatel, Shreyang, MD  Propylene Glycol (SYSTANE BALANCE) 0.6 % SOLN Place 1 drop into both eyes 3 (three) times  daily.   Yes [provider]  simvastatin (ZOCOR) 20 MG tablet Take 20 mg by mouth at bedtime.    Yes [provider]  tamsulosin (FLOMAX) 0.4 MG CAPS capsule Take 1 capsule (0.4 mg total) by mouth daily. 08/03/18  Yes Auburn BilberryPatel, Shreyang, MD  Vitamin D, Ergocalciferol, (DRISDOL) 50000 units CAPS capsule Take 50,000 Units by mouth every Sunday.    Yes [provider]    Allergies Patient has no known allergies.  Family History  Problem Relation Age of Onset  . Breast cancer Sister [id number]    Social History Social History   Tobacco Use  . Smoking status: Never Smoker  . Smokeless tobacco: Never Used  Substance Use Topics  . Alcohol use: No  . Drug use: No    Review of Systems  Constitutional: Negative for fever. Eyes: Negative for visual changes. ENT: Negative for sore throat. Neck: No neck pain  Cardiovascular: Negative for chest pain. Respiratory: Negative for shortness of breath. Gastrointestinal: Negative for abdominal pain, vomiting or diarrhea. Genitourinary: Negative for dysuria. Musculoskeletal: Negative for back pain. Skin: Negative for rash. Neurological: Negative for headaches. + L sided weakness Psych: No SI or HI  ____________________________________________   PHYSICAL EXAM:  VITAL SIGNS: ED Triage Vitals  Enc Vitals Group     BP 10/23/18 1503 (!) 112/58     Pulse Rate 10/23/18 1503 91     Resp 10/23/18 1503 13     Temp 10/23/18 1503 98.8 F (37.1 C)     Temp Source 10/23/18 1503 Oral     SpO2 10/23/18 1503 98 %     Weight 10/23/18 1505 129 lb 9.6 oz (58.8 kg)     Height 10/23/18 1505 5\' 2"  (1.575 m)     Head Circumference --      Peak Flow --      Pain Score --      Pain Loc --      Pain Edu? --      Excl. in GC? --     Constitutional: Alert and oriented x 2. Well appearing and in no apparent distress. HEENT:      Head: Normocephalic and atraumatic.         Eyes: Conjunctivae are normal. Sclera is non-icteric.        Mouth/Throat: Mucous membranes are moist.       Neck: Supple with no signs of meningismus. Cardiovascular: Regular rate and rhythm. No murmurs, gallops, or rubs. 2+ symmetrical distal pulses are present in all extremities. No JVD. Respiratory: Normal respiratory effort. Lungs are clear to auscultation bilaterally. No wheezes, crackles, or rhonchi.  Gastrointestinal: Soft, non tender, and non distended with positive bowel sounds. No rebound or guarding. Musculoskeletal: Nontender with normal range of motion in all extremities. No edema, cyanosis, or erythema of extremities. Neurologic: Normal speech and language. Mild left sided facial droop, 5/5 strength on the right and 0/5 on the LUE and 1/5 on the LLE skin: Skin is warm, dry and intact. No rash noted. Psychiatric: Mood and affect  are normal. Speech and behavior are normal.  ____________________________________________   LABS (all labs ordered are listed, but only abnormal results are displayed)  Labs Reviewed  APTT - Abnormal; Notable for the following components:      Result Value   aPTT 45 (*)    All other components within normal limits  CBC - Abnormal; Notable for the following components:   RBC 3.37 (*)    Hemoglobin 10.3 (*)    HCT 32.2 (*)    All other components within normal limits  COMPREHENSIVE METABOLIC PANEL - Abnormal; Notable for the following components:   Glucose, Bld 129 (*)    Creatinine, Ser 1.25 (*)    GFR calc non Af Amer 39 (*)    GFR calc Af Amer 45 (*)    All other components within normal limits  GLUCOSE, CAPILLARY - Abnormal; Notable for the following components:   Glucose-Capillary 107 (*)    All other components within normal limits  ETHANOL  PROTIME-INR  DIFFERENTIAL  TROPONIN I  URINE DRUG SCREEN, QUALITATIVE ([facility] ONLY)  URINALYSIS, ROUTINE W REFLEX MICROSCOPIC   ____________________________________________  EKG  ED ECG REPORT I, Nita Sickle, the attending physician, personally  viewed and interpreted this ECG.  Normal sinus rhythm with first-degree AV block, rate of 90, right bundle branch block, LAFB, LVH, Q waves in inferior leads with no ST elevations or depressions.  Unchanged from prior ____________________________________________  RADIOLOGY  I have personally reviewed the images performed during this visit and I agree with the Radiologist's read.   Interpretation by Radiologist:  Ct Head Wo Contrast  Result Date: 10/23/2018 CLINICAL DATA:  85 year old female with left upper extremity and left lower extremity weakness with left-sided facial droop. History of infarcts and dementia. Initial encounter. EXAM: CT HEAD WITHOUT CONTRAST TECHNIQUE: Contiguous axial images were obtained from the base of the skull through the vertex without intravenous contrast. COMPARISON:  07/31/2018 head CT. FINDINGS: Brain: Infarct posterior limb right internal capsule new from the prior examination of indeterminate age. Prominent chronic microvascular changes. Global atrophy. No intracranial mass lesion noted on this unenhanced exam. Vascular: Prominent vascular calcifications. No definitive evidence of acute hyperdense vessel. Skull: No acute abnormality. Sinuses/Orbits: No acute orbital abnormality. Visualized paranasal sinuses clear. Other: Mastoid air cells and middle ear cavities are clear. IMPRESSION: 1. Infarct posterior limb right internal capsule new from the prior examination of indeterminate age. 2. Prominent chronic microvascular changes. 3. Global atrophy. 4. Prominent vascular calcifications. Electronically Signed   By: Lacy Duverney M.D.   On: 10/23/2018 15:34      ____________________________________________   PROCEDURES  Procedure(s) performed: None Procedures Critical Care performed:  None ____________________________________________   INITIAL IMPRESSION / ASSESSMENT AND PLAN / ED COURSE  85 y.o. female with a history of CVA with left-sided weakness,  diabetes, CAD, dementia who presents from her nursing home for 4 days of worsening left-sided weakness.  Patient has significant weakness on the left side with a left-sided facial droop.  Outside of window of TPA, last known normal was 10/23/2018.  Per EMS this is new.  Review of epic shows that patient was admitted in November 2019 with a vertebral artery dissection.  She was discharged on Aggrenox.  Looking at her medication list that will see any blood thinners being given to her at this time.  Her CT head shows new infarct in the posterior limb of the right internal capsule.  Will give an aspirin and admit to the hospitalist for further evaluation.  As part of my medical decision making, I reviewed the following data within the electronic MEDICAL RECORD NUMBER Nursing notes reviewed and incorporated, Labs reviewed , EKG interpreted , Radiograph reviewed , Discussed with admitting physician , Notes from prior ED visits and [place] Controlled Substance Database    Pertinent labs & imaging results that were available during my care of the patient were reviewed by me and considered in my medical decision making (see chart for details).    ____________________________________________   FINAL CLINICAL IMPRESSION(S) / ED DIAGNOSES  Final diagnoses:  Cerebrovascular accident (CVA), unspecified mechanism (HCC)      NEW MEDICATIONS STARTED DURING THIS VISIT:  ED Discharge Orders    None       Note:  This document was prepared using Dragon voice recognition software and may include unintentional dictation errors.    Don PerkingVeronese, [place], MD 10/23/18 1630

## 2018-10-23 NOTE — H&P (Signed)
[facility] [place] [facility]   PATIENT NAME: Jessica Gardner    MR#:  [id number]  DATE OF BIRTH:  November 11, 1929  DATE OF ADMISSION:  10/23/2018  PRIMARY CARE PHYSICIAN: Barbette ReichmannHande, Vishwanath, MD   REQUESTING/REFERRING PHYSICIAN: Dr. Nita Sicklearolina Veronese  CHIEF COMPLAINT:   Chief Complaint  Patient presents with  . Cerebrovascular Accident    HISTORY OF PRESENT ILLNESS:  Jessica Gardner  is a 85 y.o. female with a known history of mild dementia, diabetes, prior history of strokes with minimal left-sided weakness, history of complete heart block status post pacemaker [facility] to worsening left-sided weakness today.  Patient states that she is nonambulatory at baseline due to prior strokes.  She has minimal left-sided weakness at baseline.  She was admitted to our hospital 3 months ago for vertebral artery dissection and altered mental status.  Symptoms resolved and she was at baseline and was discharged back.  Has a permanent pacemaker placed so cannot get an MRI done.  For the last 2 to 3 days, she has noticed worsening weakness of her left arm and has been needing assistance with feeding and other activities.  She states she is mostly wheelchair-bound at baseline.  Her speech is clear, but this morning she also had some difficulty swallowing and so was brought to the emergency room.  She is oriented x3 currently.  She is on Aggrenox at home.  Work-up in the emergency room does indicate that she has 2/5 left upper and lower extremity weakness.  Borderline hyperkalemia on the labs.  CT of the head showing right posterior limb internal capsule infarct which is new compared to prior.  CT Angie of head and neck is pending.  PAST MEDICAL HISTORY:   Past Medical History:  Diagnosis Date  . Dementia (HCC)   . Diabetes mellitus   . MI (myocardial infarction) (HCC)   . Pacemaker   . Stroke [facility](HCC)     PAST SURGICAL HISTORY:   Past Surgical  History:  Procedure Laterality Date  . ABDOMINAL HYSTERECTOMY    . PACEMAKER INSERTION    . right ear surgery    . TUBAL LIGATION      SOCIAL HISTORY:   Social History   Tobacco Use  . Smoking status: Never Smoker  . Smokeless tobacco: Never Used  Substance Use Topics  . Alcohol use: No    FAMILY HISTORY:   Family History  Problem Relation Age of Onset  . Breast cancer Sister [id number]    DRUG ALLERGIES:  No Known Allergies  REVIEW OF SYSTEMS:   Review of Systems  Constitutional: Positive for malaise/fatigue. Negative for chills, fever and weight loss.  HENT: Negative for ear discharge, ear pain, hearing loss, nosebleeds and tinnitus.   Eyes: Negative for blurred vision, double vision and photophobia.  Respiratory: Negative for cough, hemoptysis, shortness of breath and wheezing.   Cardiovascular: Negative for chest pain, palpitations, orthopnea and leg swelling.  Gastrointestinal: Negative for abdominal pain, constipation, diarrhea, heartburn, melena, nausea and vomiting.  Genitourinary: Negative for dysuria, frequency, hematuria and urgency.  Musculoskeletal: Negative for back pain, myalgias and neck pain.  Skin: Negative for rash.  Neurological: Positive for focal weakness. Negative for dizziness, tingling, tremors, sensory change, speech change and headaches.       Difficulty swallowing  Endo/Heme/Allergies: Does not bruise/bleed easily.  Psychiatric/Behavioral: Negative for depression.    MEDICATIONS AT HOME:   Prior to Admission medications   Medication Sig Start Date  End Date Taking? Authorizing Provider  acetaminophen (TYLENOL) 325 MG tablet Take 2 tablets (650 mg total) by mouth every 4 (four) hours as needed for mild pain (or temp > 37.5 C (99.5 F)). 09/12/17  Yes Wieting, Richard, MD  amLODipine (NORVASC) 5 MG tablet Take 5 mg by mouth daily. 09/26/17  Yes [provider]  brinzolamide (AZOPT) 1 % ophthalmic suspension Place 1 drop into both eyes 2  (two) times daily.   Yes [provider]  dipyridamole-aspirin (AGGRENOX) 200-25 MG 12hr capsule Take 1 capsule by mouth 2 (two) times daily. 09/26/17  Yes [provider]  docusate sodium (COLACE) 100 MG capsule Take 2 capsules (200 mg total) by mouth 2 (two) times daily. 08/04/18  Yes Auburn Bilberry, MD  Fe Fum-FePoly-FA-Vit C-Vit B3 (INTEGRA F) 125-1 MG CAPS Take 1 capsule by mouth daily. 10/24/17  Yes [provider]  gabapentin (NEURONTIN) 100 MG capsule Take 100 mg by mouth at bedtime.   Yes [provider]  Ipratropium-Albuterol (COMBIVENT RESPIMAT) 20-100 MCG/ACT AERS respimat Inhale 1 puff into the lungs every 6 (six) hours. 09/18/16  Yes Katharina Caper, MD  latanoprost (XALATAN) 0.005 % ophthalmic solution Place 1 drop into both eyes at bedtime.   Yes [provider]  lisinopril (PRINIVIL,ZESTRIL) 20 MG tablet Take 20 mg by mouth daily. 09/26/17  Yes [provider]  metFORMIN (GLUCOPHAGE) 500 MG tablet Take 500 mg by mouth 2 (two) times daily.   Yes [provider]  metoprolol succinate (TOPROL-XL) 50 MG 24 hr tablet Take 50 mg by mouth daily.    Yes [provider]  omeprazole (PRILOSEC) 10 MG capsule Take 10 mg by mouth daily.    Yes [provider]  PARoxetine (PAXIL) 20 MG tablet Take 20 mg by mouth daily.   Yes [provider]  polyethylene glycol (MIRALAX / GLYCOLAX) packet Take 17 g by mouth daily. 08/04/18  Yes Auburn Bilberry, MD  Propylene Glycol (SYSTANE BALANCE) 0.6 % SOLN Place 1 drop into both eyes 3 (three) times daily.   Yes [provider]  simvastatin (ZOCOR) 20 MG tablet Take 20 mg by mouth at bedtime.    Yes [provider]  tamsulosin (FLOMAX) 0.4 MG CAPS capsule Take 1 capsule (0.4 mg total) by mouth daily. 08/03/18  Yes Auburn Bilberry, MD  Vitamin D, Ergocalciferol, (DRISDOL) 50000 units CAPS capsule Take 50,000 Units by mouth every Sunday.    Yes [provider]      VITAL SIGNS:  Blood pressure (!) 112/58, pulse 91, temperature 98.8 F (37.1 C), temperature source Oral, resp. rate 13, height 5\' 2"  (1.575 m), weight 58.8 kg, SpO2 98 %.  PHYSICAL EXAMINATION:   Physical Exam  GENERAL:  85 y.o.-year-old elderly patient lying in the bed with no acute distress.  EYES: Pupils equal, round, reactive to light and accommodation. No scleral icterus. Extraocular muscles intact.  HEENT: Head atraumatic, normocephalic. Oropharynx and nasopharynx clear.  NECK:  Supple, no jugular venous distention. No thyroid enlargement, no tenderness.  LUNGS: Normal breath sounds bilaterally, no wheezing, rales,rhonchi or crepitation. No use of accessory muscles of respiration.  Decreased bibasilar breath sounds. CARDIOVASCULAR: S1, S2 normal. No  rubs, or gallops.  2/6 systolic murmur is present.  Pacemaker is present ABDOMEN: Soft, nontender, nondistended. Bowel sounds present. No organomegaly or mass.  EXTREMITIES: No pedal edema, cyanosis, or clubbing.  NEUROLOGIC: Cranial nerves II through XII are intact.  Minimal left facial droop noted.  Muscle strength 5/5 in  both right upper and lower extremities.  2/5 in left upper and lower extremities.. Sensation intact. Gait not checked.  PSYCHIATRIC: The patient is alert and oriented x 3.  SKIN: No obvious rash, lesion, or ulcer.   LABORATORY PANEL:   CBC Recent Labs  Lab 10/23/18 1505  WBC 7.2  HGB 10.3*  HCT 32.2*  PLT 397   ------------------------------------------------------------------------------------------------------------------  Chemistries  Recent Labs  Lab 10/23/18 1505  NA 142  K 5.0  CL 106  CO2 25  GLUCOSE 129*  BUN 20  CREATININE 1.25*  CALCIUM 9.4  AST 24  ALT 19  ALKPHOS 56  BILITOT 0.7   ------------------------------------------------------------------------------------------------------------------  Cardiac Enzymes Recent Labs  Lab 10/23/18 1505  TROPONINI  <0.03   ------------------------------------------------------------------------------------------------------------------  RADIOLOGY:  Ct Head Wo Contrast  Result Date: 10/23/2018 CLINICAL DATA:  85 year old female with left upper extremity and left lower extremity weakness with left-sided facial droop. History of infarcts and dementia. Initial encounter. EXAM: CT HEAD WITHOUT CONTRAST TECHNIQUE: Contiguous axial images were obtained from the base of the skull through the vertex without intravenous contrast. COMPARISON:  07/31/2018 head CT. FINDINGS: Brain: Infarct posterior limb right internal capsule new from the prior examination of indeterminate age. Prominent chronic microvascular changes. Global atrophy. No intracranial mass lesion noted on this unenhanced exam. Vascular: Prominent vascular calcifications. No definitive evidence of acute hyperdense vessel. Skull: No acute abnormality. Sinuses/Orbits: No acute orbital abnormality. Visualized paranasal sinuses clear. Other: Mastoid air cells and middle ear cavities are clear. IMPRESSION: 1. Infarct posterior limb right internal capsule new from the prior examination of indeterminate age. 2. Prominent chronic microvascular changes. 3. Global atrophy. 4. Prominent vascular calcifications. Electronically Signed   By: Lacy DuverneySteven  Olson M.D.   On: 10/23/2018 15:34    EKG:   Orders placed or performed during the hospital encounter of 10/23/18  . EKG 12-Lead  . EKG 12-Lead  . ED EKG  . ED EKG    IMPRESSION AND PLAN:   Jessica Gardner  is a 85 y.o. female with a known history of mild dementia, diabetes, prior history of strokes with minimal left-sided weakness, history of complete heart block status post pacemaker [facility] to worsening left-sided weakness today.  1. Acute CVA-  Presenting with worsening left sided symptoms, CT head with right internal capsule posterior limb infarct - cannot get MRI due to pacemaker -  admit, on aggrenox, statin- change aggrenox to plavix - get CTA of head and neck as h/o vertebral art dissection in Nov 2019 - neuro checks, neurology consult- notified neurology - PT/OT and speech therapy consults - during last adm- neurology recommended prolonged outpt cardiac monitoring for any afib- will need that this time after discharge - ECHO with bubble study ordered  2. Hyperkalemia- borderline elevated- hold lisinopril  3. HTN- restart norvasc and metoprolol as stroke symptoms started 3 days ago  4. DM- last a1c of 6, hold metformin as receiving contrast for CTA  5. Glaucoma- continue eye drops  6. DVT Prophylaxis- lovenox     All the records are reviewed and case discussed with ED provider. Management plans discussed with the patient, family and they are in agreement.  CODE STATUS: DNR  TOTAL TIME TAKING CARE OF THIS PATIENT: 51 minutes.    Enid Baasadhika Lashae Wollenberg M.D on 10/23/2018 at 4:59 PM  Between 7am to 6pm - Pager - [phone]  After 6pm go to [email or web address] - Social research officer, governmentpassword [email or web address]  [facility]  Office  [phone]  CC: Primary care physician; Tracie Harrier, MD

## 2018-10-23 NOTE — Progress Notes (Signed)
   [facility] - [place] at [facility]   Advance care planning  Hospital Day: 0 days Jessica Gardner is a 85 y.o. female presenting with Cerebrovascular Accident .   Advance care planning discussed with patient who has underlying dementia but seems to be oriented x3 at this time and is able to explain her symptoms well. Also discussed with her daughter Darlyn Kackley over the phone.   Current diagnosis-acute stroke.  Also past medical history significant for prior strokes, mild dementia, bedbound status, diabetes and hypertension.   All questions in regards to overall condition and expected prognosis answered.  Patient has clearly mentioned to continue current level of care but in case of cardiac arrest, she wishes for a natural death and does not want to be resuscitated.  Daughter agreed with patient's decision. The decision was made to change her current code status  CODE STATUS: DNR Time spent: 18 minutes

## 2018-10-23 NOTE — ED Notes (Signed)
EDP in with patient 

## 2018-10-23 NOTE — ED Notes (Signed)
Amitting physician in with patient.

## 2018-10-24 ENCOUNTER — Inpatient Hospital Stay
Admit: 2018-10-24 | Discharge: 2018-10-24 | Disposition: A | Discharge status: Home / Self Care | Payer: Medicare Other | Attending: Internal Medicine | Admitting: Internal Medicine

## 2018-10-24 DIAGNOSIS — I639 Cerebral infarction, unspecified: Principal | ICD-10-CM

## 2018-10-24 LAB — CBC
HCT: 30.3 % — ABNORMAL LOW (ref 36.0–46.0)
Hemoglobin: 9.9 g/dL — ABNORMAL LOW (ref 12.0–15.0)
MCH: 31.2 pg (ref 26.0–34.0)
MCHC: 32.7 g/dL (ref 30.0–36.0)
MCV: 95.6 fL (ref 80.0–100.0)
NRBC: 0 % (ref 0.0–0.2)
Platelets: 363 10*3/uL (ref 150–400)
RBC: 3.17 MIL/uL — ABNORMAL LOW (ref 3.87–5.11)
RDW: 13.1 % (ref 11.5–15.5)
WBC: 5.7 10*3/uL (ref 4.0–10.5)

## 2018-10-24 LAB — BASIC METABOLIC PANEL
Anion gap: 7 (ref 5–15)
BUN: 19 mg/dL (ref 8–23)
CO2: 26 mmol/L (ref 22–32)
Calcium: 9 mg/dL (ref 8.9–10.3)
Chloride: 108 mmol/L (ref 98–111)
Creatinine, Ser: 0.99 mg/dL (ref 0.44–1.00)
GFR calc non Af Amer: 52 mL/min — ABNORMAL LOW (ref >60)
GFR, EST AFRICAN AMERICAN: 60 mL/min — AB (ref >60)
Glucose, Bld: 105 mg/dL — ABNORMAL HIGH (ref 70–99)
Potassium: 3.6 mmol/L (ref 3.5–5.1)
Sodium: 141 mmol/L (ref 135–145)

## 2018-10-24 LAB — MRSA PCR SCREENING: MRSA by PCR: NEGATIVE

## 2018-10-24 LAB — GLUCOSE, CAPILLARY
Glucose-Capillary: 100 mg/dL — ABNORMAL HIGH (ref 70–99)
Glucose-Capillary: 169 mg/dL — ABNORMAL HIGH (ref 70–99)

## 2018-10-24 MED ORDER — INSULIN ASPART 100 UNIT/ML ~~LOC~~ SOLN
0.0000 [IU] | Freq: Three times a day (TID) | SUBCUTANEOUS | Status: DC
  Administered 2018-10-24: 13:00:00 2 [IU] via SUBCUTANEOUS
  Filled 2018-10-24: qty 1

## 2018-10-24 MED ORDER — SODIUM CHLORIDE 0.9 % IV SOLN
1.0000 g | INTRAVENOUS | Status: DC
  Administered 2018-10-24: 1 g via INTRAVENOUS
  Filled 2018-10-24: qty 1
  Filled 2018-10-24: qty 10

## 2018-10-24 MED ORDER — ASPIRIN 325 MG PO TABS: 325.0000 mg | ORAL_TABLET | Freq: Every day | ORAL | 0 refills | Status: AC

## 2018-10-24 MED ORDER — CEPHALEXIN 250 MG PO CAPS: 250.0000 mg | ORAL_CAPSULE | Freq: Two times a day (BID) | ORAL | 0 refills | Status: AC

## 2018-10-24 MED ORDER — CLOPIDOGREL BISULFATE 75 MG PO TABS: 75.0000 mg | ORAL_TABLET | Freq: Every day | ORAL | 0 refills | Status: DC

## 2018-10-24 MED ORDER — ASPIRIN 325 MG PO TABS
325.0000 mg | ORAL_TABLET | Freq: Every day | ORAL | Status: DC
  Administered 2018-10-24: 13:00:00 325 mg via ORAL
  Filled 2018-10-24: qty 1

## 2018-10-24 NOTE — Evaluation (Addendum)
Clinical/Bedside Swallow Evaluation Patient Details  Name: Jessica Gardner MRN: [id number] Date of Birth: [birth year]  Today's Date: 10/24/2018 Time: SLP Start Time (ACUTE ONLY): 0900 SLP Stop Time (ACUTE ONLY): 0945 SLP Time Calculation (min) (ACUTE ONLY): 45 min  Past Medical History:  Past Medical History:  Diagnosis Date  . Dementia (HCC)   . Diabetes mellitus   . MI (myocardial infarction) (HCC)   . Pacemaker   . Stroke [facility](HCC)    Past Surgical History:  Past Surgical History:  Procedure Laterality Date  . ABDOMINAL HYSTERECTOMY    . PACEMAKER INSERTION    . right ear surgery    . TUBAL LIGATION     HPI:  Per admitting H&P, pt is a 85 y.o. female with a known history of mild dementia, diabetes, prior history of strokes with minimal left-sided weakness, history of complete heart block status post pacemaker [facility] secondary to worsening left-sided weakness today. Patient states that she is nonambulatory at baseline due to prior strokes.  She has minimal left-sided weakness at baseline.  She was admitted to our hospital 3 months ago for vertebral artery dissection and altered mental status.  Symptoms resolved and she was at baseline and was discharged back.  Has a permanent pacemaker placed so cannot get an MRI done.  For the last 2 to 3 days, she has noticed worsening weakness of her left arm and has been needing assistance with feeding and other activities.  She states she is mostly wheelchair-bound at baseline.  Her speech is clear, but this morning she also had some difficulty swallowing and so was brought to the emergency room.  She is oriented x3 currently.  She is on Aggrenox at home. Work-up in the emergency room does indicate that she has 2/5 left upper and lower extremity weakness.  Borderline hyperkalemia on the labs.  CT of the head showing right posterior limb internal capsule infarct which is new compared to prior.  CT Angie of head and neck is pending.    Assessment / Plan / Recommendation Clinical Impression  This pleasant and cooperative pt appears to present w/ adequate oropharyngeal swallowing function w/ reduced risk for aspiration when following general aspiration precautions. Pt was just waking up upon NSG and ST entry - NSG to give pt her morning medication and ST to assess swallowing function w/ breakfast tray. NSG and ST positioned pt upright w/ head forward, following general aspiration precautions. Pt did not appear to have any difficulty swallowing medication ONE AT A TIME w/ thin liquids presented by NSG via Cup rim - pt did not exhibit any immediate overt s/s of aspiration (throat clearing, coughing, choking, changes in respiration). Pt did best when holding the Cup herself.   ST completed informal OM and sensation examination, noting pt is missing top half of natural dentition, however ST noted adequate natural top L and total bottom dentition for chewing. Pt shared that she has been fitted for an upper partial dentures for the top right side, but she prefers to not use them d/t discomfort. Pt exhibited lip, jaw, and tongue strength w/ WFL rotary chewing and oral clearing. ST noted slight left facial droop, possibly d/t new right internal capsule stroke per Head CT.  Of note, pt endorsed min difficulty w/ mastication w/ certain tougher to chew foods - suspect impacted by missing dentition. Pt self-fed 2-3 bites of each scrambled eggs, then multiple bites of grits, and muffin using right hand w/o any immediate overt  s/s of aspiration noted (throat clearing, coughing, choking, changes in respiratory patterns). ST noted pt had min, inconsistent residue post swallow, but was able to clear w/ another volitional swallow or lingual sweeping independently. ST also noted pt did not clear food w/ tongue from left corner of mouth x1, likely d/t loss of sensation on the L side, per H&P. Pt also self-fed ~4+oz water and orange juice via straw (preference  at home) w/o any immediate overt s/s of aspiration or decline in respiratory effort/presentation during/post trials.  Of note, pt exhibited a hoarse, breathy, and dysphonic vocal quality during this session prior to any PO trials - ST suspects unrelated to swallow function. This was noted in her conversation w/ NSG upon entering room and during conversation w/ ST. Pt was unable to recall onset of change in voice, however she shared it has "probably been a long time now." Unsure if this vocal quality is directly related to new CVA vs chronic, but pt is recommended to have f/u w/ ENT for assessment of VC function/dysfunction. ST did note a louder and more clear vocal quality when pt laughed and put more effort behind her speech/response to NSG x1.   Recommend: continue currently ordered diet of Heart Healthy, Carb Mod. w/ Dysphagia III (MEHCANICAL SOFT consistency for easier mastication of the tougher foods d/t missing dentition and not wearing partials) w/ extra butters, gravies, and sauces for cohesion;  w/ THIN liquids via cup/straws okay. Tray-set up and assistance for compensatory strategies as needed. General aspiration precautions; upright position, minimized environmental distractions, small bites & sips. Recommend: ENT consult to assess pt's vocal cord function d/t hoarse, breathy, and dysphonic vocal quality. NSG consulted and agreed. ST to f/u tomorrow for toleration of diet x1-2 before signing off.   SLP Visit Diagnosis: Dysphagia, oral phase (R13.11)    Aspiration Risk  Mild aspiration risk but Reduced when following general aspiration precautions   Diet Recommendation   Heart Healthy Dysphagia III (MEHCANICAL soft for easier mastication d/t missing dentition) w/ thin liquids; extra butters, gravies, and sauces for cohesion. Cup/Straw ok. Tray setup at all meals.   Medication Administration: Whole Pills ONE AT A TIME with liquid, (puree if needed)    Other  Recommendations Recommended  Consults: Consider ENT evaluation Oral Care Recommendations: Oral care BID;Oral care before and after PO w/ NSG assistance  Follow up Recommendations Skilled Nursing facility      Frequency and Duration min 2x/week  2 weeks if needed       Prognosis Prognosis for Safe Diet Advancement: Fair-Good Barriers to Reach Goals: Cognitive deficits;Severity of deficits; Time post onset     Swallow Study   General Date of Onset: 10/23/18 HPI: Per admitting H&P, pt is a 85 y.o. female with a known history of mild dementia, diabetes, prior history of strokes with minimal left-sided weakness, history of complete heart block status post pacemaker [facility] secondary to worsening left-sided weakness today. Patient states that she is nonambulatory at baseline due to prior strokes.  She has minimal left-sided weakness at baseline.  She was admitted to our hospital 3 months ago for vertebral artery dissection and altered mental status.  Symptoms resolved and she was at baseline and was discharged back.  Has a permanent pacemaker placed so cannot get an MRI done.  For the last 2 to 3 days, she has noticed worsening weakness of her left arm and has been needing assistance with feeding and other activities.  She states  she is mostly wheelchair-bound at baseline.  Her speech is clear, but this morning she also had some difficulty swallowing and so was brought to the emergency room.  She is oriented x3 currently.  She is on Aggrenox at home. Work-up in the emergency room does indicate that she has 2/5 left upper and lower extremity weakness.  Borderline hyperkalemia on the labs.  CT of the head showing right posterior limb internal capsule infarct which is new compared to prior.  CT Angie of head and neck is pending. Type of Study: Bedside Swallow Evaluation Diet Prior to this Study: Regular;Thin liquids(heart healthy, carb modified) Temperature Spikes Noted: No Respiratory Status: Room  air History of Recent Intubation: No Behavior/Cognition: Cooperative;Pleasant mood;Lethargic/Drowsy;Distractible;Requires cueing Oral Cavity Assessment: Within Functional Limits Oral Care Completed by SLP: No Oral Cavity - Dentition: Missing dentition(missing top right side; no dentures) Vision: Functional for self-feeding Self-Feeding Abilities: Able to feed self;Needs assist;Needs set up Patient Positioning: Upright in bed Baseline Vocal Quality: Breathy;Hoarse;Low vocal intensity Volitional Cough: Other (Comment)(NT)    Oral/Motor/Sensory Function Overall Oral Motor/Sensory Function: Mild impairment Facial ROM: Within Functional Limits Facial Symmetry: Abnormal symmetry left Facial Strength: Reduced left Facial Sensation: Reduced left(bottom lip) Lingual ROM: Within Functional Limits Lingual Symmetry: Within Functional Limits Lingual Strength: Within Functional Limits Lingual Sensation: Within Functional Limits Velum: Within Functional Limits Mandible: Within Functional Limits   Ice Chips Ice chips: Not tested   Thin Liquid Thin Liquid: Within functional limits Presentation: Cup;Self Fed;Straw    Nectar Thick Nectar Thick Liquid: Not tested   Honey Thick Honey Thick Liquid: Not tested   Puree Puree: Within functional limits Presentation: Self Fed;Spoon   Solid     Solid: Within functional limits Presentation: Self Fed;Spoon      Emogene Morgan, Graduate Student SLP 10/24/2018,10:14 AM    This patient treatment was supervised by this clinician.  The note, response to treatment and overall treatment plan has been reviewed and this clinician agrees with the information provided. Jerilynn Som, MS, CCC-SLP 10/24/18, 11:07 AM [phone]

## 2018-10-24 NOTE — Clinical Social Work Note (Signed)
Patient is medically ready for discharge today back to [facility]. CSW notified patient's daughter Skilar Waage [phone] of discharge today. CSW also notified Gavin Pound at [facility] of discharge today. Patient will be transported by EMS. RN to call report and call for transport.   Ruthe Mannan MSW, [address] [phone]

## 2018-10-24 NOTE — NC FL2 (Signed)
[place] MEDICAID FL2 LEVEL OF CARE SCREENING TOOL     IDENTIFICATION  Patient Name: Jessica Gardner Birthdate: 13-Jan-1930 Sex: female Admission Date (Current Location): 10/23/2018  [place] and [place] Number:  Chiropodist and Address:  [facility], [address], [place], [place] [address]      Provider Number: [id number]  Attending Physician Name and Address:  Adrian Saran, MD  Relative Name and Phone Number:       Current Level of Care: Hospital Recommended Level of Care: Skilled Nursing Facility Prior Approval Number:    Date Approved/Denied:   PASRR Number:    Discharge Plan: SNF    Current Diagnoses: Patient Active Problem List   Diagnosis Date Noted  . Stroke (cerebrum) (HCC) 10/23/2018  . CAD (coronary artery disease) 07/31/2018  . Vertebral artery dissection (HCC) 07/31/2018  . Presyncope 01/02/2018  . UTI (urinary tract infection) 01/02/2018  . Dehydration 01/02/2018  . HTN (hypertension) 01/02/2018  . Hyperlipidemia 01/02/2018  . Postural dizziness with presyncope 01/02/2018  . Hypokalemia 01/02/2018  . Left leg weakness 09/09/2017  . Diabetes (HCC) 09/09/2017  . Dementia (HCC) 09/09/2017  . History of stroke 09/09/2017  . Acute bronchitis 09/18/2016  . Elevated troponin 09/18/2016  . Anemia 09/18/2016  . Renal insufficiency 09/18/2016  . Generalized weakness 09/18/2016  . Chest pain 09/17/2016    Orientation RESPIRATION BLADDER Height & Weight     Self, Place  Normal Incontinent Weight: 129 lb 9.6 oz (58.8 kg) Height:  5\' 2"  (157.5 cm)  BEHAVIORAL SYMPTOMS/MOOD NEUROLOGICAL BOWEL NUTRITION STATUS  (none) (none) Continent Diet(Heart Healthy/Carb modified )  AMBULATORY STATUS COMMUNICATION OF NEEDS Skin   Extensive Assist Verbally Other (Comment)(Stage 1 on Coccyx )                       Personal Care Assistance Level of Assistance  Bathing, Feeding, Dressing Bathing Assistance: Limited  assistance Feeding assistance: Independent Dressing Assistance: Limited assistance     Functional Limitations Info  Sight, Hearing, Speech Sight Info: Adequate Hearing Info: Adequate Speech Info: Adequate    SPECIAL CARE FACTORS FREQUENCY                       Contractures Contractures Info: Not present    Additional Factors Info  Code Status, Allergies Code Status Info: DNR Allergies Info: NKA           Current Medications (10/24/2018):  This is the current hospital active medication list Current Facility-Administered Medications  Medication Dose Route Frequency Provider Last Rate Last Dose  . 0.9 %  sodium chloride infusion   Intravenous Continuous Enid Baas, MD 60 mL/hr at 10/24/18 0600    . acetaminophen (TYLENOL) tablet 650 mg  650 mg Oral Q6H PRN Enid Baas, MD       Or  . acetaminophen (TYLENOL) suppository 650 mg  650 mg Rectal Q6H PRN Enid Baas, MD      . amLODipine (NORVASC) tablet 5 mg  5 mg Oral Daily Enid Baas, MD   5 mg at 10/24/18 0853  . aspirin tablet 325 mg  325 mg Oral Daily Ouma, Hubbard Hartshorn, NP      . brinzolamide (AZOPT) 1 % ophthalmic suspension 1 drop  1 drop Both Eyes BID Enid Baas, MD   1 drop at 10/24/18 0855  . cefTRIAXone (ROCEPHIN) 1 g in sodium chloride 0.9 % 100 mL IVPB  1 g Intravenous Q24H Mody, Sital,  MD 200 mL/hr at 10/24/18 0852 1 g at 10/24/18 0852  . docusate sodium (COLACE) capsule 200 mg  200 mg Oral BID Enid Baas, MD   200 mg at 10/24/18 0853  . enoxaparin (LOVENOX) injection 30 mg  30 mg Subcutaneous Q24H Little Ishikawa, RPH      . ferrous fumarate-b12-vitamic C-folic acid (TRINSICON / FOLTRIN) capsule 1 capsule  1 capsule Oral Daily Enid Baas, MD   1 capsule at 10/24/18 0854  . gabapentin (NEURONTIN) capsule 100 mg  100 mg Oral QHS Enid Baas, MD   100 mg at 10/23/18 2204  . insulin aspart (novoLOG) injection 0-9 Units  0-9 Units Subcutaneous  TID WC Mody, Sital, MD      . latanoprost (XALATAN) 0.005 % ophthalmic solution 1 drop  1 drop Both Eyes QHS Enid Baas, MD   1 drop at 10/23/18 2206  . metoprolol succinate (TOPROL-XL) 24 hr tablet 50 mg  50 mg Oral Daily Enid Baas, MD   50 mg at 10/24/18 0855  . ondansetron (ZOFRAN) tablet 4 mg  4 mg Oral Q6H PRN Enid Baas, MD       Or  . ondansetron (ZOFRAN) injection 4 mg  4 mg Intravenous Q6H PRN Enid Baas, MD      . pantoprazole (PROTONIX) EC tablet 40 mg  40 mg Oral Daily Enid Baas, MD   40 mg at 10/24/18 0853  . PARoxetine (PAXIL) tablet 20 mg  20 mg Oral Daily Enid Baas, MD   20 mg at 10/24/18 0854  . polyethylene glycol (MIRALAX / GLYCOLAX) packet 17 g  17 g Oral Daily Enid Baas, MD   17 g at 10/24/18 0853  . polyvinyl alcohol (LIQUIFILM TEARS) 1.4 % ophthalmic solution 1 drop  1 drop Both Eyes TID Enid Baas, MD   1 drop at 10/24/18 0856  . simvastatin (ZOCOR) tablet 20 mg  20 mg Oral QHS Enid Baas, MD   20 mg at 10/23/18 2204  . tamsulosin (FLOMAX) capsule 0.4 mg  0.4 mg Oral Daily Enid Baas, MD   0.4 mg at 10/24/18 0938     Discharge Medications: Please see discharge summary for a list of discharge medications.  Relevant Imaging Results:  Relevant Lab Results:   Additional Information    Annalise Mcdiarmid  Rinaldo Ratel, [address]

## 2018-10-24 NOTE — Clinical Social Work Note (Signed)
Clinical Social Work Assessment  Patient Details  Name: Jessica Gardner MRN: [id number] Date of Birth: 1930/05/17  Date of referral:  10/24/18               Reason for consult:  Facility Placement                Permission sought to share information with:  Case Manager, Magazine features editor, Family Supports Permission granted to share information::  Yes, Verbal Permission Granted  Name::        Agency::     Relationship::     Contact Information:     Housing/Transportation Living arrangements for the past 2 months:  Skilled Building surveyor of Information:  Adult Children Patient Interpreter Needed:  None Criminal Activity/Legal Involvement Pertinent to Current Situation/Hospitalization:  No - Comment as needed Significant Relationships:  Adult Children Lives with:  Facility Resident Do you feel safe going back to the place where you live?  Yes Need for family participation in patient care:  Yes (Comment)  Care giving concerns:  Patient lives at [facility]    Social Worker assessment / plan:  CSW consulted for facility placement. CSW found through chart review that patient lives at [facility]. CSW contacted patient's daughter Jessica Gardner [phone]. Daughter confirms that patient is long term at [facility] and should return there. CSW also contacted Gavin Pound at [facility] who confirms that patient is long term and can return when ready. CSW will continue to follow for discharge planning.   Employment status:  Retired Database administrator PT Recommendations:  Not assessed at this time Information / Referral to community resources:  Skilled Nursing Facility  Patient/Family's Response to care:  Daughter thanked CSW for assistance   Patient/Family's Understanding of and Emotional Response to Diagnosis, Current Treatment, and Prognosis:  Daughter in agreement with discharge plan   Emotional Assessment Appearance:   Appears stated age Attitude/Demeanor/Rapport:    Affect (typically observed):  Accepting, Quiet Orientation:  Oriented to Self, Oriented to Place Alcohol / Substance use:  Not Applicable Psych involvement (Current and /or in the community):  No (Comment)  Discharge Needs  Concerns to be addressed:  Discharge Planning Concerns Readmission within the last 30 days:  No Current discharge risk:  None Barriers to Discharge:  No Barriers Identified   Ruthe Mannan, LCSWA 10/24/2018, 11:09 AM

## 2018-10-24 NOTE — Evaluation (Signed)
Occupational Therapy Evaluation Patient Details Name: Jessica Gardner MRN: [id number] DOB: 09/26/1929 Today's Date: 10/24/2018    History of Present Illness 85 year old female with a past medical history of diabetes, dementia, hypertension, hyperlipidemia, coronary artery disease status post MI / pace maker placement, anemia, renal insufficiency, history of CVA with residual left-sided weakness.  She presented to the ED with CT indicating post limb internal capsule CVA with L side weakness.   Clinical Impression   Pt. Is an 85 y.o. female from [facility] who was admitted for a CVA with Left sided weakness. She has a history of L sided weakness as well and has 2/5 strength and not using for ADLs at this evaluation even with cues. Pt. presents with hx of demential but was oriented to person, place including name of hospital, month and close to number for date, decreased strength, decreased functional hand use, minimal volitional movement, and decreased functional mobility which hinder her ability to complete ADL and IADL tasks. Patient could benefit from skilled OT services to work on LUE neuro muscular re-ed, therapeutic exercises, positioning, and pt./family education.  Rec pt return back to [facility] and continue with OT to increase independence in ADLs and prevent skin breakdown and contractures in LUE and hand.   Follow Up Recommendations  SNF    Equipment Recommendations       Recommendations for Other Services       Precautions / Restrictions Precautions Precautions: Fall Restrictions Weight Bearing Restrictions: No      Mobility Bed Mobility                  Transfers                      Balance                                           ADL either performed or assessed with clinical judgement   ADL Overall ADL's : At baseline                                       General ADL Comments: Pt appears to be at  baseline for ADLs and could benefit from further skilled OT to increase independence and exercises for LUE and hand.  She was able to use R hand to feed herself and complete grooming after set up including brushing teeth and washing face.       Vision Patient Visual Report: No change from baseline Additional Comments: Pt able to read board with NSG name, date, etc without any diffculty in any area.     Perception     Praxis      Pertinent Vitals/Pain Pain Assessment: No/denies pain     Hand Dominance Right   Extremity/Trunk Assessment Upper Extremity Assessment Upper Extremity Assessment: LUE deficits/detail LUE Deficits / Details: Pt is able to actively lift LUE off of bed but only a few inches and grasp is very weak.  LUE Sensation: decreased light touch LUE Coordination: decreased fine motor;decreased gross motor   Lower Extremity Assessment Lower Extremity Assessment: Defer to PT evaluation       Communication Communication Communication: HOH;Other (comment)(decreased vocal quality and speaks softly )   Cognition Arousal/Alertness: Awake/alert Behavior During Therapy: Flat affect  Overall Cognitive Status: History of cognitive impairments - at baseline                                     General Comments       Exercises     Shoulder Instructions      Home Living Family/patient expects to be discharged to:: Skilled nursing facility                                        Prior Functioning/Environment Level of Independence: Needs assistance        Comments: Chart review indicates that pt was mainly bed bound at [facility] and pt states she was getting up to chair but has hx of dementia.  During eval she was oriented to person, place and month.        OT Problem List: Decreased strength;Impaired balance (sitting and/or standing);Decreased cognition;Decreased activity tolerance;Decreased range of motion;Impaired  sensation;Impaired UE functional use      OT Treatment/Interventions: Self-care/ADL training;Therapeutic activities;Patient/family education;Neuromuscular education    OT Goals(Current goals can be found in the care plan section) Acute Rehab OT Goals Patient Stated Goal: to go back to [facility] OT Goal Formulation: With patient Time For Goal Achievement: 11/07/18 Potential to Achieve Goals: Fair ADL Goals Pt Will Perform Grooming: with set-up;bed level;with min guard assist Pt Will Perform Upper Body Dressing: with set-up;with min assist;sitting Pt Will Perform Lower Body Dressing: with set-up;with mod assist;bed level Pt Will Transfer to Toilet: with set-up;with +2 assist  OT Frequency: Min 1X/week   Barriers to D/C:            Co-evaluation              AM-PAC OT "6 Clicks" Daily Activity     Outcome Measure Help from another person eating meals?: A Little Help from another person taking care of personal grooming?: A Little Help from another person toileting, which includes using toliet, bedpan, or urinal?: Total Help from another person bathing (including washing, rinsing, drying)?: Total Help from another person to put on and taking off regular upper body clothing?: A Lot Help from another person to put on and taking off regular lower body clothing?: Total 6 Click Score: 11   End of Session    Activity Tolerance: Patient tolerated treatment well Patient left: in bed;with call bell/phone within reach;with bed alarm set  OT Visit Diagnosis: Muscle weakness (generalized) (M62.81);Other symptoms and signs involving the nervous system (R29.898);Hemiplegia and hemiparesis Hemiplegia - Right/Left: Left Hemiplegia - dominant/non-dominant: Non-Dominant Hemiplegia - caused by: Cerebral infarction                Time: 0945-1010 OT Time Calculation (min): 25 min Charges:  OT General Charges $OT Visit: 1 Visit OT Evaluation $OT Eval Low Complexity: 1 Low OT  Treatments $Self Care/Home Management : 8-22 mins  Susanne Borders, OTR/L ascom [phone] 10/24/18, 11:15 AM

## 2018-10-24 NOTE — Progress Notes (Signed)
PT Evaluation Note  PT Assessment: Jessica Gardner admitted for the above and presents with the following deficits listed below (see PT problem list). PTA pt reports she was nonambulatory and in a manual wheelchair where someone would push her. Pt resides at [facility] and reports that she required assistance for all ADLs. Mod A x2 was required for all aspects of bed mobility. Pt reported 4/10 dizziness in supine, BP 116/63. Dizziness decreased with rest. Sitting at EOB 3/10 dizziness and BP 114/60. Dizziness dissipated following seated rest break. Mod-Max A x2 was provided for sit<>stand and to maintain standing balance. Pt given verbal and tactile cues to maintain upright posture as she tended to lean to the L. No further mobility performed due to amount of assist provided for standing. Upon discharge SPT recommending SNF to address mobility deficits.    10/24/18 1218  PT Visit Information  Last PT Received On 10/24/18  Assistance Needed +2  History of Present Illness Pt is a 85 y.o female that presented to the ED with L sided weakness and facial drooping from [facility]. Head CT was positive for posterior limb R internal capsul infarct which was treated with aspirin. Head and Neck CTA negative for any occlusions. Negative for DVT LUE. Pt has PMH of CVA, permanent pacemaker, MI, DM, dementia, and vertebral artery dissection. PTA pt reports she was nonambulatory.  Precautions  Precautions Fall  Restrictions  Weight Bearing Restrictions No  Home Living  Family/patient expects to be discharged to: Skilled nursing facility  Prior Function  Level of Independence Needs assistance  Gait / Transfers Assistance Needed Pt reported she hasnt walked in 6-7 years. Currently uses a manual WC to get around but is pushed. States that she has not fallen/ slid out of her chair in past 6 months.  ADL's / Homemaking Assistance Needed Pt required assistance with all ADL's.  Communication  Communication HOH (soft  spoken )  Pain Assessment  Pain Assessment No/denies pain  Cognition  Arousal/Alertness Awake/alert  Behavior During Therapy Flat affect  Overall Cognitive Status No family/caregiver present to determine baseline cognitive functioning  Area of Impairment Attention;Problem solving;Following commands;Safety/judgement;Awareness  Current Attention Level Focused  Following Commands Follows one step commands consistently  Safety/Judgement Decreased awareness of safety  Awareness Emergent  Problem Solving Decreased initiation;Difficulty sequencing;Requires verbal cues;Requires tactile cues;Slow processing  General Comments Per chart review pt has history of cognitive deficits. Pt oriented to person, place, situation, and year.  Upper Extremity Assessment  Upper Extremity Assessment LUE deficits/detail;RUE deficits/detail  RUE Deficits / Details Pt able to flex arm above head. Strength grossly 3+/5  RUE Sensation WNL  RUE Coordination decreased fine motor  LUE Deficits / Details Pt is unable to lift UE strength is significantly impaired s/p CVA. Pt is unable to maintain position if placed into flexion. Strength grossly 1/5. Pt able to wiggle fingers and losely grab SPT finger.  LUE Sensation WNL  LUE Coordination decreased gross motor;decreased fine motor (Pt attempt fine motor- thumb to mid phalanx)  Lower Extremity Assessment  Lower Extremity Assessment RLE deficits/detail;LLE deficits/detail  RLE Deficits / Details Pt able to perform SLR without physical assist. Strength grossly 3+/5.   RLE Sensation WNL  RLE Coordination WNL  LLE Deficits / Details Pt unable to lift LE- significant strength impairments s/p CVA. Unable to maintain position if placed there. Strength grossly 1/5. Dorsiflexion to neutral. Able to wiggle toes.   LLE Sensation WNL  LLE Coordination decreased gross motor  Bed Mobility  Overal bed mobility Needs Assistance  Bed Mobility Supine to Sit;Sit to Supine  Supine to  sit Mod assist;+2 for physical assistance;HOB elevated  Sit to supine Mod assist;+2 for physical assistance  General bed mobility comments  Pt reported 4/10 dizziness in supine BP 116/63. Dizziness decreased with rest. Pt was able to move RLE towards EOB but required mod A for LLE. Sitting at EOB 3/10 dizziness and BP 114/60. Dizziness dissipated following seated rest break. Mod A x2 required for sup<>sit.   Transfers  Overall transfer level Needs assistance  Equipment used Rolling walker (2 wheeled)  Transfers Sit to/from Stand  Sit to Stand Max assist;+2 physical assistance  General transfer comment Mod-Max A x2 for sit to stand. Mod-Max A x2 porvided to maintain stability in standing with pt BUE support of RW. Verbal and tactile cues provided to maintain upright posture and to correct L lateral lean.   Ambulation/Gait  General Gait Details Unsafe to ambulate at this time due to Max A provided for STS.   Balance  Overall balance assessment Needs assistance  Sitting-balance support Single extremity supported;Feet supported  Sitting balance-Leahy Scale Poor  Sitting balance - Comments Pt was able to maintain sitting balance with use of RUE holding bed rail. If challenged pt suspected to lose balance. SPT min guard-Min A intermittently for safety.  Standing balance support Bilateral upper extremity supported;During functional activity  Standing balance-Leahy Scale Zero  Standing balance comment Pt required Mod-Max A x2 support to maintain standing balance with pt utilizing BUE of RW.   General Comments  General comments (skin integrity, edema, etc.) Pt taking deep breaths and appearing to be short of breath periodically throughout session. SpO2 monitored with SOB symptoms SpO2 remained at 96% or higher.  Exercises  Exercises General Lower Extremity  General Exercises - Lower Extremity  Ankle Circles/Pumps AROM;Both;10 reps;Supine  PT - End of Session  Equipment Utilized During Treatment Gait  belt  Activity Tolerance Patient limited by fatigue  Patient left in bed;with call bell/phone within reach;with bed alarm set;with SCD's reapplied  Nurse Communication Mobility status  PT Assessment  PT Recommendation/Assessment Patient needs continued PT services  PT Visit Diagnosis Unsteadiness on feet (R26.81);Muscle weakness (generalized) (M62.81);Other symptoms and signs involving the nervous system (R29.898);Hemiplegia and hemiparesis;Other abnormalities of gait and mobility (R26.89)  Hemiplegia - Right/Left Left  Hemiplegia - dominant/non-dominant Non-dominant  Hemiplegia - caused by Cerebral infarction  PT Problem List Decreased strength;Decreased range of motion;Decreased activity tolerance;Decreased balance;Decreased mobility;Decreased coordination;Decreased cognition;Decreased knowledge of use of DME;Decreased safety awareness  PT Plan  PT Frequency (ACUTE ONLY) 7X/week  PT Treatment/Interventions (ACUTE ONLY) DME instruction;Gait training;Functional mobility training;Therapeutic activities;Therapeutic exercise;Balance training;Neuromuscular re-education;Cognitive remediation;Patient/family education;Wheelchair mobility training  AM-PAC PT "6 Clicks" Mobility Outcome Measure (Version 2)  Help needed turning from your back to your side while in a flat bed without using bedrails? 2  Help needed moving from lying on your back to sitting on the side of a flat bed without using bedrails? 2  Help needed moving to and from a bed to a chair (including a wheelchair)? 2  Help needed standing up from a chair using your arms (e.g., wheelchair or bedside chair)? 2  Help needed to walk in hospital room? 1  Help needed climbing 3-5 steps with a railing?  1  6 Click Score 10  Consider Recommendation of Discharge To: CIR/SNF/LTACH  PT Recommendation  Follow Up Recommendations SNF  PT equipment Other (comment) (TBD at next venue )  Individuals Consulted  Consulted and Agree with Results and  Recommendations Patient  Acute Rehab PT Goals  Patient Stated Goal To get better  PT Goal Formulation With patient  Time For Goal Achievement 11/07/18  Potential to Achieve Goals Fair  PT Time Calculation  PT Start Time (ACUTE ONLY) 1102  PT Stop Time (ACUTE ONLY) 1142  PT Time Calculation (min) (ACUTE ONLY) 40 min  PT General Charges  $$ ACUTE PT VISIT 1 Visit  PT Evaluation  $PT Eval Moderate Complexity 1 Mod  PT Treatments  $Therapeutic Activity 23-37 mins  Written Expression  Dominant Hand Right   Emeline GinsMegan Taray Normoyle, SPT

## 2018-10-24 NOTE — Discharge Summary (Signed)
[employer] [facility] [place] [facility]   PATIENT NAME: Jessica Gardner    MR#:  [id number]  DATE OF BIRTH:  [birth year]  DATE OF ADMISSION:  10/23/2018 ADMITTING PHYSICIAN: Enid Baas, MD  DATE OF DISCHARGE: 10/24/2018  PRIMARY CARE PHYSICIAN: Barbette Reichmann, MD    ADMISSION DIAGNOSIS:  Cerebrovascular accident (CVA), unspecified mechanism (HCC) [I63.9]  DISCHARGE DIAGNOSIS:  Active Problems:   Stroke (cerebrum) (HCC)   SECONDARY DIAGNOSIS:   Past Medical History:  Diagnosis Date  . Dementia (HCC)   . Diabetes mellitus   . MI (myocardial infarction) (HCC)   . Pacemaker   . Stroke [facility])     HOSPITAL COURSE:  85 year old female with a history of multiple strokes in the past with left-sided weakness, complete heart block status post pacemaker who presents from [facility] due to worsening of left-sided weakness.   1.  Acute infarct posterior limb right internal capsule: Patient unable to obtain MRI due to history of pacemaker.  Patient was eval by neurology.  Recommendations are to discontinue Aggrenox and start full dose aspirin 325 mg daily.  She will not be discharged on Plavix as per neurology.  She will need outpatient follow-up with cardiology for 30-day event recorder.  This order was placed prior to discharge. She was evaluated by appropriate services and will be discharged to skilled nursing facility. She will also need to continue statin 2.  Hyperkalemia: This has resolved  3.  Essential hypertension: Patient will continue lisinopril, Norvasc, metoprolol  4.  Diabetes: A1c was 6.8.  5.  UTI: Patient will be discharged on oral Keflex.  DISCHARGE CONDITIONS AND DIET:  Stable for discharge    diet of Heart Healthy, Carb Mod. w/ Dysphagia III (MEHCANICAL SOFT consistency for easier mastication of the tougher foods d/t missing dentition and not wearing partials) w/ extra butters, gravies, and sauces for cohesion;  w/ THIN liquids via  cup/straws okay. Tray-set up and assistance for compensatory strategies as needed. General aspiration precautions; upright position, minimized environmental distractions, small bites & sips. CONSULTS OBTAINED:  Treatment Team:  Kym Groom, MD  DRUG ALLERGIES:  No Known Allergies  DISCHARGE MEDICATIONS:   Allergies as of 10/24/2018   No Known Allergies     Medication List    STOP taking these medications   dipyridamole-aspirin 200-25 MG 12hr capsule Commonly known as:  AGGRENOX     TAKE these medications   acetaminophen 325 MG tablet Commonly known as:  TYLENOL Take 2 tablets (650 mg total) by mouth every 4 (four) hours as needed for mild pain (or temp > 37.5 C (99.5 F)).   amLODipine 5 MG tablet Commonly known as:  NORVASC Take 5 mg by mouth daily.   aspirin 325 MG tablet Take 1 tablet (325 mg total) by mouth daily.   brinzolamide 1 % ophthalmic suspension Commonly known as:  AZOPT Place 1 drop into both eyes 2 (two) times daily.   cephALEXin 250 MG capsule Commonly known as:  KEFLEX Take 1 capsule (250 mg total) by mouth 2 (two) times daily for 5 days.   docusate sodium 100 MG capsule Commonly known as:  COLACE Take 2 capsules (200 mg total) by mouth 2 (two) times daily.   gabapentin 100 MG capsule Commonly known as:  NEURONTIN Take 100 mg by mouth at bedtime.   INTEGRA F 125-1 MG Caps Take 1 capsule by mouth daily.   Ipratropium-Albuterol 20-100 MCG/ACT Aers respimat Commonly known as:  COMBIVENT RESPIMAT Inhale 1 puff  into the lungs every 6 (six) hours.   latanoprost 0.005 % ophthalmic solution Commonly known as:  XALATAN Place 1 drop into both eyes at bedtime.   lisinopril 20 MG tablet Commonly known as:  PRINIVIL,ZESTRIL Take 20 mg by mouth daily.   metFORMIN 500 MG tablet Commonly known as:  GLUCOPHAGE Take 500 mg by mouth 2 (two) times daily.   metoprolol succinate 50 MG 24 hr tablet Commonly known as:  TOPROL-XL Take 50 mg by mouth  daily.   omeprazole 10 MG capsule Commonly known as:  PRILOSEC Take 10 mg by mouth daily.   PARoxetine 20 MG tablet Commonly known as:  PAXIL Take 20 mg by mouth daily.   polyethylene glycol packet Commonly known as:  MIRALAX / GLYCOLAX Take 17 g by mouth daily.   simvastatin 20 MG tablet Commonly known as:  ZOCOR Take 20 mg by mouth at bedtime.   SYSTANE BALANCE 0.6 % Soln Generic drug:  Propylene Glycol Place 1 drop into both eyes 3 (three) times daily.   tamsulosin 0.4 MG Caps capsule Commonly known as:  FLOMAX Take 1 capsule (0.4 mg total) by mouth daily.   Vitamin D (Ergocalciferol) 1.25 MG (50000 UT) Caps capsule Commonly known as:  DRISDOL Take 50,000 Units by mouth every Sunday.         Today   CHIEF COMPLAINT:  Patient is alert and oriented and at baseline.   VITAL SIGNS:  Blood pressure 119/71, pulse 75, temperature 98.6 F (37 C), temperature source Oral, resp. rate 18, height 5\' 2"  (1.575 m), weight 58.8 kg, SpO2 94 %.   REVIEW OF SYSTEMS:  Review of Systems  Constitutional: Negative.  Negative for chills, fever and malaise/fatigue.  HENT: Negative.  Negative for ear discharge, ear pain, hearing loss, nosebleeds and sore throat.   Eyes: Negative.  Negative for blurred vision and pain.  Respiratory: Negative.  Negative for cough, hemoptysis, shortness of breath and wheezing.   Cardiovascular: Negative.  Negative for chest pain, palpitations and leg swelling.  Gastrointestinal: Negative.  Negative for abdominal pain, blood in stool, diarrhea, nausea and vomiting.  Genitourinary: Negative.  Negative for dysuria.  Musculoskeletal: Negative.  Negative for back pain.  Skin: Negative.   Neurological: Positive for focal weakness and weakness. Negative for dizziness, tremors, speech change, seizures and headaches.  Endo/Heme/Allergies: Negative.  Does not bruise/bleed easily.  Psychiatric/Behavioral: Negative.  Negative for depression, hallucinations and  suicidal ideas.     PHYSICAL EXAMINATION:  GENERAL:  85 y.o.-year-old elderly patient lying in the bed with no acute distress.  EYES: Pupils equal, round, reactive to light and accommodation. No scleral icterus. Extraocular muscles intact.  HEENT: Head atraumatic, normocephalic. Oropharynx and nasopharynx clear.  NECK:  Supple, no jugular venous distention. No thyroid enlargement, no tenderness.  LUNGS: Normal breath sounds bilaterally, no wheezing, rales,rhonchi or crepitation. No use of accessory muscles of respiration.  Decreased bibasilar breath sounds. CARDIOVASCULAR: S1, S2 normal. No  rubs, or gallops.  2/6 systolic murmur is present.  Pacemaker is present ABDOMEN: Soft, nontender, nondistended. Bowel sounds present. No organomegaly or mass.  EXTREMITIES: No pedal edema, cyanosis, or clubbing.  NEUROLOGIC: Cranial nerves II through XII are intact.  old left facial droop noted.  Muscle strength 5/5 in both right upper and lower extremities.  2/5 in left upper and lower extremities.. Sensation intact.  Gait not checked.  PSYCHIATRIC: The patient is alert and oriented x 3.  SKIN: No obvious rash, lesion, or ulcer.   DATA REVIEW:  CBC Recent Labs  Lab 10/24/18 0431  WBC 5.7  HGB 9.9*  HCT 30.3*  PLT 363    Chemistries  Recent Labs  Lab 10/23/18 1505 10/24/18 0431  NA 142 141  K 5.0 3.6  CL 106 108  CO2 25 26  GLUCOSE 129* 105*  BUN 20 19  CREATININE 1.25* 0.99  CALCIUM 9.4 9.0  AST 24  --   ALT 19  --   ALKPHOS 56  --   BILITOT 0.7  --     Cardiac Enzymes Recent Labs  Lab 10/23/18 1505  TROPONINI <0.03    Microbiology Results  @[email or web address] @  RADIOLOGY:  Ct Angio Head W Or Wo Contrast  Result Date: 10/23/2018 CLINICAL DATA:  Follow-up examination for acute stroke. EXAM: CT ANGIOGRAPHY HEAD AND NECK TECHNIQUE: Multidetector CT imaging of the head and neck was performed using the standard protocol during bolus administration of intravenous contrast.  Multiplanar CT image reconstructions and MIPs were obtained to evaluate the vascular anatomy. Carotid stenosis measurements (when applicable) are obtained utilizing NASCET criteria, using the distal internal carotid diameter as the denominator. CONTRAST:  60mL OMNIPAQUE IOHEXOL 350 MG/ML SOLN COMPARISON:  Prior head CT from earlier the same day. FINDINGS: CTA NECK FINDINGS Aortic arch: Visualized aortic arch of normal caliber with normal 3 vessel morphology. Moderate atheromatous plaque at the origin of the left subclavian artery without significant stenosis. No high-grade stenosis about the origin of the great vessels. Visualized subclavian arteries widely patent. Right carotid system: Right CCA patent from its origin to the bifurcation without stenosis. Calcified plaque at the right bifurcation/proximal right ICA with associated stenosis of up to approximately 40% by NASCET criteria. Right ICA otherwise widely patent to the skull base without stenosis, dissection, or occlusion. Left carotid system: Left CCA patent from its origin to the bifurcation without stenosis. Calcified plaque at the left bifurcation with associated stenosis of up to approximately 50% by NASCET criteria. Left ICA otherwise widely patent distally to the skull base without stenosis, dissection, or occlusion. Vertebral arteries: Both of the vertebral arteries arise from the subclavian arteries. Right vertebral artery dominant and widely patent to the vertebrobasilar junction. Left vertebral artery diffusely hypoplastic, and essentially occludes at the V3 segment, with no significant flow into the cranial vault. Skeleton: No acute osseous finding. No discrete lytic or blastic osseous lesions. Other neck: No other acute soft tissue abnormality within the neck. Left maxillary sinus retention cyst noted. Upper chest: Visualized upper chest demonstrates no acute finding. Partially visualized lungs are clear. Left-sided pacemaker/AICD noted. Review  of the MIP images confirms the above findings CTA HEAD FINDINGS Anterior circulation: Calcified plaque within the petrous segments bilaterally with resultant mild multifocal narrowing. Prominent carotid siphon atherosclerosis with resultant moderate stenoses bilaterally (up to approximately 50-70% bilaterally, slightly worse on the left). ICA termini patent. A1 segments patent bilaterally. Normal anterior communicating artery. Anterior cerebral arteries demonstrate scattered atheromatous irregularity but are patent to their distal aspects without significant stenosis. Short-segment moderate stenosis at the origin of the right M1 segment. Right M1 other wise irregular but patent to its distal aspect. Normal right MCA bifurcation. Distal right MCA branches well perfused. Short-segment moderate stenosis at the origin of the left M1 (series 6, image 207). Left M1 widely patent distally. Distal left MCA branches well perfused. Distal small vessel atheromatous irregularity noted throughout the MCA branches bilaterally. Posterior circulation: Scattered atheromatous plaque within the proximal right V4 segment with up to moderate approximate 30-50% stenosis. Left vertebral occluded  at the skull base and not seen. Posterior inferior cerebral arteries not visualized bilaterally. Tortuous with short-segment moderate tandem stenoses involving its proximal and mid aspect. Basilar patent distally to the basilar tip. Superior cerebral arteries patent bilaterally. Left PCA primarily supplied via the basilar. Right PCA supplied via a hypoplastic right P1 and prominent right posterior communicating artery. Venous sinuses: Patent. Anatomic variants: None significant. Delayed phase: No abnormal enhancement. Review of the MIP images confirms the above findings IMPRESSION: 1. Negative CTA for large vessel occlusion. 2. 40-50% atheromatous stenoses about the carotid bifurcations bilaterally, left worse than right. 3. Diffusely hypoplastic  left vertebral artery, which occludes at its distal aspect prior to the skull base. Dominant right vertebral artery widely patent within the neck. 4. Advanced carotid siphon atherosclerosis with associated moderate multifocal narrowing (approximately 50-70% bilaterally). 5. Short-segment moderate stenoses at the origin of the M1 segments bilaterally. 6. Moderate tandem proximal-mid basilar stenoses. Electronically Signed   By: Rise Mu M.D.   On: 10/23/2018 17:57   Ct Head Wo Contrast  Result Date: 10/23/2018 CLINICAL DATA:  85 year old female with left upper extremity and left lower extremity weakness with left-sided facial droop. History of infarcts and dementia. Initial encounter. EXAM: CT HEAD WITHOUT CONTRAST TECHNIQUE: Contiguous axial images were obtained from the base of the skull through the vertex without intravenous contrast. COMPARISON:  07/31/2018 head CT. FINDINGS: Brain: Infarct posterior limb right internal capsule new from the prior examination of indeterminate age. Prominent chronic microvascular changes. Global atrophy. No intracranial mass lesion noted on this unenhanced exam. Vascular: Prominent vascular calcifications. No definitive evidence of acute hyperdense vessel. Skull: No acute abnormality. Sinuses/Orbits: No acute orbital abnormality. Visualized paranasal sinuses clear. Other: Mastoid air cells and middle ear cavities are clear. IMPRESSION: 1. Infarct posterior limb right internal capsule new from the prior examination of indeterminate age. 2. Prominent chronic microvascular changes. 3. Global atrophy. 4. Prominent vascular calcifications. Electronically Signed   By: Lacy Duverney M.D.   On: 10/23/2018 15:34   Ct Angio Neck W Or Wo Contrast  Result Date: 10/23/2018 CLINICAL DATA:  Follow-up examination for acute stroke. EXAM: CT ANGIOGRAPHY HEAD AND NECK TECHNIQUE: Multidetector CT imaging of the head and neck was performed using the standard protocol during bolus  administration of intravenous contrast. Multiplanar CT image reconstructions and MIPs were obtained to evaluate the vascular anatomy. Carotid stenosis measurements (when applicable) are obtained utilizing NASCET criteria, using the distal internal carotid diameter as the denominator. CONTRAST:  60mL OMNIPAQUE IOHEXOL 350 MG/ML SOLN COMPARISON:  Prior head CT from earlier the same day. FINDINGS: CTA NECK FINDINGS Aortic arch: Visualized aortic arch of normal caliber with normal 3 vessel morphology. Moderate atheromatous plaque at the origin of the left subclavian artery without significant stenosis. No high-grade stenosis about the origin of the great vessels. Visualized subclavian arteries widely patent. Right carotid system: Right CCA patent from its origin to the bifurcation without stenosis. Calcified plaque at the right bifurcation/proximal right ICA with associated stenosis of up to approximately 40% by NASCET criteria. Right ICA otherwise widely patent to the skull base without stenosis, dissection, or occlusion. Left carotid system: Left CCA patent from its origin to the bifurcation without stenosis. Calcified plaque at the left bifurcation with associated stenosis of up to approximately 50% by NASCET criteria. Left ICA otherwise widely patent distally to the skull base without stenosis, dissection, or occlusion. Vertebral arteries: Both of the vertebral arteries arise from the subclavian arteries. Right vertebral artery dominant and widely patent  to the vertebrobasilar junction. Left vertebral artery diffusely hypoplastic, and essentially occludes at the V3 segment, with no significant flow into the cranial vault. Skeleton: No acute osseous finding. No discrete lytic or blastic osseous lesions. Other neck: No other acute soft tissue abnormality within the neck. Left maxillary sinus retention cyst noted. Upper chest: Visualized upper chest demonstrates no acute finding. Partially visualized lungs are clear.  Left-sided pacemaker/AICD noted. Review of the MIP images confirms the above findings CTA HEAD FINDINGS Anterior circulation: Calcified plaque within the petrous segments bilaterally with resultant mild multifocal narrowing. Prominent carotid siphon atherosclerosis with resultant moderate stenoses bilaterally (up to approximately 50-70% bilaterally, slightly worse on the left). ICA termini patent. A1 segments patent bilaterally. Normal anterior communicating artery. Anterior cerebral arteries demonstrate scattered atheromatous irregularity but are patent to their distal aspects without significant stenosis. Short-segment moderate stenosis at the origin of the right M1 segment. Right M1 other wise irregular but patent to its distal aspect. Normal right MCA bifurcation. Distal right MCA branches well perfused. Short-segment moderate stenosis at the origin of the left M1 (series 6, image 207). Left M1 widely patent distally. Distal left MCA branches well perfused. Distal small vessel atheromatous irregularity noted throughout the MCA branches bilaterally. Posterior circulation: Scattered atheromatous plaque within the proximal right V4 segment with up to moderate approximate 30-50% stenosis. Left vertebral occluded at the skull base and not seen. Posterior inferior cerebral arteries not visualized bilaterally. Tortuous with short-segment moderate tandem stenoses involving its proximal and mid aspect. Basilar patent distally to the basilar tip. Superior cerebral arteries patent bilaterally. Left PCA primarily supplied via the basilar. Right PCA supplied via a hypoplastic right P1 and prominent right posterior communicating artery. Venous sinuses: Patent. Anatomic variants: None significant. Delayed phase: No abnormal enhancement. Review of the MIP images confirms the above findings IMPRESSION: 1. Negative CTA for large vessel occlusion. 2. 40-50% atheromatous stenoses about the carotid bifurcations bilaterally, left  worse than right. 3. Diffusely hypoplastic left vertebral artery, which occludes at its distal aspect prior to the skull base. Dominant right vertebral artery widely patent within the neck. 4. Advanced carotid siphon atherosclerosis with associated moderate multifocal narrowing (approximately 50-70% bilaterally). 5. Short-segment moderate stenoses at the origin of the M1 segments bilaterally. 6. Moderate tandem proximal-mid basilar stenoses. Electronically Signed   By: Rise MuBenjamin  McClintock M.D.   On: 10/23/2018 17:57      Allergies as of 10/24/2018   No Known Allergies     Medication List    STOP taking these medications   dipyridamole-aspirin 200-25 MG 12hr capsule Commonly known as:  AGGRENOX     TAKE these medications   acetaminophen 325 MG tablet Commonly known as:  TYLENOL Take 2 tablets (650 mg total) by mouth every 4 (four) hours as needed for mild pain (or temp > 37.5 C (99.5 F)).   amLODipine 5 MG tablet Commonly known as:  NORVASC Take 5 mg by mouth daily.   aspirin 325 MG tablet Take 1 tablet (325 mg total) by mouth daily.   brinzolamide 1 % ophthalmic suspension Commonly known as:  AZOPT Place 1 drop into both eyes 2 (two) times daily.   cephALEXin 250 MG capsule Commonly known as:  KEFLEX Take 1 capsule (250 mg total) by mouth 2 (two) times daily for 5 days.   docusate sodium 100 MG capsule Commonly known as:  COLACE Take 2 capsules (200 mg total) by mouth 2 (two) times daily.   gabapentin 100 MG capsule Commonly known as:  NEURONTIN Take 100 mg by mouth at bedtime.   INTEGRA F 125-1 MG Caps Take 1 capsule by mouth daily.   Ipratropium-Albuterol 20-100 MCG/ACT Aers respimat Commonly known as:  COMBIVENT RESPIMAT Inhale 1 puff into the lungs every 6 (six) hours.   latanoprost 0.005 % ophthalmic solution Commonly known as:  XALATAN Place 1 drop into both eyes at bedtime.   lisinopril 20 MG tablet Commonly known as:  PRINIVIL,ZESTRIL Take 20 mg by  mouth daily.   metFORMIN 500 MG tablet Commonly known as:  GLUCOPHAGE Take 500 mg by mouth 2 (two) times daily.   metoprolol succinate 50 MG 24 hr tablet Commonly known as:  TOPROL-XL Take 50 mg by mouth daily.   omeprazole 10 MG capsule Commonly known as:  PRILOSEC Take 10 mg by mouth daily.   PARoxetine 20 MG tablet Commonly known as:  PAXIL Take 20 mg by mouth daily.   polyethylene glycol packet Commonly known as:  MIRALAX / GLYCOLAX Take 17 g by mouth daily.   simvastatin 20 MG tablet Commonly known as:  ZOCOR Take 20 mg by mouth at bedtime.   SYSTANE BALANCE 0.6 % Soln Generic drug:  Propylene Glycol Place 1 drop into both eyes 3 (three) times daily.   tamsulosin 0.4 MG Caps capsule Commonly known as:  FLOMAX Take 1 capsule (0.4 mg total) by mouth daily.   Vitamin D (Ergocalciferol) 1.25 MG (50000 UT) Caps capsule Commonly known as:  DRISDOL Take 50,000 Units by mouth every Sunday.          Management plans discussed with the patient and she is in agreement. Stable for discharge snf  Patient should follow up with pcp  CODE STATUS:     Code Status Orders  (From admission, onward)         Start     Ordered   10/23/18 1941  Do not attempt resuscitation (DNR)  Continuous    Question Answer Comment  In the event of cardiac or respiratory ARREST Do not call a "code blue"   In the event of cardiac or respiratory ARREST Do not perform Intubation, CPR, defibrillation or ACLS   In the event of cardiac or respiratory ARREST Use medication by any route, position, wound care, and other measures to relive pain and suffering. May use oxygen, suction and manual treatment of airway obstruction as needed for comfort.      02 /10/20 1940        Code Status History    Date Active Date Inactive Code Status Order ID Comments User Context   08/01/2018 1330 08/04/2018 1937 DNR [id number]  Auburn Bilberry, MD Inpatient   07/31/2018 2352 08/01/2018 1329 Full Code  [id number]  Oralia Manis, MD Inpatient   01/02/2018 0126 01/03/2018 1721 Full Code [id number]  Rometta Emery, MD ED   09/09/2017 0146 09/13/2017 1942 Full Code [id number]  Oralia Manis, MD Inpatient   09/17/2016 1557 09/18/2016 1728 Full Code [id number]  Katha Hamming, MD ED      TOTAL TIME TAKING CARE OF THIS PATIENT: 38 minutes.    Note: This dictation was prepared with Dragon dictation along with smaller phrase technology. Any transcriptional errors that result from this process are unintentional.  Thanh Pomerleau M.D on 10/24/2018 at 11:20 AM  Between 7am to 6pm - Pager - [phone] After 6pm go to [email or web address] - Social research officer, government  [facility]  Office  [phone]  CC: Primary care physician; Barbette Reichmann, MD

## 2018-10-24 NOTE — Consult Note (Addendum)
Referring Physician: Mody,Sital MD    Chief Complaint: Worsening left side weakness and left facial droop.  HPI: Jessica Gardner is an 85 y.o. female Jessica Gardner is an 85 y.o. female pertinent history of type 2 diabetes mellitus, CKD stage III, CVA with residual left side weakness, hypertension, hyperlipidemia, obstructive sleep apnea, peripheral neuropathy, anemia, myocardial infarction, and sick sinus syndrome status post permanent pacemaker placement [facility] [place] c/o LUE and LLE weakness with L-sided facial droop. EMS report [facility] 10/19/18.  Per ED reports, patient reported that weakness was worse than usual but unsure if she had residual symptoms from prior stroke.  Denies other associated symptoms of altered sensorium, speech abnormality, cranial nerve deficit, seizures, focal sensory deficits, diplopia, nausea or vomiting, syncope or LOC, paresthesia (numbness, tingling, pins-and-needles sensation).  Initial NIH stroke scale on presentation 10. 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 No emergent large vessel occlusion noted.  Patient was therefore admitted for further stroke work-up and management.  Date last known well: Date: 10/19/2018 Time last known well: Unable to determine tPA Given: No: Outside the window.  Past Medical History:  Diagnosis Date  . Dementia (HCC)   . Diabetes mellitus   . MI (myocardial infarction) (HCC)   . Pacemaker   . Stroke [facility](HCC)     Past Surgical History:  Procedure Laterality Date  . ABDOMINAL HYSTERECTOMY    . PACEMAKER INSERTION    . right ear surgery    . TUBAL LIGATION      Family History  Problem Relation Age of Onset  . Breast cancer Sister [id number]   Social History:  reports that she has never smoked. She has never used smokeless tobacco.  She reports that she does not drink alcohol or use drugs.  Allergies: No Known Allergies  Medications:  I have reviewed the patient's current medications. Prior to Admission:  Medications Prior to Admission  Medication Sig Dispense Refill Last Dose  . acetaminophen (TYLENOL) 325 MG tablet Take 2 tablets (650 mg total) by mouth every 4 (four) hours as needed for mild pain (or temp > 37.5 C (99.5 F)).   Unknown at PRN  . amLODipine (NORVASC) 5 MG tablet Take 5 mg by mouth daily.   10/23/2018 at 0830  . brinzolamide (AZOPT) 1 % ophthalmic suspension Place 1 drop into both eyes 2 (two) times daily.   10/23/2018 at 0830  . dipyridamole-aspirin (AGGRENOX) 200-25 MG 12hr capsule Take 1 capsule by mouth 2 (two) times daily.   10/23/2018 at 0830  . docusate sodium (COLACE) 100 MG capsule Take 2 capsules (200 mg total) by mouth 2 (two) times daily. 10 capsule 0 10/23/2018 at 0830  . Fe Fum-FePoly-FA-Vit C-Vit B3 (INTEGRA F) 125-1 MG CAPS Take 1 capsule by mouth daily.   10/23/2018 at 0830  . gabapentin (NEURONTIN) 100 MG capsule Take 100 mg by mouth at bedtime.   10/22/2018 at 2000  . Ipratropium-Albuterol (COMBIVENT RESPIMAT) 20-100 MCG/ACT AERS respimat Inhale 1 puff into the lungs every 6 (six) hours. 1 Inhaler 5 10/23/2018 at 0830  . latanoprost (XALATAN) 0.005 % ophthalmic solution Place 1 drop into both eyes at bedtime.   10/22/2018 at 2000  . lisinopril (PRINIVIL,ZESTRIL) 20 MG tablet Take 20 mg by mouth daily.   10/23/2018 at 0800  . metFORMIN (GLUCOPHAGE) 500  MG tablet Take 500 mg by mouth 2 (two) times daily.   10/23/2018 at 0830  . metoprolol succinate (TOPROL-XL) 50 MG 24 hr tablet Take 50 mg by mouth daily.    10/23/2018 at 0830  . omeprazole (PRILOSEC) 10 MG capsule Take 10 mg by mouth daily.    10/23/2018 at 0630  . PARoxetine (PAXIL) 20 MG tablet Take 20 mg by mouth daily.   10/23/2018 at 0830  . polyethylene glycol (MIRALAX / GLYCOLAX) packet Take 17 g by mouth daily. 14 each 0 10/22/2018 at Unknown  time  . Propylene Glycol (SYSTANE BALANCE) 0.6 % SOLN Place 1 drop into both eyes 3 (three) times daily.   10/23/2018 at 0830  . simvastatin (ZOCOR) 20 MG tablet Take 20 mg by mouth at bedtime.    10/22/2018 at 2000  . tamsulosin (FLOMAX) 0.4 MG CAPS capsule Take 1 capsule (0.4 mg total) by mouth daily. 30 capsule  10/23/2018 at 0830  . Vitamin D, Ergocalciferol, (DRISDOL) 50000 units CAPS capsule Take 50,000 Units by mouth every Sunday.    10/22/2018 at Unknown time   Scheduled: . amLODipine  5 mg Oral Daily  . aspirin  325 mg Oral Daily  . brinzolamide  1 drop Both Eyes BID  . docusate sodium  200 mg Oral BID  . enoxaparin (LOVENOX) injection  30 mg Subcutaneous Q24H  . ferrous fumarate-b12-vitamic C-folic acid  1 capsule Oral Daily  . gabapentin  100 mg Oral QHS  . insulin aspart  0-9 Units Subcutaneous TID WC  . latanoprost  1 drop Both Eyes QHS  . metoprolol succinate  50 mg Oral Daily  . pantoprazole  40 mg Oral Daily  . PARoxetine  20 mg Oral Daily  . polyethylene glycol  17 g Oral Daily  . polyvinyl alcohol  1 drop Both Eyes TID  . simvastatin  20 mg Oral QHS  . tamsulosin  0.4 mg Oral Daily    ROS: History obtained from the patient   General ROS: negative for - chills, fatigue, fever, night sweats, weight gain or weight loss Psychological ROS: negative for - behavioral disorder, hallucinations, memory difficulties, mood swings or suicidal ideation Ophthalmic ROS: negative for - blurry vision, double vision, eye pain or loss of vision ENT ROS: negative for - epistaxis, nasal discharge, oral lesions, sore throat, tinnitus or vertigo Allergy and Immunology ROS: negative for - hives or itchy/watery eyes Hematological and Lymphatic ROS: negative for - bleeding problems, bruising or swollen lymph nodes Endocrine ROS: negative for - galactorrhea, hair pattern changes, polydipsia/polyuria or temperature intolerance Respiratory ROS: negative for - cough, hemoptysis, shortness of breath  or wheezing Cardiovascular ROS: negative for - chest pain, dyspnea on exertion, edema or irregular heartbeat Gastrointestinal ROS: negative for - abdominal pain, diarrhea, hematemesis, nausea/vomiting or stool incontinence Genito-Urinary ROS: negative for - dysuria, hematuria, incontinence or urinary frequency/urgency Musculoskeletal ROS: negative for - joint swelling or muscular weakness Neurological ROS: as noted in HPI Dermatological ROS: negative for rash and skin lesion changes  Physical Examination: Blood pressure 119/71, pulse 75, temperature 98.6 F (37 C), temperature source Oral, resp. rate 18, height 5\' 2"  (1.575 m), weight 58.8 kg, SpO2 94 %.   HEENT-  Normocephalic, no lesions, without obvious abnormality.  Normal external eye and conjunctiva.  Normal TM's bilaterally.  Normal auditory canals and external ears. Normal external nose, mucus membranes and septum.  Normal pharynx. Cardiovascular- S1, S2 normal, pulses palpable throughout   Lungs- chest clear, no wheezing, rales, normal  symmetric air entry Abdomen- soft, non-tender; bowel sounds normal; no masses,  no organomegaly Extremities- no edema Lymph-no adenopathy palpable Musculoskeletal-no joint tenderness, deformity or swelling Skin-warm and dry, no hyperpigmentation, vitiligo, or suspicious lesions  Neurological Exam   Mental Status: Alert, oriented, thought content appropriate.  Speech fluent without evidence of aphasia.  Able to follow 3 step commands without difficulty. Attention span and concentration seemed appropriate  Cranial Nerves: II: Discs flat bilaterally; Visual fields grossly normal, pupils equal, round, reactive to light and accommodation III,IV, VI: ptosis not present, extra-ocular motions intact bilaterally V,VII: smile symmetric, facial light touch sensation intact VIII: hearing normal bilaterally IX,X: gag reflex present XI: bilateral shoulder shrug XII: midline tongue extension Motor: Right :   Upper extremity   5/5 Without pronator drift      Left: Upper extremity   3+/5 without pronator drift Right:   Lower extremity   5/5                                          Left: Lower extremity   1/5 Tone and bulk:normal tone throughout; no atrophy noted Sensory: Pinprick and light touch intact bilaterally Deep Tendon Reflexes: 2+ and symmetric throughout Plantars: Right: mute                              Left: mute Cerebellar: Finger-to-nose testing intact bilaterally. Heel to shin testing normal bilaterally Gait: not tested due to safety concerns  Data Reviewed  Laboratory Studies:  Basic Metabolic Panel: Recent Labs  Lab 10/23/18 1505 10/24/18 0431  NA 142 141  K 5.0 3.6  CL 106 108  CO2 25 26  GLUCOSE 129* 105*  BUN 20 19  CREATININE 1.25* 0.99  CALCIUM 9.4 9.0    Liver Function Tests: Recent Labs  Lab 10/23/18 1505  AST 24  ALT 19  ALKPHOS 56  BILITOT 0.7  PROT 7.5  ALBUMIN 3.7   No results for input(s): LIPASE, AMYLASE in the last 168 hours. No results for input(s): AMMONIA in the last 168 hours.  CBC: Recent Labs  Lab 10/23/18 1505 10/24/18 0431  WBC 7.2 5.7  NEUTROABS 2.8  --   HGB 10.3* 9.9*  HCT 32.2* 30.3*  MCV 95.5 95.6  PLT 397 363    Cardiac Enzymes: Recent Labs  Lab 10/23/18 1505  TROPONINI <0.03    BNP: Invalid input(s): POCBNP  CBG: Recent Labs  Lab 10/23/18 1506 10/24/18 0821  GLUCAP 107* 100*    Microbiology: Results for orders placed or performed during the hospital encounter of 10/23/18  MRSA PCR Screening     Status: None   Collection Time: 10/24/18  7:30 AM  Result Value Ref Range Status   MRSA by PCR NEGATIVE NEGATIVE Final    Comment:        The GeneXpert MRSA Assay (FDA approved for NASAL specimens only), is one component of a comprehensive MRSA colonization surveillance program. It is not intended to diagnose MRSA infection nor to guide or monitor treatment for MRSA infections. Performed at [facility], [address] [place], [place] [address]     Coagulation Studies: Recent Labs    10/23/18 1505  LABPROT 12.3  INR 0.92    Urinalysis:  Recent Labs  Lab 10/23/18 1954  COLORURINE YELLOW*  LABSPEC >1.046*  PHURINE 5.0  GLUCOSEU NEGATIVE  HGBUR NEGATIVE  BILIRUBINUR NEGATIVE  KETONESUR NEGATIVE  PROTEINUR NEGATIVE  NITRITE NEGATIVE  LEUKOCYTESUR LARGE*    Lipid Panel:    Component Value Date/Time   CHOL 166 10/23/2018 1508   TRIG 172 (H) 10/23/2018 1508   HDL 44 10/23/2018 1508   CHOLHDL 3.8 10/23/2018 1508   VLDL 34 10/23/2018 1508   LDLCALC 88 10/23/2018 1508    HgbA1C:  Lab Results  Component Value Date   HGBA1C 6.8 (H) 10/23/2018    Urine Drug Screen:      Component Value Date/Time   LABOPIA NONE DETECTED 10/23/2018 1954   LABOPIA NONE DETECTED 01/01/2018 2333   COCAINSCRNUR NONE DETECTED 10/23/2018 1954   LABBENZ NONE DETECTED 10/23/2018 1954   LABBENZ NONE DETECTED 01/01/2018 2333   AMPHETMU NONE DETECTED 10/23/2018 1954   AMPHETMU NONE DETECTED 01/01/2018 2333   THCU NONE DETECTED 10/23/2018 1954   THCU NONE DETECTED 01/01/2018 2333   LABBARB NONE DETECTED 10/23/2018 1954   LABBARB NONE DETECTED 01/01/2018 2333    Alcohol Level:  Recent Labs  Lab 10/23/18 1505  ETH <10    Other results: EKG: normal EKG, normal sinus rhythm, unchanged from previous tracings. Vent. rate 90 BPM PR interval * ms QRS duration 137 ms QT/QTc 388/475 ms P-R-T axes 59 -61 -27  Imaging: Ct Angio Head W Or Wo Contrast  Result Date: 10/23/2018 CLINICAL DATA:  Follow-up examination for acute stroke. EXAM: CT ANGIOGRAPHY HEAD AND NECK TECHNIQUE: Multidetector CT imaging of the head and neck was performed using the standard protocol during bolus administration of intravenous contrast. Multiplanar CT image reconstructions and MIPs were obtained to evaluate the vascular anatomy. Carotid stenosis measurements (when applicable) are obtained utilizing  NASCET criteria, using the distal internal carotid diameter as the denominator. CONTRAST:  60mL OMNIPAQUE IOHEXOL 350 MG/ML SOLN COMPARISON:  Prior head CT from earlier the same day. FINDINGS: CTA NECK FINDINGS Aortic arch: Visualized aortic arch of normal caliber with normal 3 vessel morphology. Moderate atheromatous plaque at the origin of the left subclavian artery without significant stenosis. No high-grade stenosis about the origin of the great vessels. Visualized subclavian arteries widely patent. Right carotid system: Right CCA patent from its origin to the bifurcation without stenosis. Calcified plaque at the right bifurcation/proximal right ICA with associated stenosis of up to approximately 40% by NASCET criteria. Right ICA otherwise widely patent to the skull base without stenosis, dissection, or occlusion. Left carotid system: Left CCA patent from its origin to the bifurcation without stenosis. Calcified plaque at the left bifurcation with associated stenosis of up to approximately 50% by NASCET criteria. Left ICA otherwise widely patent distally to the skull base without stenosis, dissection, or occlusion. Vertebral arteries: Both of the vertebral arteries arise from the subclavian arteries. Right vertebral artery dominant and widely patent to the vertebrobasilar junction. Left vertebral artery diffusely hypoplastic, and essentially occludes at the V3 segment, with no significant flow into the cranial vault. Skeleton: No acute osseous finding. No discrete lytic or blastic osseous lesions. Other neck: No other acute soft tissue abnormality within the neck. Left maxillary sinus retention cyst noted. Upper chest: Visualized upper chest demonstrates no acute finding. Partially visualized lungs are clear. Left-sided pacemaker/AICD noted. Review of the MIP images confirms the above findings CTA HEAD FINDINGS Anterior circulation: Calcified plaque within the petrous segments bilaterally with resultant mild  multifocal narrowing. Prominent carotid siphon atherosclerosis with resultant moderate stenoses bilaterally (up to approximately 50-70% bilaterally, slightly worse on the left). ICA  termini patent. A1 segments patent bilaterally. Normal anterior communicating artery. Anterior cerebral arteries demonstrate scattered atheromatous irregularity but are patent to their distal aspects without significant stenosis. Short-segment moderate stenosis at the origin of the right M1 segment. Right M1 other wise irregular but patent to its distal aspect. Normal right MCA bifurcation. Distal right MCA branches well perfused. Short-segment moderate stenosis at the origin of the left M1 (series 6, image 207). Left M1 widely patent distally. Distal left MCA branches well perfused. Distal small vessel atheromatous irregularity noted throughout the MCA branches bilaterally. Posterior circulation: Scattered atheromatous plaque within the proximal right V4 segment with up to moderate approximate 30-50% stenosis. Left vertebral occluded at the skull base and not seen. Posterior inferior cerebral arteries not visualized bilaterally. Tortuous with short-segment moderate tandem stenoses involving its proximal and mid aspect. Basilar patent distally to the basilar tip. Superior cerebral arteries patent bilaterally. Left PCA primarily supplied via the basilar. Right PCA supplied via a hypoplastic right P1 and prominent right posterior communicating artery. Venous sinuses: Patent. Anatomic variants: None significant. Delayed phase: No abnormal enhancement. Review of the MIP images confirms the above findings IMPRESSION: 1. Negative CTA for large vessel occlusion. 2. 40-50% atheromatous stenoses about the carotid bifurcations bilaterally, left worse than right. 3. Diffusely hypoplastic left vertebral artery, which occludes at its distal aspect prior to the skull base. Dominant right vertebral artery widely patent within the neck. 4. Advanced  carotid siphon atherosclerosis with associated moderate multifocal narrowing (approximately 50-70% bilaterally). 5. Short-segment moderate stenoses at the origin of the M1 segments bilaterally. 6. Moderate tandem proximal-mid basilar stenoses. Electronically Signed   By: Rise Mu M.D.   On: 10/23/2018 17:57   Ct Head Wo Contrast  Result Date: 10/23/2018 CLINICAL DATA:  85 year old female with left upper extremity and left lower extremity weakness with left-sided facial droop. History of infarcts and dementia. Initial encounter. EXAM: CT HEAD WITHOUT CONTRAST TECHNIQUE: Contiguous axial images were obtained from the base of the skull through the vertex without intravenous contrast. COMPARISON:  07/31/2018 head CT. FINDINGS: Brain: Infarct posterior limb right internal capsule new from the prior examination of indeterminate age. Prominent chronic microvascular changes. Global atrophy. No intracranial mass lesion noted on this unenhanced exam. Vascular: Prominent vascular calcifications. No definitive evidence of acute hyperdense vessel. Skull: No acute abnormality. Sinuses/Orbits: No acute orbital abnormality. Visualized paranasal sinuses clear. Other: Mastoid air cells and middle ear cavities are clear. IMPRESSION: 1. Infarct posterior limb right internal capsule new from the prior examination of indeterminate age. 2. Prominent chronic microvascular changes. 3. Global atrophy. 4. Prominent vascular calcifications. Electronically Signed   By: Lacy Duverney M.D.   On: 10/23/2018 15:34   Ct Angio Neck W Or Wo Contrast  Result Date: 10/23/2018 CLINICAL DATA:  Follow-up examination for acute stroke. EXAM: CT ANGIOGRAPHY HEAD AND NECK TECHNIQUE: Multidetector CT imaging of the head and neck was performed using the standard protocol during bolus administration of intravenous contrast. Multiplanar CT image reconstructions and MIPs were obtained to evaluate the vascular anatomy. Carotid stenosis  measurements (when applicable) are obtained utilizing NASCET criteria, using the distal internal carotid diameter as the denominator. CONTRAST:  60mL OMNIPAQUE IOHEXOL 350 MG/ML SOLN COMPARISON:  Prior head CT from earlier the same day. FINDINGS: CTA NECK FINDINGS Aortic arch: Visualized aortic arch of normal caliber with normal 3 vessel morphology. Moderate atheromatous plaque at the origin of the left subclavian artery without significant stenosis. No high-grade stenosis about the origin of the great vessels. Visualized  subclavian arteries widely patent. Right carotid system: Right CCA patent from its origin to the bifurcation without stenosis. Calcified plaque at the right bifurcation/proximal right ICA with associated stenosis of up to approximately 40% by NASCET criteria. Right ICA otherwise widely patent to the skull base without stenosis, dissection, or occlusion. Left carotid system: Left CCA patent from its origin to the bifurcation without stenosis. Calcified plaque at the left bifurcation with associated stenosis of up to approximately 50% by NASCET criteria. Left ICA otherwise widely patent distally to the skull base without stenosis, dissection, or occlusion. Vertebral arteries: Both of the vertebral arteries arise from the subclavian arteries. Right vertebral artery dominant and widely patent to the vertebrobasilar junction. Left vertebral artery diffusely hypoplastic, and essentially occludes at the V3 segment, with no significant flow into the cranial vault. Skeleton: No acute osseous finding. No discrete lytic or blastic osseous lesions. Other neck: No other acute soft tissue abnormality within the neck. Left maxillary sinus retention cyst noted. Upper chest: Visualized upper chest demonstrates no acute finding. Partially visualized lungs are clear. Left-sided pacemaker/AICD noted. Review of the MIP images confirms the above findings CTA HEAD FINDINGS Anterior circulation: Calcified plaque within  the petrous segments bilaterally with resultant mild multifocal narrowing. Prominent carotid siphon atherosclerosis with resultant moderate stenoses bilaterally (up to approximately 50-70% bilaterally, slightly worse on the left). ICA termini patent. A1 segments patent bilaterally. Normal anterior communicating artery. Anterior cerebral arteries demonstrate scattered atheromatous irregularity but are patent to their distal aspects without significant stenosis. Short-segment moderate stenosis at the origin of the right M1 segment. Right M1 other wise irregular but patent to its distal aspect. Normal right MCA bifurcation. Distal right MCA branches well perfused. Short-segment moderate stenosis at the origin of the left M1 (series 6, image 207). Left M1 widely patent distally. Distal left MCA branches well perfused. Distal small vessel atheromatous irregularity noted throughout the MCA branches bilaterally. Posterior circulation: Scattered atheromatous plaque within the proximal right V4 segment with up to moderate approximate 30-50% stenosis. Left vertebral occluded at the skull base and not seen. Posterior inferior cerebral arteries not visualized bilaterally. Tortuous with short-segment moderate tandem stenoses involving its proximal and mid aspect. Basilar patent distally to the basilar tip. Superior cerebral arteries patent bilaterally. Left PCA primarily supplied via the basilar. Right PCA supplied via a hypoplastic right P1 and prominent right posterior communicating artery. Venous sinuses: Patent. Anatomic variants: None significant. Delayed phase: No abnormal enhancement. Review of the MIP images confirms the above findings IMPRESSION: 1. Negative CTA for large vessel occlusion. 2. 40-50% atheromatous stenoses about the carotid bifurcations bilaterally, left worse than right. 3. Diffusely hypoplastic left vertebral artery, which occludes at its distal aspect prior to the skull base. Dominant right vertebral  artery widely patent within the neck. 4. Advanced carotid siphon atherosclerosis with associated moderate multifocal narrowing (approximately 50-70% bilaterally). 5. Short-segment moderate stenoses at the origin of the M1 segments bilaterally. 6. Moderate tandem proximal-mid basilar stenoses. Electronically Signed   By: Rise Mu M.D.   On: 10/23/2018 17:57    Assessment: 85 y.o. female presenting with complaints of worsening left side weakness and left facial droop.  CT head showed infarct in the posterior limb of the right internal capsule. Etiology likely small vessel disease. 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 No emergent large vessel occlusion noted.Patient previously evaluated by Vascular who signed off.Hemoglobin A1c 6.8, LDL 88.   Patient was on Aggrenox  prior to this event   Stroke Risk Factors - carotid stenosis, diabetes mellitus, family history, hyperlipidemia and hypertension  Plan: 1. Stop Aggrenox and start medical management with Aspirin 325 mg/day with intensive management of vascular risk factor to keep systolic BP (SBP) <140 mm Hg (161 mm Hg if diabetic). 2. Lipid management with goal low density lipoprotein (LDL) <70 mg/dl. 3. PT consult, OT consult, Speech consult 4. Echocardiogram 5. NPO until RN stroke swallow screen 6. Telemetry monitoring 7. Frequent neuro checks  This patient was staffed with Dr. Loretha Brasil, Doyle Askew who personally evaluated patient, reviewed documentation and agreed with assessment and plan of care as above.  Webb Silversmith, DNP, FNP-BC Board certified Nurse Practitioner Neurology Department   10/24/2018, 10:33 AM

## 2018-10-24 NOTE — Progress Notes (Signed)
*  PRELIMINARY RESULTS* Echocardiogram 2D Echocardiogram has been performed.  Cristela Blue 10/24/2018, 1:42 PM

## 2018-10-26 LAB — URINE CULTURE: Culture: >=100000 — AB

## 2019-11-08 IMAGING — CT CT ANGIO NECK
1 of 8 series · 6 of 33 positions shown · IV contrast (APPLIED)
Comparison: CT HEAD July 31, 2018 and carotid ultrasound
September 09, 2017.

CLINICAL DATA: Generalized weakness. History of stroke, dementia,
pacemaker.

EXAM:
CT ANGIOGRAPHY HEAD AND NECK
TECHNIQUE: Multidetector CT imaging of the head and neck was performed using
the standard protocol during bolus administration of intravenous
contrast. Multiplanar CT image reconstructions and MIPs were
obtained to evaluate the vascular anatomy. Carotid stenosis
measurements (when applicable) are obtained utilizing NASCET
criteria, using the distal internal carotid diameter as the
denominator.
CONTRAST:  75mL OMNIPAQUE IOHEXOL 350 MG/ML SOLN

[Series 6: ax thin · axial · 0.54mm/px · z∈[-261,-36]mm · 6 of 317 slices shown]
[im 46/317  soft-tissue]
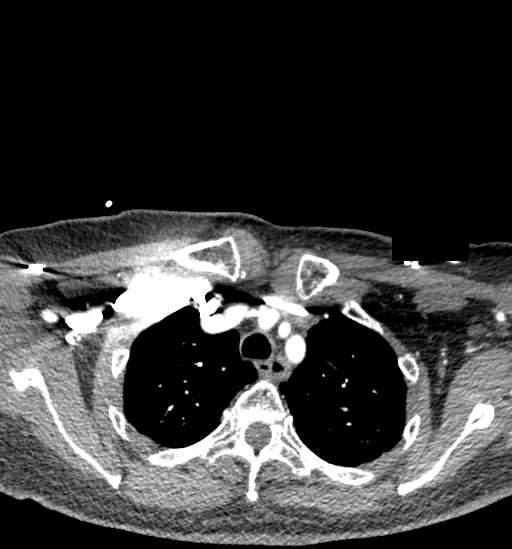
[im 91/317  bone]
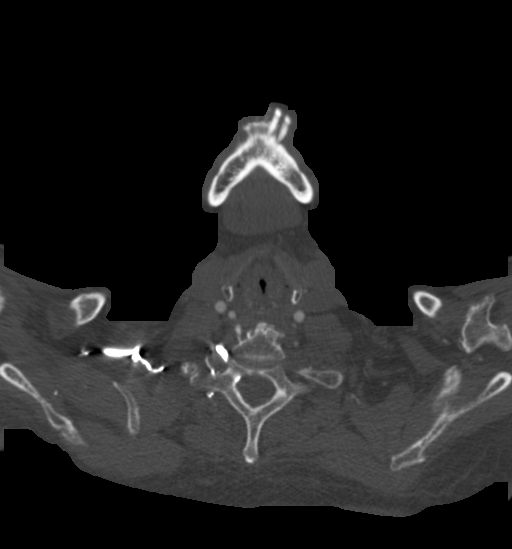
[im 136/317  soft-tissue]
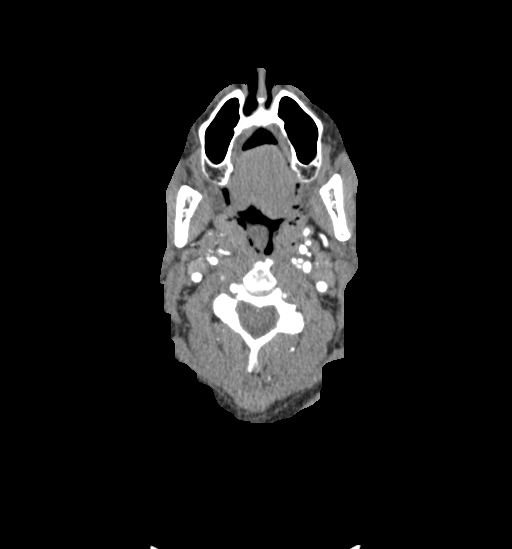
[im 181/317  bone]
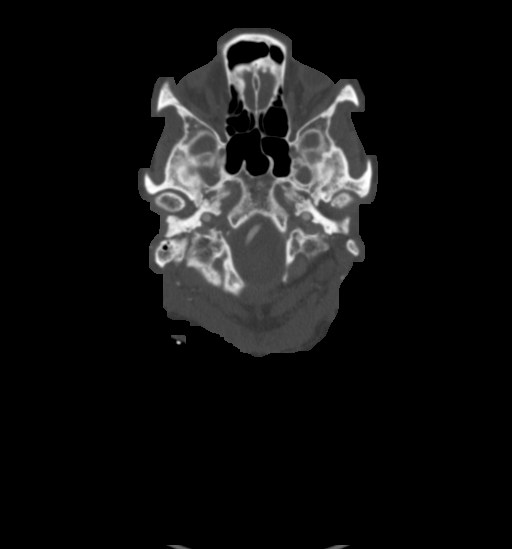
[im 226/317  soft-tissue]
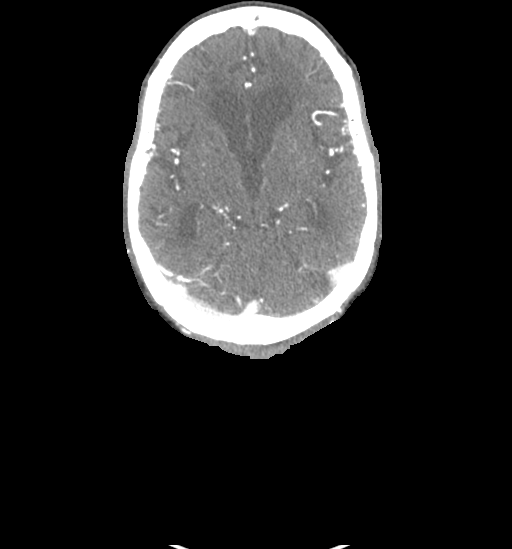
[im 271/317  bone]
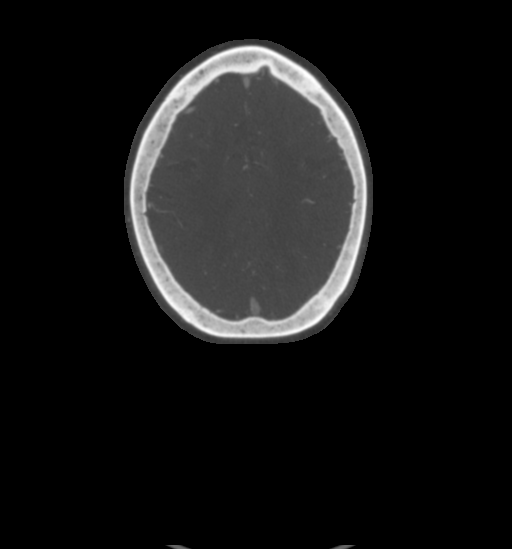

[6 of 33 positions shown; findings below may reference images not displayed]

FINDINGS: CTA NECK FINDINGS:

AORTIC ARCH: Normal appearance of the thoracic arch, normal branch
pattern. Mild calcific atherosclerosis aortic arch. The origins of
the innominate, left Common carotid artery and subclavian artery are
widely patent.

RIGHT CAROTID SYSTEM: Common carotid artery is patent. Mild calcific
atherosclerosis of the carotid bifurcation without hemodynamically
significant stenosis by NASCET criteria. Patent internal carotid
artery.

LEFT CAROTID SYSTEM: Common carotid artery is patent. Mild calcific
atherosclerosis of the carotid bifurcation without hemodynamically
significant stenosis by NASCET criteria. Patent internal carotid
artery.

VERTEBRAL ARTERIES:RIGHT vertebral artery is dominant. Severe
luminal irregularity LEFT vertebral artery with loss of contrast
opacification LEFT V3 segment. No discrete dissection flap or
pseudoaneurysm.

SKELETON: No acute osseous process though bone windows have not been
submitted.

OTHER NECK: Soft tissues of the neck are nonacute though, not
tailored for evaluation.

UPPER CHEST: Included lung apices are clear. No superior mediastinal
lymphadenopathy. LEFT cardiac pacemaker via subclavian venous
approach.

CTA HEAD FINDINGS:

ANTERIOR CIRCULATION: Patent cervical internal carotid arteries,
petrous, cavernous and supra clinoid internal carotid arteries.
Severe calcific atherosclerosis carotid siphons. Patent anterior
communicating artery. Patent anterior and middle cerebral arteries.
Moderate stenosis RIGHT A2 segment. Moderate bilateral M1 segment
stenosis. Moderate stenosis LEFT M2 segment. Moderate stenosis RIGHT
M3 segment.

No large vessel occlusion, significant stenosis, contrast
extravasation or aneurysm.

POSTERIOR CIRCULATION: Complete loss of LEFT vertebral artery
contrast opacification. Patent RIGHT vertebral arteries mild
calcific atherosclerosis. Moderate basilar artery tandem stenosis.
Patent posterior cerebral arteries with moderate tandem stenosis.

No large vessel occlusion, significant stenosis, contrast
extravasation or aneurysm.

VENOUS SINUSES: Major dural venous sinuses are patent though not
tailored for evaluation on this angiographic examination.

ANATOMIC VARIANTS: None.

DELAYED PHASE: No abnormal intracranial enhancement.

MIP images reviewed.
IMPRESSION: CTA NECK:

1.  LEFT vertebral artery dissection, occluded at V3 segment.
2. No hemodynamically significant stenosis internal carotid
arteries.

CTA HEAD:

1. Occluded LEFT vertebral artery without reconstitution.
2. No emergent large vessel occlusion. Multifocal moderate cerebral
artery stenosis compatible with atherosclerosis.

Acute findings discussed with and reconfirmed by Dr.ADILIO LAMPERT
on 07/31/2018 at [DATE].

Aortic Atherosclerosis ([id number].G).

## 2020-10-08 ENCOUNTER — Other Ambulatory Visit: Payer: Self-pay

## 2020-10-08 ENCOUNTER — Non-Acute Institutional Stay: Payer: Self-pay | Admitting: Primary Care

## 2020-10-10 ENCOUNTER — Non-Acute Institutional Stay: Payer: Medicare Other | Admitting: Primary Care

## 2020-10-10 ENCOUNTER — Other Ambulatory Visit: Payer: Self-pay

## 2020-10-13 ENCOUNTER — Non-Acute Institutional Stay: Payer: Medicare Other | Admitting: Primary Care

## 2020-10-13 ENCOUNTER — Other Ambulatory Visit: Payer: Self-pay

## 2020-10-13 DIAGNOSIS — F0390 Unspecified dementia without behavioral disturbance: Secondary | ICD-10-CM

## 2020-10-13 DIAGNOSIS — R531 Weakness: Secondary | ICD-10-CM

## 2020-10-13 DIAGNOSIS — Z8673 Personal history of transient ischemic attack (TIA), and cerebral infarction without residual deficits: Secondary | ICD-10-CM

## 2020-10-13 DIAGNOSIS — Z515 Encounter for palliative care: Secondary | ICD-10-CM

## 2020-10-13 NOTE — Progress Notes (Signed)
[facility] Consult Note Telephone: [phone]  Fax: [phone]     Date of encounter: 10/13/20 PATIENT NAME: Jessica Gardner [address] [place] [address] [phone] (home)  DOB: 1929-10-25 MRN: [id number]  PRIMARY CARE PROVIDER:    Gennie Alma, MD,  [place] [address] [place]:   Jessica Gardner, [address] [place],  [place] [address] [phone]  RESPONSIBLE PARTY:   Extended Emergency Contact Information Primary Emergency Contact: Jessica Gardner Address: [place], [place] [address] Johnnette Litter of [place] Phone: [phone] Relation: Daughter Secondary Emergency Contact: Jessica Gardner Mobile Phone: [phone] Relation: Other Interpreter needed? No  I met face to face with patient in facility. Palliative Care was asked to follow this patient by consultation request of Jessica Alma, MD to help address advance care planning and goals of care. This is the initial visit.   ASSESSMENT AND RECOMMENDATIONS:   1. Advance Care Planning/Goals of Care: Goals include to maximize quality of life and symptom management. Our advance care planning conversation included a discussion about:     The value and importance of advance care planning   Experiences with loved ones who have been seriously ill or have died   Exploration of personal, cultural or spiritual beliefs that might influence medical decisions   Exploration of goals of care in the event of a sudden injury or illness   Identification and preparation of a healthcare agent   Review of an  advance directive  Document- Pr has DNR  Decision to  de-escalate disease focused treatments due to poor prognosis.  Family elects hospice services. Jessica Gardner states she has seen the decline and she and family would like comfort measures. Pt has 'seen' her father and has stated she's  ready to go to heaven.Discussed with family representative who has decisionmaking rights.   States POA works and cannot speak during working hours due to being out too much from work. Jessica [place], [place], states the family wants hospice and thought she was going to be moved to a hospice area a few weeks ago. Clarified these services start in place in her own room. I will f/u with referral process per family request.   I spent 20 minutes providing this consultation,  from 1000 to 1020. More than 50% of the time in this consultation was spent in counseling and care coordination. __________________________________________________________________  2. Symptom Management:   Dysphagia: Increased. Has had fluids due to dehydration. Administer PO with caution. According to family decision maker, family wants to pursue comfort measures. Dysphagia is likely due to EOL process.  Weight loss: Current recorded 25 but 58 month old. 1  Year ago weight was 129 lbs. 10/21 = 118 lbs. This is  16% in a year. Cachectic, poor intake, staff reports sips and bites and increased aspiration with eating.  Skin integrity: stage 2 ulcer on sacrum Rx by SNF staff. Comfort measures for would care recommended due to poor po intake and advancing disease decompensation. Not a goal to  heal wound d/t poor po intake and advancing disease.   3. Follow up Palliative Care Visit: Refer to hospice for admission  4. Family /Caregiver/Community Supports: Jessica Gardner is former caregiver and can take information/give messages. Daughter is Levada Gardner. Pt lives in [place].  5. Cognitive / Functional decline: Alert, non verbal, Bed bound. Completely dependent in all care.   CODE STATUS:  DNR  PPS: 20%  HOSPICE ELIGIBILITY/DIAGNOSIS: yes/ end stage dementia, CAD, CVA sequelae  Subjective:  CHIEF COMPLAINT: dysphagia, debility  HISTORY OF PRESENT ILLNESS:  Jessica Gardner is a 87 y.o. year old female  with PMH significant for CAD, h/o CVA with residual  hemiparesis with contractures, h/o vertebral artery dissection, HTN, HLD, DM type II, OSA, anemia, peripheral neuropathy, depression and dementia. She now presents with EOL s/sx, in the context the above, and  including dysphagia, anorexia, poor intake, aspiration, recent dehydration and delirium (terminal). Family wants to embrace comfort measures per spokesperson Jessica Gardner, and want to cease aggressive treatments and keep pt comfortable. Referral initiated  for in- place hosp[facility] services at [facility].  We are asked to consult around advance care planning and complex medical decision making.    Review and summarization of old Epic records shows or history from other than patient. Review or lab tests, radiology,  or medicine Viewed for recent albumin, may be in SNF lab reports Review of case with family member Jessica Gardner Decision for obtaining previous records =labs from SNF  History obtained from review of EMR, discussion with primary team, and  interview with family, caregiver  and/or Jessica Gardner. Records reviewed and summarized above.   CURRENT PROBLEM LIST:  Patient Active Problem List   Diagnosis Date Noted  . Stroke (cerebrum) ([place]) 10/23/2018  . CAD (coronary artery disease) 07/31/2018  . Vertebral artery dissection ([place]) 07/31/2018  . Presyncope 01/02/2018  . UTI (urinary tract infection) 01/02/2018  . Dehydration 01/02/2018  . HTN (hypertension) 01/02/2018  . Hyperlipidemia 01/02/2018  . Postural dizziness with presyncope 01/02/2018  . Hypokalemia 01/02/2018  . Left leg weakness 09/09/2017  . Diabetes ([place]) 09/09/2017  . Dementia ([place]) 09/09/2017  . History of stroke 09/09/2017  . Acute bronchitis 09/18/2016  . Elevated troponin 09/18/2016  . Anemia 09/18/2016  . Renal insufficiency 09/18/2016  . Generalized weakness 09/18/2016  . Chest pain 09/17/2016   PAST MEDICAL HISTORY:  Active Ambulatory Problems    Diagnosis Date Noted  . Chest pain 09/17/2016  . Acute bronchitis 09/18/2016   . Elevated troponin 09/18/2016  . Anemia 09/18/2016  . Renal insufficiency 09/18/2016  . Generalized weakness 09/18/2016  . Left leg weakness 09/09/2017  . Diabetes ([place]) 09/09/2017  . Dementia ([place]) 09/09/2017  . History of stroke 09/09/2017  . Presyncope 01/02/2018  . UTI (urinary tract infection) 01/02/2018  . Dehydration 01/02/2018  . HTN (hypertension) 01/02/2018  . Hyperlipidemia 01/02/2018  . Postural dizziness with presyncope 01/02/2018  . Hypokalemia 01/02/2018  . CAD (coronary artery disease) 07/31/2018  . Vertebral artery dissection ([place]) 07/31/2018  . Stroke (cerebrum) ([place]) 10/23/2018   Resolved Ambulatory Problems    Diagnosis Date Noted  . No Resolved Ambulatory Problems   Past Medical History:  Diagnosis Date  . Diabetes mellitus   . MI (myocardial infarction) ([place])   . Pacemaker   . Stroke [facility])    SOCIAL HX:  Social History   Tobacco Use  . Smoking status: Never Smoker  . Smokeless tobacco: Never Used  Substance Use Topics  . Alcohol use: No   FAMILY HX:  Family History  Problem Relation Age of Onset  . Breast cancer Sister 102      ALLERGIES: No Known Allergies   PERTINENT MEDICATIONS:  Outpatient Encounter Medications as of 10/13/2020  Medication Sig  . acetaminophen (TYLENOL) 325 MG tablet Take 2 tablets (650 mg total) by mouth every 4 (four) hours as needed for mild pain (or temp >  37.5 C (99.5 F)).  Marland Kitchen albuterol (VENTOLIN HFA) 108 (90 Base) MCG/ACT inhaler Inhale 2 puffs into the lungs every 6 (six) hours as needed for wheezing or shortness of breath. With spacer. Wait 1 min between puffs.  Marland Kitchen amLODipine (NORVASC) 5 MG tablet Take 5 mg by mouth daily.  Marland Kitchen aspirin 325 MG tablet Take 1 tablet (325 mg total) by mouth daily.  . brinzolamide (AZOPT) 1 % ophthalmic suspension Place 1 drop into both eyes 2 (two) times daily.  . Cholecalciferol (VITAMIN D3) 50 MCG (2000 UT) TABS Take 1 tablet by mouth daily.  . divalproex (DEPAKOTE) 125 MG DR tablet  Take 125 mg by mouth at bedtime.  Marland Kitchen escitalopram (LEXAPRO) 5 MG tablet Take 5 mg by mouth daily.  . Fe Fum-FePoly-FA-Vit C-Vit B3 (INTEGRA F) 125-1 MG CAPS Take 1 capsule by mouth daily.  Marland Kitchen gabapentin (NEURONTIN) 100 MG capsule Take 100 mg by mouth at bedtime.  . Ipratropium-Albuterol (COMBIVENT RESPIMAT) 20-100 MCG/ACT AERS respimat Inhale 1 puff into the lungs every 6 (six) hours.  Marland Kitchen latanoprost (XALATAN) 0.005 % ophthalmic solution Place 1 drop into both eyes at bedtime.  Marland Kitchen lisinopril (PRINIVIL,ZESTRIL) 20 MG tablet Take 20 mg by mouth daily.  Marland Kitchen LORazepam (ATIVAN) 0.5 MG tablet Take 0.5 mg by mouth at bedtime.  . melatonin 3 MG TABS tablet Take 3 mg by mouth at bedtime.  . metFORMIN (GLUCOPHAGE) 500 MG tablet Take 500 mg by mouth 2 (two) times daily.  . metoprolol succinate (TOPROL-XL) 50 MG 24 hr tablet Take 50 mg by mouth daily.   . polyethylene glycol (MIRALAX / GLYCOLAX) packet Take 17 g by mouth daily.  Marland Kitchen Propylene Glycol 0.6 % SOLN Place 1 drop into both eyes 3 (three) times daily.  . sennosides-docusate sodium (SENOKOT-S) 8.6-50 MG tablet Take 1 tablet by mouth at bedtime.  . tamsulosin (FLOMAX) 0.4 MG CAPS capsule Take 1 capsule (0.4 mg total) by mouth daily.  . traMADol (ULTRAM) 50 MG tablet Take 50 mg by mouth every 8 (eight) hours as needed for moderate pain.  Marland Kitchen docusate sodium (COLACE) 100 MG capsule Take 2 capsules (200 mg total) by mouth 2 (two) times daily. (Patient not taking: Reported on 10/13/2020)  . omeprazole (PRILOSEC) 10 MG capsule Take 10 mg by mouth daily.   Marland Kitchen PARoxetine (PAXIL) 20 MG tablet Take 20 mg by mouth daily. (Patient not taking: Reported on 10/13/2020)  . simvastatin (ZOCOR) 20 MG tablet Take 20 mg by mouth at bedtime.  (Patient not taking: Reported on 10/13/2020)  . Vitamin D, Ergocalciferol, (DRISDOL) 50000 units CAPS capsule Take 50,000 Units by mouth every Sunday.  (Patient not taking: Reported on 10/13/2020)   No facility-administered encounter medications  on file as of 10/13/2020.    Objective: ROS/staff/family   General: ill appearing  ENMT: endorses  dysphagia Cardiovascular: denies chest pain Pulmonary: denies  cough, denies increased SOB Abdomen: endorses poor to no  appetite, denies constipation, endorses incontinence of bowel GU: denies dysuria, endorses incontinence of urine MSK:  endorses ROM limitations, no falls reported Skin: sacral stage 2 Neurological: endorses advancing  weakness, endorses occ  pain, denies insomnia Psych: Endorses withdrawn, confused mood, seeing dead relatives Heme/lymph/immuno: denies bruises, abnormal bleeding  Physical Exam: Current and past weights:108 lbs, 16% loss in 1 year Constitutional:  NAD General: frail  And  Ill appearing, thin EYES: anicteric sclera, lids intact, no discharge  ENMT: intact hearing,oral mucous membranes dry, lower dentition intact,stained CV: S1S2, RRR, no LE edema Pulmonary:  LCTA, no increased work of breathing, no cough, no audible wheezes Abdomen: intake < 10%, no ascites GU: deferred MSK: severe  sarcopenia, decreased ROM in all extremities, non ambulatory Skin: warm and dry, reported sacral PI Neuro: increased generalized weakness, severe cognitive impairment Psych: flat affect, A and O x 1 Hem/lymph/immuno: no widespread bruising   Thank you for the opportunity to participate in the care of Ms. Duer.  The palliative care team will continue to follow. Please call our office at [phone] if we can be of additional assistance.  Jason Coop, NP , DNP, MPH, AGPCNP-BC, ACHPN   COVID-19 PATIENT SCREENING TOOL  Person answering questions: _______staff____________   1.  Is the patient or any family member in the home showing any signs or symptoms regarding respiratory infection?                  Person with Symptom  ______________na___________ a. Fever/chills/headache                                                        Yes___ No__X_             b. Shortness of breath                                                            Yes___ No__X_           c. Cough/congestion                                               Yes___  No__X_          d. Muscle/Body aches/pains                                                   Yes___ No__X_         e. Gastrointestinal symptoms (diarrhea,nausea)             Yes___ No__X_         f. Sudden loss of smell or taste      Yes___ No__X_        2. Within the past 10 days, has anyone living in the home had any contact with someone with or under investigation for COVID-19?    Yes___ No__X__   Person __________________

## 2020-10-14 ENCOUNTER — Other Ambulatory Visit
Admission: RE | Admit: 2020-10-14 | Discharge: 2020-10-14 | Disposition: A | Discharge status: Home / Self Care | Payer: Medicare Other | Source: Ambulatory Visit | Attending: General Practice | Admitting: General Practice

## 2020-10-14 DIAGNOSIS — E869 Volume depletion, unspecified: Secondary | ICD-10-CM | POA: Insufficient documentation

## 2020-10-14 DIAGNOSIS — D72829 Elevated white blood cell count, unspecified: Secondary | ICD-10-CM | POA: Insufficient documentation

## 2020-10-14 LAB — URINALYSIS, COMPLETE (UACMP) WITH MICROSCOPIC
Bilirubin Urine: NEGATIVE
Glucose, UA: NEGATIVE mg/dL
Hgb urine dipstick: NEGATIVE
Ketones, ur: NEGATIVE mg/dL
Leukocytes,Ua: NEGATIVE
Nitrite: NEGATIVE
Protein, ur: 30 mg/dL — AB
Specific Gravity, Urine: 1.018 (ref 1.005–1.030)
pH: 5 (ref 5.0–8.0)

## 2020-10-15 ENCOUNTER — Other Ambulatory Visit: Payer: Self-pay

## 2020-10-15 ENCOUNTER — Non-Acute Institutional Stay: Payer: Medicare Other | Admitting: Primary Care

## 2020-10-15 DIAGNOSIS — F0390 Unspecified dementia without behavioral disturbance: Secondary | ICD-10-CM

## 2020-10-15 DIAGNOSIS — R531 Weakness: Secondary | ICD-10-CM

## 2020-10-15 DIAGNOSIS — Z515 Encounter for palliative care: Secondary | ICD-10-CM

## 2020-10-15 NOTE — Progress Notes (Signed)
[facility] Consult Note Telephone: [phone]  Fax: [phone]     Date of encounter: 10/15/20 PATIENT NAME: Jessica Gardner [address] [place] [address] [phone] (home)  DOB: Mar 30, 1930 MRN: [id number]  PRIMARY CARE PROVIDER:    Gennie Alma, MD,  [place] [address] [place]:   Jessica Gardner, [place],  [place] [address] [phone]  RESPONSIBLE PARTY:   Extended Emergency Contact Information Primary Emergency Contact: Gardner,Jessica Address: [place], [place] [address] [place] of [place] Phone: [phone] Relation: Daughter Secondary Emergency Contact: Jessica Gardner Mobile Phone: [phone] Relation: Other Interpreter needed? No  I met face to face with patient in facility. Palliative Care was asked to follow this patient by consultation request of Jessica Alma, MD to help address advance care planning and goals of care. This is a follow up   visit.   ASSESSMENT AND RECOMMENDATIONS:   1. Advance Care Planning/Goals of Care: Goals include to maximize quality of life and symptom management. Corrected DNR left in chart. Previous one had been incomplete. Uploaded to [facility].  2. Symptom Management:   Patient continues to decline awaiting hospice admission.  PCP has given comfort care orders for roxanol and ativan to alleviate pain and agitation. Patient also receiving nebulizer treatment. Unresponsive. Pt is warm and appears to be comfortable, PAINAD 0/10.  3. Follow up Palliative Care Visit: For hospice admission.  4. Family /Caregiver/Community Supports: Daughter is POA. Lives in [place].  5. Cognitive / Functional decline: Non responsive. Total care.  I spent 15 minutes providing this consultation,  from 1300 to 1315. More than 50% of the time in this consultation was spent in counseling and care  coordination.  CODE STATUS: DNR  PPS: 10%  HOSPICE ELIGIBILITY/DIAGNOSIS: yes  Subjective:  CHIEF COMPLAINT: comfort care  HISTORY OF PRESENT ILLNESS:  Jessica Gardner is a 87 y.o. year old female  with end stage dementia, actively dying. For hospice admission. Comfort measures in place. DNR uploaded to [facility] and is on chart.   We are asked to consult around advance care planning and complex medical decision making.    Review and summarization of old Epic records shows or history from other than patient . History obtained from review of EMR, discussion with primary team, and  interview with family, caregiver  and/or Jessica Gardner. Records reviewed and summarized above.   CURRENT PROBLEM LIST:  Patient Active Problem List   Diagnosis Date Noted  . 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 CAD (coronary artery disease) 07/31/2018  . 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 Presyncope 01/02/2018  . UTI (urinary tract infection) 01/02/2018  . Dehydration 01/02/2018  . HTN (hypertension) 01/02/2018  . Hyperlipidemia 01/02/2018  . Postural dizziness with presyncope 01/02/2018  . Hypokalemia 01/02/2018  . Left leg weakness 09/09/2017  . Diabetes ([place]) 09/09/2017  . Dementia ([place]) 09/09/2017  . History of stroke 09/09/2017  . Acute bronchitis 09/18/2016  . Elevated troponin 09/18/2016  . Anemia 09/18/2016  . Renal insufficiency 09/18/2016  . Generalized weakness 09/18/2016  . Chest pain 09/17/2016   PAST MEDICAL HISTORY:  Active Ambulatory Problems    Diagnosis Date Noted  . Chest pain 09/17/2016  . Acute bronchitis 09/18/2016  . Elevated troponin 09/18/2016  . Anemia 09/18/2016  . Renal insufficiency 09/18/2016  . Generalized weakness 09/18/2016  . Left leg  weakness 09/09/2017  . Diabetes ([place]) 09/09/2017  . Dementia ([place]) 09/09/2017  . History of stroke 09/09/2017  . Presyncope 01/02/2018  . UTI (urinary tract infection) 01/02/2018  . Dehydration 01/02/2018  .  HTN (hypertension) 01/02/2018  . Hyperlipidemia 01/02/2018  . Postural dizziness with presyncope 01/02/2018  . Hypokalemia 01/02/2018  . CAD (coronary artery disease) 07/31/2018  . 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 No Resolved Ambulatory Problems   Past Medical History:  Diagnosis Date  . Diabetes mellitus   . MI (myocardial infarction) ([place])   . Pacemaker   . Stroke [facility])    SOCIAL HX:  Social History   Tobacco Use  . Smoking status: Never Smoker  . Smokeless tobacco: Never Used  Substance Use Topics  . Alcohol use: No   FAMILY HX:  Family History  Problem Relation Age of Onset  . Breast cancer Sister 36      ALLERGIES: No Known Allergies   PERTINENT MEDICATIONS:  Outpatient Encounter Medications as of 10/15/2020  Medication Sig  . acetaminophen (TYLENOL) 325 MG tablet Take 2 tablets (650 mg total) by mouth every 4 (four) hours as needed for mild pain (or temp > 37.5 C (99.5 F)).  [place] Kitchen albuterol (VENTOLIN HFA) 108 (90 Base) MCG/ACT inhaler Inhale 2 puffs into the lungs every 6 (six) hours as needed for wheezing or shortness of breath. With spacer. Wait 1 min between puffs.  Marland Kitchen amLODipine (NORVASC) 5 MG tablet Take 5 mg by mouth daily.  Marland Kitchen aspirin 325 MG tablet Take 1 tablet (325 mg total) by mouth daily.  . brinzolamide (AZOPT) 1 % ophthalmic suspension Place 1 drop into both eyes 2 (two) times daily.  . Cholecalciferol (VITAMIN D3) 50 MCG (2000 UT) TABS Take 1 tablet by mouth daily.  . divalproex (DEPAKOTE) 125 MG DR tablet Take 125 mg by mouth at bedtime.  . docusate sodium (COLACE) 100 MG capsule Take 2 capsules (200 mg total) by mouth 2 (two) times daily. (Patient not taking: Reported on 10/13/2020)  . escitalopram (LEXAPRO) 5 MG tablet Take 5 mg by mouth daily.  . Fe Fum-FePoly-FA-Vit C-Vit B3 (INTEGRA F) 125-1 MG CAPS Take 1 capsule by mouth daily.  Marland Kitchen gabapentin  (NEURONTIN) 100 MG capsule Take 100 mg by mouth at bedtime.  . Ipratropium-Albuterol (COMBIVENT RESPIMAT) 20-100 MCG/ACT AERS respimat Inhale 1 puff into the lungs every 6 (six) hours.  Marland Kitchen latanoprost (XALATAN) 0.005 % ophthalmic solution Place 1 drop into both eyes at bedtime.  Marland Kitchen lisinopril (PRINIVIL,ZESTRIL) 20 MG tablet Take 20 mg by mouth daily.  Marland Kitchen LORazepam (ATIVAN) 0.5 MG tablet Take 0.5 mg by mouth at bedtime.  . melatonin 3 MG TABS tablet Take 3 mg by mouth at bedtime.  . metFORMIN (GLUCOPHAGE) 500 MG tablet Take 500 mg by mouth 2 (two) times daily.  . metoprolol succinate (TOPROL-XL) 50 MG 24 hr tablet Take 50 mg by mouth daily.   Marland Kitchen omeprazole (PRILOSEC) 10 MG capsule Take 10 mg by mouth daily.   Marland Kitchen PARoxetine (PAXIL) 20 MG tablet Take 20 mg by mouth daily. (Patient not taking: Reported on 10/13/2020)  . polyethylene glycol (MIRALAX / GLYCOLAX) packet Take 17 g by mouth daily.  Marland Kitchen Propylene Glycol 0.6 % SOLN Place 1 drop into both eyes 3 (three) times daily.  . sennosides-docusate sodium (SENOKOT-S) 8.6-50 MG tablet Take 1 tablet by mouth at bedtime.  . simvastatin (ZOCOR)  20 MG tablet Take 20 mg by mouth at bedtime.  (Patient not taking: Reported on 10/13/2020)  . tamsulosin (FLOMAX) 0.4 MG CAPS capsule Take 1 capsule (0.4 mg total) by mouth daily.  . traMADol (ULTRAM) 50 MG tablet Take 50 mg by mouth every 8 (eight) hours as needed for moderate pain.  . Vitamin D, Ergocalciferol, (DRISDOL) 50000 units CAPS capsule Take 50,000 Units by mouth every Sunday.  (Patient not taking: Reported on 10/13/2020)   No facility-administered encounter medications on file as of 10/15/2020.    Objective: ROS/staff  General: NAD ENMT: endorse aphagia Pulmonary: denies cough, denies increased SOB Abdomen: NPO, endorses constipation, endorses incontinence of bowel GU: denies dysuria, endorses incontinence of urine MSK:  endorses ROM limitations, no falls reported Skin: denies rashes or  wounds Neurological: endorses weakness, denies pain, denies insomnia Psych: unresponsive Heme/lymph/immuno: denies bruises, abnormal bleeding  Physical Exam: Current and past weights: 108 lbs. Constitutional: NAD General: frail appearing, thin EYES: anicteric sclera, lids intact, no discharge  ENMT: intact hearing,oral mucous membranes dry CV:  RRR, no LE edema Pulmonary: no increased work of breathing, no cough, no audible wheezes, room air Abdomen: npo,  no ascites GU: deferred MSK: severe sarcopenia, slight  contractures of LE, non ambulatory Skin: warm and dry, no rashes or wounds on visible skin Neuro: obtunded Psych: non-anxious affect Hem/lymph/immuno: no widespread bruising   Thank you for the opportunity to participate in the care of Ms. Sweigert.  The palliative care team will continue to follow. Please call our office at [phone] if we can be of additional assistance.  Jason Coop, NP , DNP, MPH, AGPCNP-BC, ACHPN   COVID-19 PATIENT SCREENING TOOL  Person answering questions: _______staff____________   1.  Is the patient or any family member in the home showing any signs or symptoms regarding respiratory infection?                  Person with Symptom  ______________na___________ a. Fever/chills/headache                                                        Yes___ No__X_            b. Shortness of breath                                                            Yes___ No__X_           c. Cough/congestion                                               Yes___  No__X_          d. Muscle/Body aches/pains                                                   Yes___ No__X_  e. Gastrointestinal symptoms (diarrhea,nausea)             Yes___ No__X_         f. Sudden loss of smell or taste      Yes___ No__X_        2. Within the past 10 days, has anyone living in the home had any contact with someone with or under investigation for COVID-19?    Yes___  No__X__   Person __________________

## 2020-10-17 ENCOUNTER — Telehealth: Payer: Self-pay | Admitting: Primary Care

## 2020-10-17 LAB — URINE CULTURE: Culture: 10000 — AB

## 2020-10-17 NOTE — Telephone Encounter (Signed)
Patient passed away on 10-19-20. Nursing home apprised this Clinical research associate.

## 2020-11-11 DEATH — deceased
